# Patient Record
Sex: Female | Born: 1980 | Race: Black or African American | Hispanic: No | Marital: Married | Smoking: Never smoker
Health system: Southern US, Community
[De-identification: ages and names within clinical notes are randomized; demographics above are authoritative.]

## PROBLEM LIST (undated history)

## (undated) DIAGNOSIS — D573 Sickle-cell trait: Secondary | ICD-10-CM

## (undated) DIAGNOSIS — G43909 Migraine, unspecified, not intractable, without status migrainosus: Secondary | ICD-10-CM

## (undated) DIAGNOSIS — K589 Irritable bowel syndrome without diarrhea: Secondary | ICD-10-CM

## (undated) DIAGNOSIS — M1990 Unspecified osteoarthritis, unspecified site: Secondary | ICD-10-CM

## (undated) DIAGNOSIS — E785 Hyperlipidemia, unspecified: Secondary | ICD-10-CM

## (undated) DIAGNOSIS — E039 Hypothyroidism, unspecified: Secondary | ICD-10-CM

## (undated) DIAGNOSIS — E559 Vitamin D deficiency, unspecified: Secondary | ICD-10-CM

## (undated) DIAGNOSIS — E079 Disorder of thyroid, unspecified: Secondary | ICD-10-CM

## (undated) DIAGNOSIS — K5900 Constipation, unspecified: Secondary | ICD-10-CM

## (undated) DIAGNOSIS — E0500 Thyrotoxicosis with diffuse goiter without thyrotoxic crisis or storm: Secondary | ICD-10-CM

## (undated) DIAGNOSIS — R7303 Prediabetes: Secondary | ICD-10-CM

## (undated) HISTORY — DX: Sickle-cell trait: D57.3

## (undated) HISTORY — DX: Vitamin D deficiency, unspecified: E55.9

## (undated) HISTORY — DX: Constipation, unspecified: K59.00

## (undated) HISTORY — PX: DILATION AND EVACUATION: SHX1459

## (undated) HISTORY — DX: Irritable bowel syndrome, unspecified: K58.9

## (undated) HISTORY — DX: Disorder of thyroid, unspecified: E07.9

## (undated) HISTORY — PX: COLONOSCOPY: SHX174

## (undated) HISTORY — DX: Thyrotoxicosis with diffuse goiter without thyrotoxic crisis or storm: E05.00

## (undated) HISTORY — DX: Migraine, unspecified, not intractable, without status migrainosus: G43.909

## (undated) HISTORY — DX: Hyperlipidemia, unspecified: E78.5

---

## 2013-06-01 ENCOUNTER — Encounter: Payer: Self-pay | Admitting: Gastroenterology

## 2013-06-22 ENCOUNTER — Ambulatory Visit (INDEPENDENT_AMBULATORY_CARE_PROVIDER_SITE_OTHER): Payer: 59 | Admitting: Gastroenterology

## 2013-06-22 ENCOUNTER — Encounter: Payer: Self-pay | Admitting: Gastroenterology

## 2013-06-22 VITALS — BP 110/80 | HR 78 | Ht 64.0 in | Wt 204.0 lb

## 2013-06-22 DIAGNOSIS — K921 Melena: Secondary | ICD-10-CM

## 2013-06-22 DIAGNOSIS — K5900 Constipation, unspecified: Secondary | ICD-10-CM

## 2013-06-22 MED ORDER — LINACLOTIDE 290 MCG PO CAPS: 1.0000 | ORAL_CAPSULE | Freq: Every day | ORAL | Status: DC

## 2013-06-22 MED ORDER — PEG-KCL-NACL-NASULF-NA ASC-C 100 G PO SOLR: 1.0000 | Freq: Once | ORAL | Status: DC

## 2013-06-22 NOTE — Patient Instructions (Addendum)
Dr Russella Dar recommends that you complete a bowel purge (to clean out your bowels). Please follow instructions on Suprep sample box steps 1-4 and then wait two hours and repeat steps 1-4 again. Please be on clear liquids the day of doing the prep kit.    After finishing the bowel purge the very next day you will start Linzess samples one tablet by mouth once daily for ongoing problems with constipation. A prescription has also been sent to your pharmacy.   You have been scheduled for a colonoscopy with propofol. Please follow written instructions given to you at your visit today.  Please pick up your prep kit at the pharmacy within the next 1-3 days. If you use inhalers (even only as needed), please bring them with you on the day of your procedure. Your physician has requested that you go to www.startemmi.com and enter the access code given to you at your visit today. This web site gives a general overview about your procedure. However, you should still follow specific instructions given to you by our office regarding your preparation for the procedure.  When you go back to your PCP to get lab work done please also get a CBC and a TSH.   cc: Deatra James, MD  Thank you for choosing me and [facility].  Venita Lick. Pleas Koch., MD., Clementeen Graham

## 2013-06-22 NOTE — Progress Notes (Signed)
History of Present Illness: This is a 32 year old female who relates a lifelong history of constipation associated with generalized abdominal pain and bloating. It has been refractory to all over-the-counter laxatives for the past few years. She states that she has has tried all over-the-counter laxatives, stool softeners and fiber supplements and most recently she has been using fleets enemas intermittently but she no longer feels these are helping. Over the past 2 weeks she has had 2 small bowel movements with incomplete evacuation. She notes small amounts of bright red blood occasionally with bowel movements for several months. She states she had a colonoscopy over 10 years ago in [place] for similar symptoms and states colonoscopy was normal. She has a TSH scheduled at her primary physician's office in 2 weeks. Recent chemistry panel was negative. Denies weight loss,  diarrhea, change in stool caliber, melena, nausea, vomiting, dysphagia, reflux symptoms, chest pain.  Review of Systems: Pertinent positive and negative review of systems were noted in the above HPI section. All other review of systems were otherwise negative.  Current Medications, Allergies, Past Medical History, Past Surgical History, Family History and Social History were reviewed in [employer] record.  Physical Exam: General: Well developed , well nourished, no acute distress Head: Normocephalic and atraumatic Eyes:  sclerae anicteric, EOMI Ears: Normal auditory acuity Mouth: No deformity or lesions Neck: Supple, no masses or thyromegaly Lungs: Clear throughout to auscultation Heart: Regular rate and rhythm; no murmurs, rubs or bruits Abdomen: Soft, generalized mild tenderness and non distended. No masses, hepatosplenomegaly or hernias noted. Normal Bowel sounds Rectal: Small external tags no internal lesions, no tenderness, Hemoccult negative brown stool the vault Musculoskeletal: Symmetrical with no  gross deformities  Skin: No lesions on visible extremities Pulses:  Normal pulses noted Extremities: No clubbing, cyanosis, edema or deformities noted Neurological: Alert oriented x 4, grossly nonfocal Cervical Nodes:  No significant cervical adenopathy Inguinal Nodes: No significant inguinal adenopathy Psychological:  Alert and cooperative. Normal mood and affect  Assessment and Recommendations:  1. Chronic constipation with intermittent small-volume hematochezia. Constipation is refractory to over-the-counter laxatives. Obtain a CBC and TSH from her PCPs office. Bowel purge with Suprep and then begin Linzess daily. Schedule colonoscopy. The risks, benefits, and alternatives to colonoscopy with possible biopsy and possible polypectomy were discussed with the patient and they consent to proceed.

## 2013-07-20 ENCOUNTER — Encounter: Payer: Self-pay | Admitting: Gastroenterology

## 2013-07-20 ENCOUNTER — Ambulatory Visit (AMBULATORY_SURGERY_CENTER): Payer: 59 | Admitting: Gastroenterology

## 2013-07-20 VITALS — BP 115/77 | HR 58 | Temp 98.0°F | Resp 19 | Ht 64.0 in | Wt 204.0 lb

## 2013-07-20 DIAGNOSIS — K921 Melena: Secondary | ICD-10-CM

## 2013-07-20 DIAGNOSIS — K5900 Constipation, unspecified: Secondary | ICD-10-CM

## 2013-07-20 MED ORDER — SODIUM CHLORIDE 0.9 % IV SOLN: 500.0000 mL | INTRAVENOUS | Status: DC

## 2013-07-20 NOTE — Progress Notes (Signed)
Procedure ends, to recovery, report given and VSS. 

## 2013-07-20 NOTE — Op Note (Signed)
 [facility] [address] [place], [address]   COLONOSCOPY PROCEDURE REPORT  PATIENT: Susan Shields, Susan Shields  MR#: [id number] BIRTHDATE: [birth year] , 32  yrs. old GENDER: Female ENDOSCOPIST: Meryl Dare, MD, [facility] REFERRED [id number] Wynelle Link, M.D. PROCEDURE DATE:  07/20/2013 PROCEDURE:   Colonoscopy, diagnostic ASA CLASS:   Class II INDICATIONS:hematochezia and Constipation. MEDICATIONS: MAC sedation, administered by CRNA and propofol (Diprivan) 300mg  IV DESCRIPTION OF PROCEDURE:   After the risks benefits and alternatives of the procedure were thoroughly explained, informed consent was obtained.  A digital rectal exam revealed small external hemorrhoids.   The LB [id number]  endoscope was introduced through the anus and advanced to the cecum, which was identified by both the appendix and ileocecal valve. No adverse events experienced.   The quality of the prep was good, using MoviPrep  The instrument was then slowly withdrawn as the colon was fully examined.  COLON FINDINGS: A normal appearing cecum, ileocecal valve, and appendiceal orifice were identified.  The ascending, hepatic flexure, transverse, splenic flexure, descending, sigmoid colon and rectum appeared unremarkable.  No polyps or cancers were seen. Retroflexed views revealed no abnormalities. The time to cecum=2 minutes 01 seconds.  Withdrawal time=9 minutes 18 seconds.  The scope was withdrawn and the procedure completed.  COMPLICATIONS: There were no complications.  ENDOSCOPIC IMPRESSION: 1.  Normal colon 2.  Small external hemorrhoids  RECOMMENDATIONS: 1.  Continue current medications 2.  Continue current colorectal screening recommendations for "routine risk" patients with a repeat colonoscopy at age 27 years.   eSigned:  Meryl Dare, MD, [facility] 07/20/2013 4:08 PM

## 2013-07-20 NOTE — Progress Notes (Signed)
Patient did not experience any of the following events: a burn prior to discharge; a fall within the facility; wrong site/side/patient/procedure/implant event; or a hospital transfer or hospital admission upon discharge from the facility. (G8907) Patient did not have preoperative order for IV antibiotic SSI prophylaxis. (G8918)  

## 2013-07-20 NOTE — Patient Instructions (Signed)

## 2013-07-22 ENCOUNTER — Telehealth: Payer: Self-pay | Admitting: *Deleted

## 2013-07-22 NOTE — Telephone Encounter (Signed)
  Follow up Call-  Call back number 07/20/2013  Post procedure Call Back phone  # [phone]  Permission to leave phone message No     Patient questions:  Do you have a fever, pain , or abdominal swelling? no Pain Score  0 *  Have you tolerated food without any problems? yes  Have you been able to return to your normal activities? yes  Do you have any questions about your discharge instructions: Diet   no Medications  no Follow up visit  no  Do you have questions or concerns about your Care? no  Actions: * If pain score is 4 or above: No action needed, pain <4.

## 2014-04-05 ENCOUNTER — Telehealth: Payer: Self-pay | Admitting: Gastroenterology

## 2014-04-05 MED ORDER — MOVIPREP 100 G PO SOLR: 1.0000 | Freq: Once | ORAL | Status: DC

## 2014-04-05 NOTE — Telephone Encounter (Signed)
Patient has ongoing constipation since last year. Patient did stop taking her Linzess because her constipation was improving and now has been taking Milk of Magnesia and enemas when needed. Patient states over the weekend she had a bad spell of constipation and tried using her usually milk of magnesia and still did not have a bowel movement. She also a tried a enema and patient states the enema came right back out of her rectum. Patient states she would like the medication that helped clean out her bowels last year and restart Linzess. I told patient that I would ask Dr. Russella DarStark if we can send a bowel purge to her pharmacy along with Linzess. Please advise.

## 2014-04-05 NOTE — Telephone Encounter (Signed)
I have spoken to patient to advise of Dr Ardell IsaacsStark's recommendations. She verbalizes understanding and will actually come pick up a Moviprep sample. She will follow instructions on box. She will also resume Linzess (which she already has on hand).

## 2014-04-05 NOTE — Telephone Encounter (Signed)
Attempted to call patient and line was busy.

## 2014-04-05 NOTE — Telephone Encounter (Signed)
Bowel purge with split dose Miralax prep or MoviPrep and clear liquids for the day.  Can resume Linzess at prior dose for 6 months.  REV in 6 months if doing well. Call for earlier appt if not doing well.

## 2014-05-08 ENCOUNTER — Other Ambulatory Visit: Payer: Self-pay | Admitting: Gastroenterology

## 2014-06-19 ENCOUNTER — Telehealth: Payer: Self-pay | Admitting: Gastroenterology

## 2014-06-19 MED ORDER — LINZESS 290 MCG PO CAPS: ORAL_CAPSULE | ORAL | Status: DC

## 2014-06-19 NOTE — Telephone Encounter (Signed)
Patient states she needs a refill of her Linzess and will call back and make an appt. Told patient she is due for any appt but she can call back to schedule. Prescription sent to pharmacy.

## 2014-07-27 ENCOUNTER — Encounter: Payer: Self-pay | Admitting: Gastroenterology

## 2014-10-11 ENCOUNTER — Encounter: Payer: Self-pay | Admitting: Gastroenterology

## 2014-10-11 ENCOUNTER — Ambulatory Visit (INDEPENDENT_AMBULATORY_CARE_PROVIDER_SITE_OTHER): Payer: 59 | Admitting: Gastroenterology

## 2014-10-11 VITALS — BP 110/82 | HR 76 | Ht 64.0 in | Wt 206.4 lb

## 2014-10-11 DIAGNOSIS — K5901 Slow transit constipation: Secondary | ICD-10-CM | POA: Insufficient documentation

## 2014-10-11 MED ORDER — LINZESS 290 MCG PO CAPS: ORAL_CAPSULE | ORAL | Status: DC

## 2014-10-11 NOTE — Progress Notes (Signed)
    History of Present Illness: This is a 33 year old female returning for followup of constipation. Her symptoms are generally controlled with Linzess however she has occasional episodes of refractory constipation and uses enemas intermittently. Colonoscopy performed last year was normal except for small external hemorrhoids.  Current Medications, Allergies, Past Medical History, Past Surgical History, Family History and Social History were reviewed in [facility].  Physical Exam: General: Well developed , well nourished, no acute distress Head: Normocephalic and atraumatic Eyes:  sclerae anicteric, EOMI Ears: Normal auditory acuity Mouth: No deformity or lesions Lungs: Clear throughout to auscultation Heart: Regular rate and rhythm; no murmurs, rubs or bruits Abdomen: Soft, non tender and non distended. No masses, hepatosplenomegaly or hernias noted. Normal Bowel sounds Musculoskeletal: Symmetrical with no gross deformities  Pulses:  Normal pulses noted Extremities: No clubbing, cyanosis, edema or deformities noted Neurological: Alert oriented x 4, grossly nonfocal Psychological:  Alert and cooperative. Normal mood and affect  Assessment and Recommendations:  1. Constipation. Continue Linzess 290 mcg daily. Trial of MiraLax 3-4 times a day refractory constipation instead of enemas. REV in one year.

## 2014-10-11 NOTE — Patient Instructions (Addendum)
We have sent the following medications to your pharmacy for you to pick up at your convenience: Linzess You may use miralax up to 4 times daily as needed. Please follow up with Dr Russella DarStark in 1 year.  CC:  Deatra JamesVyvyan Sun MD

## 2015-02-16 ENCOUNTER — Emergency Department (HOSPITAL_COMMUNITY)
Admission: EM | Admit: 2015-02-16 | Discharge: 2015-02-16 | Disposition: A | Discharge status: Home / Self Care | Payer: 59 | Source: Home / Self Care | Attending: Family Medicine | Admitting: Family Medicine

## 2015-02-16 ENCOUNTER — Encounter (HOSPITAL_COMMUNITY): Payer: Self-pay | Admitting: Emergency Medicine

## 2015-02-16 DIAGNOSIS — J329 Chronic sinusitis, unspecified: Secondary | ICD-10-CM

## 2015-02-16 DIAGNOSIS — J029 Acute pharyngitis, unspecified: Secondary | ICD-10-CM | POA: Diagnosis not present

## 2015-02-16 DIAGNOSIS — B9789 Other viral agents as the cause of diseases classified elsewhere: Secondary | ICD-10-CM

## 2015-02-16 DIAGNOSIS — B349 Viral infection, unspecified: Secondary | ICD-10-CM | POA: Diagnosis not present

## 2015-02-16 LAB — POCT RAPID STREP A: STREPTOCOCCUS, GROUP A SCREEN (DIRECT): NEGATIVE

## 2015-02-16 NOTE — Discharge Instructions (Signed)
Sinusitis °Sinusitis is redness, soreness, and inflammation of the paranasal sinuses. Paranasal sinuses are air pockets within the bones of your face (beneath the eyes, the middle of the forehead, or above the eyes). In healthy paranasal sinuses, mucus is able to drain out, and air is able to circulate through them by way of your nose. However, when your paranasal sinuses are inflamed, mucus and air can become trapped. This can allow bacteria and other germs to grow and cause infection. °Sinusitis can develop quickly and last only a short time (acute) or continue over a long period (chronic). Sinusitis that lasts for more than 12 weeks is considered chronic.  °CAUSES  °Causes of sinusitis include: °· Allergies. °· Structural abnormalities, such as displacement of the cartilage that separates your nostrils (deviated septum), which can decrease the air flow through your nose and sinuses and affect sinus drainage. °· Functional abnormalities, such as when the small hairs (cilia) that line your sinuses and help remove mucus do not work properly or are not present. °SIGNS AND SYMPTOMS  °Symptoms of acute and chronic sinusitis are the same. The primary symptoms are pain and pressure around the affected sinuses. Other symptoms include: °· Upper toothache. °· Earache. °· Headache. °· Bad breath. °· Decreased sense of smell and taste. °· A cough, which worsens when you are lying flat. °· Fatigue. °· Fever. °· Thick drainage from your nose, which often is green and may contain pus (purulent). °· Swelling and warmth over the affected sinuses. °DIAGNOSIS  °Your health care provider will perform a physical exam. During the exam, your health care provider may: °· Look in your nose for signs of abnormal growths in your nostrils (nasal polyps). °· Tap over the affected sinus to check for signs of infection. °· View the inside of your sinuses (endoscopy) using an imaging device that has a light attached (endoscope). °If your health  care provider suspects that you have chronic sinusitis, one or more of the following tests may be recommended: °· Allergy tests. °· Nasal culture. A sample of mucus is taken from your nose, sent to a lab, and screened for bacteria. °· Nasal cytology. A sample of mucus is taken from your nose and examined by your health care provider to determine if your sinusitis is related to an allergy. °TREATMENT  °Most cases of acute sinusitis are related to a viral infection and will resolve on their own within 10 days. Sometimes medicines are prescribed to help relieve symptoms (pain medicine, decongestants, nasal steroid sprays, or saline sprays).  °However, for sinusitis related to a bacterial infection, your health care provider will prescribe antibiotic medicines. These are medicines that will help kill the bacteria causing the infection.  °Rarely, sinusitis is caused by a fungal infection. In theses cases, your health care provider will prescribe antifungal medicine. °For some cases of chronic sinusitis, surgery is needed. Generally, these are cases in which sinusitis recurs more than 3 times per year, despite other treatments. °HOME CARE INSTRUCTIONS  °· Drink plenty of water. Water helps thin the mucus so your sinuses can drain more easily. °· Use a humidifier. °· Inhale steam 3 to 4 times a day (for example, sit in the bathroom with the shower running). °· Apply a warm, moist washcloth to your face 3 to 4 times a day, or as directed by your health care provider. °· Use saline nasal sprays to help moisten and clean your sinuses. °· Take medicines only as directed by your health care provider. °·   If you were prescribed either an antibiotic or antifungal medicine, finish it all even if you start to feel better. SEEK IMMEDIATE MEDICAL CARE IF:  You have increasing pain or severe headaches.  You have nausea, vomiting, or drowsiness.  You have swelling around your face.  You have vision problems.  You have a stiff  neck.  You have difficulty breathing. MAKE SURE YOU:   Understand these instructions.  Will watch your condition.  Will get help right away if you are not doing well or get worse. Document Released: 12/15/2005 Document Revised: 05/01/2014 Document Reviewed: 12/30/2011 [facility] Patient Information 2015 [place], [place]. This information is not intended to replace advice given to you by your health care provider. Make sure you discuss any questions you have with your health care provider.  Sore Throat A sore throat is pain, burning, irritation, or scratchiness of the throat. There is often pain or tenderness when swallowing or talking. A sore throat may be accompanied by other symptoms, such as coughing, sneezing, fever, and swollen neck glands. A sore throat is often the first sign of another sickness, such as a cold, flu, strep throat, or mononucleosis (commonly known as mono). Most sore throats go away without medical treatment. CAUSES  The most common causes of a sore throat include:  A viral infection, such as a cold, flu, or mono.  A bacterial infection, such as strep throat, tonsillitis, or whooping cough.  Seasonal allergies.  Dryness in the air.  Irritants, such as smoke or pollution.  Gastroesophageal reflux disease (GERD). HOME CARE INSTRUCTIONS   Only take over-the-counter medicines as directed by your caregiver.  Drink enough fluids to keep your urine clear or pale yellow.  Rest as needed.  Try using throat sprays, lozenges, or sucking on hard candy to ease any pain (if older than 4 years or as directed).  Sip warm liquids, such as broth, herbal tea, or warm water with honey to relieve pain temporarily. You may also eat or drink cold or frozen liquids such as frozen ice pops.  Gargle with salt water (mix 1 tsp salt with 8 oz of water).  Do not smoke and avoid secondhand smoke.  Put a cool-mist humidifier in your bedroom at night to moisten the air. You can also  turn on a hot shower and sit in the bathroom with the door closed for 5-10 minutes. SEEK IMMEDIATE MEDICAL CARE IF:  You have difficulty breathing.  You are unable to swallow fluids, soft foods, or your saliva.  You have increased swelling in the throat.  Your sore throat does not get better in 7 days.  You have nausea and vomiting.  You have a fever or persistent symptoms for more than 2-3 days.  You have a fever and your symptoms suddenly get worse. MAKE SURE YOU:   Understand these instructions.  Will watch your condition.  Will get help right away if you are not doing well or get worse. Document Released: 01/22/2005 Document Revised: 12/01/2012 Document Reviewed: 08/22/2012 [facility] Patient Information 2015 [place], [place]. This information is not intended to replace advice given to you by your health care provider. Make sure you discuss any questions you have with your health care provider.

## 2015-02-16 NOTE — ED Notes (Signed)
Pt states that she has had a sore throat since 02/14/2015 pt states it feels like she is swallowing glass.

## 2015-02-16 NOTE — ED Provider Notes (Signed)
CSN: [id number]     Arrival date & time 02/16/15  1454 History   First MD Initiated Contact with Patient 02/16/15 1518     Chief Complaint  Patient presents with  . Sore Throat   (Consider location/radiation/quality/duration/timing/severity/associated sxs/prior Treatment) HPI    34 year old female presents complaining of sore throat and nasal congestion. This started 2 days ago. She says the symptoms are severe. No cough, chest pain, shortness of breath, NVD. No recent travel or sick contacts. No fever. Over-the-counter medications are not helping.  Past Medical History  Diagnosis Date  . Thyroid disease   . IBS (irritable bowel syndrome)   . Hyperlipidemia    Past Surgical History  Procedure Laterality Date  . Cesarean section     Family History  Problem Relation Age of Onset  . Diabetes Mother   . Throat cancer Father   . Cancer Mother     MMT Cancer   History  Substance Use Topics  . Smoking status: Never Smoker   . Smokeless tobacco: Never Used  . Alcohol Use: Yes     Comment: occasional   OB History    No data available     Review of Systems  HENT: Positive for sinus pressure and sore throat.   All other systems reviewed and are negative.   Allergies  Review of patient's allergies indicates no known allergies.  Home Medications   Prior to Admission medications   Medication Sig Start Date End Date Taking? Authorizing Provider  levothyroxine (SYNTHROID, LEVOTHROID) 125 MCG tablet Take 125 mcg by mouth daily before breakfast.   Yes Historical Provider, MD  LINZESS 290 MCG CAPS capsule TAKE 1 CAPSULE BY MOUTH DAILY. 10/11/14   Meryl DareMalcolm T Stark, MD   BP 135/90 mmHg  Pulse 67  Temp(Src) 97.7 F (36.5 C) (Oral)  Resp 16  SpO2 99%  LMP 02/16/2015 Physical Exam  Constitutional: She is oriented to person, place, and time. Vital signs are normal. She appears well-developed and well-nourished. No distress.  HENT:  Head: Normocephalic and atraumatic.  Right Ear:  External ear normal.  Left Ear: External ear normal.  Nose: Nose normal.  Mouth/Throat: Oropharynx is clear and moist. No oropharyngeal exudate.  Eyes: Conjunctivae are normal.  Cardiovascular: Normal rate, regular rhythm and normal heart sounds.   Pulmonary/Chest: Effort normal and breath sounds normal. No respiratory distress.  Neurological: She is alert and oriented to person, place, and time. She has normal strength. Coordination normal.  Skin: Skin is warm and dry. No rash noted. She is not diaphoretic.  Psychiatric: She has a normal mood and affect. Judgment normal.  Nursing note and vitals reviewed.   ED Course  Procedures (including critical care time) Labs Review Labs Reviewed  CULTURE, GROUP A STREP  POCT RAPID STREP A ([facility] URG CARE ONLY)    Imaging Review No results found.   MDM   1. Sore throat   2. Viral sinusitis    Rapid strep negative.  Treat symptomatically, f/u PRN     Graylon GoodZachary H Cayleen Benjamin, PA-C 02/16/15 1950

## 2015-02-19 LAB — CULTURE, GROUP A STREP: Strep A Culture: NEGATIVE

## 2015-10-30 ENCOUNTER — Encounter: Payer: Self-pay | Admitting: Emergency Medicine

## 2015-10-30 ENCOUNTER — Ambulatory Visit
Admission: EM | Admit: 2015-10-30 | Discharge: 2015-10-30 | Disposition: A | Discharge status: Home / Self Care | Payer: 59 | Attending: Family Medicine | Admitting: Family Medicine

## 2015-10-30 DIAGNOSIS — L03115 Cellulitis of right lower limb: Secondary | ICD-10-CM

## 2015-10-30 MED ORDER — SULFAMETHOXAZOLE-TRIMETHOPRIM 800-160 MG PO TABS: 1.0000 | ORAL_TABLET | Freq: Two times a day (BID) | ORAL | Status: DC

## 2015-10-30 MED ORDER — CEFAZOLIN SODIUM 1 G IJ SOLR
1.0000 g | Freq: Once | INTRAMUSCULAR | Status: AC
  Administered 2015-10-30: 1 g via INTRAMUSCULAR

## 2015-10-30 NOTE — ED Notes (Signed)
Patient c/o tenderness and drainage from the site on her Right lower leg since Thursday.

## 2015-10-30 NOTE — ED Provider Notes (Signed)
CSN: [id number]     Arrival date & time 10/30/15  1838 History   First MD Initiated Contact with Patient 10/30/15 1904     Chief Complaint  Patient presents with  . Insect Bite   (Consider location/radiation/quality/duration/timing/severity/associated sxs/prior Treatment) HPI Comments: 34 yo female with a 5 day h/o progressively worsening redness and tenderness to her right lower leg (shin) after possible insect bite. States the night before symptoms began she felt something on her leg. Has had some chills.   The history is provided by the patient.    Past Medical History  Diagnosis Date  . Thyroid disease   . IBS (irritable bowel syndrome)   . Hyperlipidemia    Past Surgical History  Procedure Laterality Date  . Cesarean section     Family History  Problem Relation Age of Onset  . Diabetes Mother   . Throat cancer Father   . Cancer Mother     MMT Cancer   Social History  Substance Use Topics  . Smoking status: Never Smoker   . Smokeless tobacco: Never Used  . Alcohol Use: Yes     Comment: occasional   OB History    No data available     Review of Systems  Allergies  Review of patient's allergies indicates no known allergies.  Home Medications   Prior to Admission medications   Medication Sig Start Date End Date Taking? Authorizing Provider  levothyroxine (SYNTHROID, LEVOTHROID) 125 MCG tablet Take 125 mcg by mouth daily before breakfast.    Historical Provider, MD  LINZESS 290 MCG CAPS capsule TAKE 1 CAPSULE BY MOUTH DAILY. 10/11/14   Meryl Dare, MD  sulfamethoxazole-trimethoprim (BACTRIM DS,SEPTRA DS) 800-160 MG tablet Take 1 tablet by mouth 2 (two) times daily. 10/30/15   Payton Mccallum, MD   Meds Ordered and Administered this Visit   Medications  ceFAZolin (ANCEF) injection 1 g (1 g Intramuscular Given 10/30/15 1926)    BP 121/82 mmHg  Pulse 89  Temp(Src) 99.9 F (37.7 C) (Tympanic)  Resp 17  Ht  (1.6 m)  Wt 212 lb (96.163 kg)  BMI 37.56  kg/m2  LMP 10/30/2015 (Exact Date) No data found.   Physical Exam  Constitutional: She appears well-developed and well-nourished. No distress.  Musculoskeletal:  Right lower extremity (mid shin area) with warmth, blanchable erythema and tenderness to palpation; central blister/ulceration with mild drainage  Skin: She is not diaphoretic.  Nursing note and vitals reviewed.   ED Course  Procedures (including critical care time)  Labs Review Labs Reviewed  CULTURE, ROUTINE-ABSCESS    Imaging Review No results found.   Visual Acuity Review  Right Eye Distance:   Left Eye Distance:   Bilateral Distance:    Right Eye Near:   Left Eye Near:    Bilateral Near:         MDM   1. Cellulitis of right leg    Discharge Medication List as of 10/30/2015  7:50 PM    START taking these medications   Details  sulfamethoxazole-trimethoprim (BACTRIM DS,SEPTRA DS) 800-160 MG tablet Take 1 tablet by mouth 2 (two) times daily., Starting 10/30/2015, Until Discontinued, Print      1. diagnosis reviewed with patient/parent/guardian/family 2. rx as per orders above; reviewed possible side effects, interactions, risks and benefits  3. Recommend supportive treatment with warm compresses to area and leg elevation 4. Patient given Ancef 1gm IM x 1  4. Follow prn if symptoms worsen or don't improve  Payton Mccallumrlando Lyndsie Wallman, MD 10/30/15 2104

## 2015-10-30 NOTE — Discharge Instructions (Signed)

## 2015-11-02 LAB — CULTURE, ROUTINE-ABSCESS: SPECIAL REQUESTS: NORMAL

## 2015-11-02 NOTE — ED Notes (Signed)
Final report of wound culture positive for staph aureus , sensitive to Rx [facility] [place]. Called and discussed w patient, instructed to complete Rx as written, continue w warm compresses, and to follow up w her PCP if develops new problems

## 2015-11-15 ENCOUNTER — Other Ambulatory Visit: Payer: Self-pay | Admitting: Gastroenterology

## 2015-11-15 ENCOUNTER — Telehealth: Payer: Self-pay | Admitting: Gastroenterology

## 2015-11-15 NOTE — Telephone Encounter (Signed)
Informed patient that I sent a refill to her pharmacy earlier today. Pt verbalized understanding.

## 2015-11-15 NOTE — Telephone Encounter (Signed)
Prescription was already refilled to her pharmacy this morning. Called patient and left a message for her to return my call.

## 2015-12-07 ENCOUNTER — Encounter: Payer: Self-pay | Admitting: *Deleted

## 2015-12-07 ENCOUNTER — Ambulatory Visit
Admission: EM | Admit: 2015-12-07 | Discharge: 2015-12-07 | Disposition: A | Discharge status: Home / Self Care | Payer: 59 | Attending: Family Medicine | Admitting: Family Medicine

## 2015-12-07 DIAGNOSIS — J020 Streptococcal pharyngitis: Secondary | ICD-10-CM | POA: Diagnosis not present

## 2015-12-07 LAB — RAPID STREP SCREEN (MED CTR MEBANE ONLY): Streptococcus, Group A Screen (Direct): POSITIVE — AB

## 2015-12-07 MED ORDER — PENICILLIN G BENZATHINE 1200000 UNIT/2ML IM SUSP
1.2000 10*6.[IU] | Freq: Once | INTRAMUSCULAR | Status: AC
  Administered 2015-12-07: 1.2 10*6.[IU] via INTRAMUSCULAR

## 2015-12-07 NOTE — ED Provider Notes (Signed)
CSN: [id number]     Arrival date & time 12/07/15  1801 History   First MD Initiated Contact with Patient 12/07/15 2005     Chief Complaint  Patient presents with  . Cough  . Tinnitus    right ear  . Nasal Congestion   (Consider location/radiation/quality/duration/timing/severity/associated sxs/prior Treatment) HPI  34 year old female who presents with a sore throat felt like she was swallowing glass as started 3 days ago. He says progressed to congestion and cough and she is very reluctant to cough because of the sore throat pain. He tried over-the-counter medications and has not been helpful. States that she has not been febrile and his had not had chills. The cough is not productive and that she is resisting the cough is much as possible. Plain of some rattling sensation in her chest however. Her ears are also very congested  Past Medical History  Diagnosis Date  . Thyroid disease   . IBS (irritable bowel syndrome)   . Hyperlipidemia    Past Surgical History  Procedure Laterality Date  . Cesarean section     Family History  Problem Relation Age of Onset  . Diabetes Mother   . Throat cancer Father   . Cancer Mother     MMT Cancer   Social History  Substance Use Topics  . Smoking status: Never Smoker   . Smokeless tobacco: Never Used  . Alcohol Use: Yes     Comment: occasional   OB History    No data available     Review of Systems  Constitutional: Positive for activity change. Negative for fever, chills and fatigue.  HENT: Positive for congestion, ear pain, hearing loss, postnasal drip, sinus pressure, sore throat, trouble swallowing and voice change.   Respiratory: Positive for cough.   All other systems reviewed and are negative.   Allergies  Review of patient's allergies indicates no known allergies.  Home Medications   Prior to Admission medications   Medication Sig Start Date End Date Taking? Authorizing Provider  levothyroxine (SYNTHROID, LEVOTHROID) 125  MCG tablet Take 125 mcg by mouth daily before breakfast.   Yes Historical Provider, MD  LINZESS 290 MCG CAPS capsule TAKE 1 CAPSULE BY MOUTH DAILY. 11/15/15  Yes Meryl Dare, MD  LINZESS 290 MCG CAPS capsule TAKE 1 CAPSULE BY MOUTH DAILY. 11/15/15   Meryl Dare, MD  sulfamethoxazole-trimethoprim (BACTRIM DS,SEPTRA DS) 800-160 MG tablet Take 1 tablet by mouth 2 (two) times daily. 10/30/15   Payton Mccallum, MD   Meds Ordered and Administered this Visit   Medications  penicillin g benzathine (BICILLIN LA) 1200000 UNIT/2ML injection 1.2 Million Units (1.2 Million Units Intramuscular Given 12/07/15 2104)    BP 131/93 mmHg  Pulse 74  Temp(Src) 98.5 F (36.9 C) (Oral)  Resp 20  Ht  (1.6 m)  Wt 210 lb (95.255 kg)  BMI 37.21 kg/m2  SpO2 100%  LMP 11/28/2015 No data found.   Physical Exam  Constitutional: She is oriented to person, place, and time. She appears well-developed and well-nourished. No distress.  HENT:  Head: Normocephalic and atraumatic.  Left Ear: External ear normal.  Nose: Nose normal.  Mouth/Throat: Oropharynx is clear and moist. No oropharyngeal exudate.  Years dull with an air-fluid level present.  Eyes: Conjunctivae are normal. Pupils are equal, round, and reactive to light.  Neck: Normal range of motion. Neck supple.  Pulmonary/Chest: Breath sounds normal. No respiratory distress. She has no wheezes. She has no rales.  Musculoskeletal: Normal range  of motion. She exhibits no edema or tenderness.  Lymphadenopathy:    She has no cervical adenopathy.  Neurological: She is alert and oriented to person, place, and time.  Skin: Skin is warm and dry. She is not diaphoretic.  Psychiatric: She has a normal mood and affect. Her behavior is normal. Judgment and thought content normal.  Nursing note and vitals reviewed.   ED Course  Procedures (including critical care time)  Labs Review Labs Reviewed  RAPID STREP SCREEN (NOT AT [facility]) - Abnormal; Notable for  the following:    Streptococcus, Group A Screen (Direct) POSITIVE (*)    All other components within normal limits    Imaging Review No results found.   Visual Acuity Review  Right Eye Distance:   Left Eye Distance:   Bilateral Distance:    Right Eye Near:   Left Eye Near:    Bilateral Near:      21:04 Medication Given [facility]  penicillin g benzathine (BICILLIN LA) 1200000 UNIT/2ML injection 1.2 Million Units - Dose: 1.2 Million Units ; Route: Intramuscular ; Site: Right Upper Outer Quadrant ; Scheduled Time: 2100        MDM   1. Streptococcal pharyngitis    Plan: 1. Test/x-ray results and diagnosis reviewed with patient 2. rx as per orders; risks, benefits, potential side effects reviewed with patient 3. Recommend supportive treatment with fluids and rest. Gargle with salt water as necessary use lozenges as necessary 4. F/u prn if symptoms worsen or don't improve     Lutricia FeilWilliam P Samarra Ridgely, PA-C 12/07/15 2128  Lutricia FeilWilliam P Jisselle Poth, PA-C 12/07/15 2128

## 2015-12-07 NOTE — ED Notes (Signed)
Patient started having symptoms Tuesday of sore throat and has progressed to congestion and cough. Patient reports extremely painful cough and tries not to cough. OTC medications have not resolved symptoms.

## 2015-12-07 NOTE — Discharge Instructions (Signed)
Rapid Strep Test Strep throat is a bacterial infection caused by the bacteria Streptococcus pyogenes. A rapid strep test is the quickest way to check if these bacteria are causing your sore throat. The test can be done at your health care provider's office. Results are usually ready in 10-20 minutes. You may have this test if you have symptoms of strep throat. These include:   A red throat with yellow or white spots.  Neck swelling and tenderness.  Fever.  Loss of appetite.  Trouble breathing or swallowing.  Rash.  Dehydration. This test requires a sample of fluid from the back of your throat and tonsils. Your health care provider may hold down your tongue with a tongue depressor and use a swab to collect the sample.  Your health care provider may collect a second sample at the same time. The second sample may be used for a throat culture. In a culture test, the sample is combined with a substance that encourages bacteria to grow. It takes longer to get the results of the throat culture test, but they are more accurate. They can confirm the results from a rapid strep test, or show that those results were wrong. RESULTS  It is your responsibility to obtain your test results. Ask the lab or department performing the test when and how you will get your results. Contact your health care provider to discuss any questions you have about your results.  The results of the rapid strep test will be negative or positive.  Meaning of Negative Test Results If the result of your rapid strep test is negative, then it means:   It is likely that you do not have strep throat.  A virus may be causing your sore throat. Your health care provider may do a throat culture to confirm the results of the rapid strep test. The throat culture can also identify the different strains of strep bacteria. Meaning of Positive Test Results If the result of your rapid strep test is positive, then it means:  It is likely  that you do have strep throat.  You may have to take antibiotics. Your health care provider may do a throat culture to confirm the results of the rapid strep test. Strep throat usually requires a course of antibiotics.    This information is not intended to replace advice given to you by your health care provider. Make sure you discuss any questions you have with your health care provider.   Document Released: 01/22/2005 Document Revised: 01/05/2015 Document Reviewed: 03/23/2014 Elsevier Interactive Patient Education 2016 Elsevier Inc.  Pharyngitis Pharyngitis is redness, pain, and swelling (inflammation) of your pharynx.  CAUSES  Pharyngitis is usually caused by infection. Most of the time, these infections are from viruses (viral) and are part of a cold. However, sometimes pharyngitis is caused by bacteria (bacterial). Pharyngitis can also be caused by allergies. Viral pharyngitis may be spread from person to person by coughing, sneezing, and personal items or utensils (cups, forks, spoons, toothbrushes). Bacterial pharyngitis may be spread from person to person by more intimate contact, such as kissing.  SIGNS AND SYMPTOMS  Symptoms of pharyngitis include:   Sore throat.   Tiredness (fatigue).   Low-grade fever.   Headache.  Joint pain and muscle aches.  Skin rashes.  Swollen lymph nodes.  Plaque-like film on throat or tonsils (often seen with bacterial pharyngitis). DIAGNOSIS  Your health care provider will ask you questions about your illness and your symptoms. Your medical history, along with  a physical exam, is often all that is needed to diagnose pharyngitis. Sometimes, a rapid strep test is done. Other lab tests may also be done, depending on the suspected cause.  TREATMENT  Viral pharyngitis will usually get better in 3-4 days without the use of medicine. Bacterial pharyngitis is treated with medicines that kill germs (antibiotics).  HOME CARE INSTRUCTIONS   Drink  enough water and fluids to keep your urine clear or pale yellow.   Only take over-the-counter or prescription medicines as directed by your health care provider:   If you are prescribed antibiotics, make sure you finish them even if you start to feel better.   Do not take aspirin.   Get lots of rest.   Gargle with 8 oz of salt water ( tsp of salt per 1 qt of water) as often as every 1-2 hours to soothe your throat.   Throat lozenges (if you are not at risk for choking) or sprays may be used to soothe your throat. SEEK MEDICAL CARE IF:   You have large, tender lumps in your neck.  You have a rash.  You cough up green, yellow-brown, or bloody spit. SEEK IMMEDIATE MEDICAL CARE IF:   Your neck becomes stiff.  You drool or are unable to swallow liquids.  You vomit or are unable to keep medicines or liquids down.  You have severe pain that does not go away with the use of recommended medicines.  You have trouble breathing (not caused by a stuffy nose). MAKE SURE YOU:   Understand these instructions.  Will watch your condition.  Will get help right away if you are not doing well or get worse.   This information is not intended to replace advice given to you by your health care provider. Make sure you discuss any questions you have with your health care provider.   Document Released: 12/15/2005 Document Revised: 10/05/2013 Document Reviewed: 08/22/2013 Elsevier Interactive Patient Education 2016 Elsevier Inc.  Strep Throat Strep throat is a bacterial infection of the throat. Your health care provider may call the infection tonsillitis or pharyngitis, depending on whether there is swelling in the tonsils or at the back of the throat. Strep throat is most common during the cold months of the year in children who are 525-35 years of age, but it can happen during any season in people of any age. This infection is spread from person to person (contagious) through coughing,  sneezing, or close contact. CAUSES Strep throat is caused by the bacteria called Streptococcus pyogenes. RISK FACTORS This condition is more likely to develop in:  People who spend time in crowded places where the infection can spread easily.  People who have close contact with someone who has strep throat. SYMPTOMS Symptoms of this condition include:  Fever or chills.   Redness, swelling, or pain in the tonsils or throat.  Pain or difficulty when swallowing.  White or yellow spots on the tonsils or throat.  Swollen, tender glands in the neck or under the jaw.  Red rash all over the body (rare). DIAGNOSIS This condition is diagnosed by performing a rapid strep test or by taking a swab of your throat (throat culture test). Results from a rapid strep test are usually ready in a few minutes, but throat culture test results are available after one or two days. TREATMENT This condition is treated with antibiotic medicine. HOME CARE INSTRUCTIONS Medicines  Take over-the-counter and prescription medicines only as told by your health care provider.  Take your antibiotic as told by your health care provider. Do not stop taking the antibiotic even if you start to feel better.  Have family members who also have a sore throat or fever tested for strep throat. They may need antibiotics if they have the strep infection. Eating and Drinking  Do not share food, drinking cups, or personal items that could cause the infection to spread to other people.  If swallowing is difficult, try eating soft foods until your sore throat feels better.  Drink enough fluid to keep your urine clear or pale yellow. General Instructions  Gargle with a salt-water mixture 3-4 times per day or as needed. To make a salt-water mixture, completely dissolve -1 tsp of salt in 1 cup of warm water.  Make sure that all household members wash their hands well.  Get plenty of rest.  Stay home from school or work  until you have been taking antibiotics for 24 hours.  Keep all follow-up visits as told by your health care provider. This is important. SEEK MEDICAL CARE IF:  The glands in your neck continue to get bigger.  You develop a rash, cough, or earache.  You cough up a thick liquid that is green, yellow-brown, or bloody.  You have pain or discomfort that does not get better with medicine.  Your problems seem to be getting worse rather than better.  You have a fever. SEEK IMMEDIATE MEDICAL CARE IF:  You have new symptoms, such as vomiting, severe headache, stiff or painful neck, chest pain, or shortness of breath.  You have severe throat pain, drooling, or changes in your voice.  You have swelling of the neck, or the skin on the neck becomes red and tender.  You have signs of dehydration, such as fatigue, dry mouth, and decreased urination.  You become increasingly sleepy, or you cannot wake up completely.  Your joints become red or painful.   This information is not intended to replace advice given to you by your health care provider. Make sure you discuss any questions you have with your health care provider.   Document Released: 12/12/2000 Document Revised: 09/05/2015 Document Reviewed: 04/09/2015 Elsevier Interactive Patient Education [employer]2016 Elsevier Inc.

## 2015-12-12 ENCOUNTER — Ambulatory Visit (INDEPENDENT_AMBULATORY_CARE_PROVIDER_SITE_OTHER): Payer: 59 | Admitting: Gastroenterology

## 2015-12-12 ENCOUNTER — Ambulatory Visit: Payer: 59 | Admitting: Gastroenterology

## 2015-12-12 ENCOUNTER — Encounter: Payer: Self-pay | Admitting: Gastroenterology

## 2015-12-12 VITALS — BP 110/80 | HR 68 | Ht 63.0 in | Wt 215.6 lb

## 2015-12-12 DIAGNOSIS — K219 Gastro-esophageal reflux disease without esophagitis: Secondary | ICD-10-CM

## 2015-12-12 DIAGNOSIS — K5900 Constipation, unspecified: Secondary | ICD-10-CM

## 2015-12-12 MED ORDER — OMEPRAZOLE 20 MG PO CPDR: 20.0000 mg | DELAYED_RELEASE_CAPSULE | Freq: Every day | ORAL | Status: DC

## 2015-12-12 MED ORDER — LINZESS 290 MCG PO CAPS: 290.0000 ug | ORAL_CAPSULE | Freq: Every day | ORAL | Status: DC

## 2015-12-12 NOTE — Patient Instructions (Signed)
We have sent the following medications to your pharmacy for you to pick up at your convenience:Linzess and omeprazole.   Patient advised to avoid spicy, acidic, citrus, chocolate, mints, fruit and fruit juices.  Limit the intake of caffeine, alcohol and Soda.  Don't exercise too soon after eating.  Don't lie down within 3-4 hours of eating.  Elevate the head of your bed.  Your appt with Dr. Russella DarStark is on 01/17/15 at 2:45pm.  Thank you for choosing me and [facility].  Venita LickMalcolm T. Pleas KochStark, Jr., MD., Clementeen GrahamFACG

## 2015-12-12 NOTE — Progress Notes (Signed)
    History of Present Illness: This is a 34 year old female with chronic constipation that is well controlled on daily Linzess. She relates a new problem with intermittent heartburn and frequent hiccuping over the past several months. Denies dysphagia, chest pain, nausea, vomiting, weight loss, hematochezia, melena and hematemesis.  Current Medications, Allergies, Past Medical History, Past Surgical History, Family History and Social History were reviewed in [facility] electronic medical record.  Physical Exam: General: Well developed, well nourished, no acute distress Head: Normocephalic and atraumatic Eyes:  sclerae anicteric, EOMI Ears: Normal auditory acuity Mouth: No deformity or lesions Lungs: Clear throughout to auscultation Heart: Regular rate and rhythm; no murmurs, rubs or bruits Abdomen: Soft, non tender and non distended. No masses, hepatosplenomegaly or hernias noted. Normal Bowel sounds Musculoskeletal: Symmetrical with no gross deformities  Pulses:  Normal pulses noted Extremities: No clubbing, cyanosis, edema or deformities noted Neurological: Alert oriented x 4, grossly nonfocal Psychological:  Alert and cooperative. Normal mood and affect  Assessment and Recommendations:  1. Constipation. Continue Linzess 290 g daily.  2. GERD. Begin omeprazole 20 mg every morning and standard antireflux measures. Return office visit in about 6 weeks.

## 2015-12-19 ENCOUNTER — Telehealth: Payer: Self-pay | Admitting: Gastroenterology

## 2015-12-19 NOTE — Telephone Encounter (Signed)
Pt states she thinks the Prilosec she started taking has caused her to have a vaginal yeast infection. States the symptoms just started yesterday. Pt also states she has noticed a cramp off and on in the left side of her abdomen and wonders if this is from the prilosec. Please advise.

## 2015-12-19 NOTE — Telephone Encounter (Signed)
Prilosec did not cause yeast infection-she should see her PCP or GYN for this I don't think the cramping is from Prilosec however would hold Prilosec for 2 days to see if abd cramping goes away and can resume if it doesn't. If it goes away call back and we can use another acid reducing medication.

## 2015-12-19 NOTE — Telephone Encounter (Signed)
Spoke with pt and she is aware.

## 2016-01-03 ENCOUNTER — Telehealth: Payer: Self-pay | Admitting: Gastroenterology

## 2016-01-03 NOTE — Telephone Encounter (Signed)
Patient reports that she stopped taking omeprazole and her symptoms of abdominal cramping have all improved.  She is using an occasional OTC product for her heartburn.  She is doing great on her linzess.  She will schedule follow up as needed if her dietary changes and OTC products do not control her symptoms and she needs to discuss an alternative PPI

## 2016-01-10 DIAGNOSIS — H6091 Unspecified otitis externa, right ear: Secondary | ICD-10-CM | POA: Diagnosis not present

## 2016-01-10 MED FILL — OFLOXACIN 0.3% EAR DROPS: 0.3 | 7 days supply | Qty: 5 | Fill #0

## 2016-01-15 MED FILL — LEVOTHYROXINE 112 MCG TAB: 112 | 30 days supply | Qty: 30 | Fill #3

## 2016-01-15 MED FILL — LINZESS 290 MCG CAPSULE: 290 | 30 days supply | Qty: 30 | Fill #1

## 2016-01-18 ENCOUNTER — Ambulatory Visit: Payer: 59 | Admitting: Gastroenterology

## 2016-01-24 DIAGNOSIS — K1121 Acute sialoadenitis: Secondary | ICD-10-CM | POA: Diagnosis not present

## 2016-01-24 DIAGNOSIS — H9201 Otalgia, right ear: Secondary | ICD-10-CM | POA: Diagnosis not present

## 2016-01-24 MED FILL — CLINDAMYCIN HCL 300 MG CAPS: 300 | 10 days supply | Qty: 30 | Fill #0

## 2016-02-14 DIAGNOSIS — H60391 Other infective otitis externa, right ear: Secondary | ICD-10-CM | POA: Diagnosis not present

## 2016-02-14 DIAGNOSIS — H9201 Otalgia, right ear: Secondary | ICD-10-CM | POA: Diagnosis not present

## 2016-02-14 DIAGNOSIS — H73001 Acute myringitis, right ear: Secondary | ICD-10-CM | POA: Diagnosis not present

## 2016-02-14 MED FILL — CIPRODEX OTIC SUSPENSION: 0.3-0.1 | 30 days supply | Qty: 8 | Fill #0

## 2016-02-15 MED FILL — LINZESS 290 MCG CAPSULE: 290 | 30 days supply | Qty: 30 | Fill #2

## 2016-02-15 MED FILL — LEVOTHYROXINE 112 MCG TAB: 112 | 30 days supply | Qty: 30 | Fill #4

## 2016-03-17 MED FILL — LEVOTHYROXINE 112 MCG TAB: 112 | 30 days supply | Qty: 30 | Fill #5

## 2016-03-17 MED FILL — LINZESS 290 MCG CAPSULE: 290 | 30 days supply | Qty: 30 | Fill #3

## 2016-04-16 MED FILL — LINZESS 290 MCG CAPSULE: 290 | 30 days supply | Qty: 30 | Fill #4

## 2016-04-16 MED FILL — LEVOTHYROXINE 112 MCG TAB: 112 | 30 days supply | Qty: 30 | Fill #6

## 2016-05-19 MED FILL — LINZESS 290 MCG CAPSULE: 290 | 30 days supply | Qty: 30 | Fill #5

## 2016-05-19 MED FILL — LEVOTHYROXINE 112 MCG TAB: 112 | 30 days supply | Qty: 30 | Fill #7

## 2016-06-17 MED FILL — LINZESS 290 MCG CAPSULE: 290 | 30 days supply | Qty: 30 | Fill #6

## 2016-06-18 MED FILL — LEVOTHYROXINE 112 MCG TAB: 112 | 30 days supply | Qty: 30 | Fill #8

## 2016-07-17 MED FILL — LINZESS 290 MCG CAPSULE: 290 | 30 days supply | Qty: 30 | Fill #7

## 2016-07-17 MED FILL — LEVOTHYROXINE 112 MCG TAB: 112 | 30 days supply | Qty: 30 | Fill #0

## 2016-08-08 DIAGNOSIS — Z01419 Encounter for gynecological examination (general) (routine) without abnormal findings: Secondary | ICD-10-CM | POA: Diagnosis not present

## 2016-08-08 DIAGNOSIS — Z6835 Body mass index (BMI) 35.0-35.9, adult: Secondary | ICD-10-CM | POA: Diagnosis not present

## 2016-08-08 DIAGNOSIS — Z1151 Encounter for screening for human papillomavirus (HPV): Secondary | ICD-10-CM | POA: Diagnosis not present

## 2016-08-08 DIAGNOSIS — R875 Abnormal microbiological findings in specimens from female genital organs: Secondary | ICD-10-CM | POA: Diagnosis not present

## 2016-08-18 MED FILL — LEVOTHYROXINE 112 MCG TAB: 112 | 30 days supply | Qty: 30 | Fill #1

## 2016-08-19 MED FILL — LINZESS 290 MCG CAPSULE: 290 | 30 days supply | Qty: 30 | Fill #8

## 2016-09-03 DIAGNOSIS — Z23 Encounter for immunization: Secondary | ICD-10-CM | POA: Diagnosis not present

## 2016-09-03 DIAGNOSIS — E039 Hypothyroidism, unspecified: Secondary | ICD-10-CM | POA: Diagnosis not present

## 2016-09-18 MED FILL — LEVOTHYROXINE 112 MCG TAB: 112 | 90 days supply | Qty: 90 | Fill #0

## 2016-09-19 MED FILL — LINZESS 290 MCG CAPSULE: 290 | 30 days supply | Qty: 30 | Fill #9

## 2016-10-23 MED FILL — LINZESS 290 MCG CAPSULE: 290 | 30 days supply | Qty: 30 | Fill #10

## 2016-11-01 DIAGNOSIS — H5213 Myopia, bilateral: Secondary | ICD-10-CM | POA: Diagnosis not present

## 2016-11-01 DIAGNOSIS — H52223 Regular astigmatism, bilateral: Secondary | ICD-10-CM | POA: Diagnosis not present

## 2016-11-21 MED FILL — LINZESS 290 MCG CAPSULE: 290 | 30 days supply | Qty: 30 | Fill #11

## 2016-12-23 ENCOUNTER — Other Ambulatory Visit: Payer: Self-pay | Admitting: Gastroenterology

## 2016-12-23 MED FILL — LINZESS 290 MCG CAPSULE: 290 | 30 days supply | Qty: 30 | Fill #0

## 2016-12-23 MED FILL — LEVOTHYROXINE 112 MCG TAB: 112 | 90 days supply | Qty: 90 | Fill #1

## 2017-01-23 MED FILL — LINZESS 290 MCG CAPSULE: 290 | 30 days supply | Qty: 30 | Fill #1

## 2017-02-13 DIAGNOSIS — E890 Postprocedural hypothyroidism: Secondary | ICD-10-CM | POA: Diagnosis not present

## 2017-02-16 MED FILL — LEVOTHYROXINE 125 MCG TABLE: 125 | 90 days supply | Qty: 90 | Fill #0

## 2017-02-23 MED FILL — LINZESS 290 MCG CAPSULE: 290 | 30 days supply | Qty: 30 | Fill #2

## 2017-03-24 MED FILL — LINZESS 290 MCG CAPSULE: 290 | 90 days supply | Qty: 90 | Fill #3

## 2017-03-30 DIAGNOSIS — E039 Hypothyroidism, unspecified: Secondary | ICD-10-CM | POA: Diagnosis not present

## 2017-06-09 DIAGNOSIS — Z23 Encounter for immunization: Secondary | ICD-10-CM | POA: Diagnosis not present

## 2017-06-09 DIAGNOSIS — M25562 Pain in left knee: Secondary | ICD-10-CM | POA: Diagnosis not present

## 2017-06-25 MED FILL — LINZESS 290 MCG CAPSULE: 290 | 90 days supply | Qty: 90 | Fill #4

## 2017-06-25 MED FILL — LEVOTHYROXINE 125 MCG TABLE: 125 | 90 days supply | Qty: 90 | Fill #0

## 2017-06-29 DIAGNOSIS — M222X2 Patellofemoral disorders, left knee: Secondary | ICD-10-CM | POA: Diagnosis not present

## 2017-09-17 MED FILL — LINZESS 290 MCG CAPSULE: 290 | 90 days supply | Qty: 90 | Fill #5

## 2017-09-17 MED FILL — LEVOTHYROXINE 125 MCG TABLE: 125 | 90 days supply | Qty: 90 | Fill #1

## 2017-12-21 ENCOUNTER — Other Ambulatory Visit: Payer: Self-pay | Admitting: Gastroenterology

## 2017-12-21 ENCOUNTER — Telehealth: Payer: Self-pay | Admitting: Gastroenterology

## 2017-12-21 MED ORDER — LINZESS 290 MCG PO CAPS: 290.0000 ug | ORAL_CAPSULE | Freq: Every day | ORAL | 0 refills | Status: DC

## 2017-12-21 MED FILL — LEVOTHYROXINE 125 MCG TABLE: 125 | 90 days supply | Qty: 90 | Fill #0

## 2017-12-21 NOTE — Telephone Encounter (Signed)
Patient states she needs a refill of medication [facility]. Patient is scheduled to see Dr.Stark 2.8.19

## 2017-12-21 NOTE — Telephone Encounter (Signed)
Prescription sent to patient's pharmacy. Informed patient to keep appt for further refills.

## 2017-12-23 ENCOUNTER — Other Ambulatory Visit: Payer: Self-pay | Admitting: Gastroenterology

## 2017-12-23 ENCOUNTER — Other Ambulatory Visit: Payer: Self-pay

## 2017-12-23 MED ORDER — LINZESS 290 MCG PO CAPS: 290.0000 ug | ORAL_CAPSULE | Freq: Every day | ORAL | 0 refills | Status: DC

## 2017-12-23 MED FILL — LINZESS 290 MCG CAPSULE: 290 | 90 days supply | Qty: 90 | Fill #0

## 2017-12-28 ENCOUNTER — Telehealth: Payer: 59 | Admitting: Family

## 2017-12-28 DIAGNOSIS — R079 Chest pain, unspecified: Secondary | ICD-10-CM

## 2017-12-28 DIAGNOSIS — R0602 Shortness of breath: Secondary | ICD-10-CM

## 2017-12-28 DIAGNOSIS — J019 Acute sinusitis, unspecified: Secondary | ICD-10-CM | POA: Diagnosis not present

## 2017-12-28 DIAGNOSIS — R05 Cough: Secondary | ICD-10-CM

## 2017-12-28 DIAGNOSIS — R059 Cough, unspecified: Secondary | ICD-10-CM

## 2017-12-28 MED FILL — NAPROXEN 500 MG TABLET: 500 | 15 days supply | Qty: 30 | Fill #0

## 2017-12-28 NOTE — Progress Notes (Signed)
Based on what you shared with me it looks like you have a serious condition that should be evaluated in a face to face office visit.  NOTE: Even if you have entered your credit card information for this eVisit, you will not be charged.   If you are having a true medical emergency please call 911.  If you need an urgent face to face visit, [facility] has four urgent care centers for your convenience.  If you need care fast and have a high deductible or no insurance consider:   [email or web address]  [phone]  [address], [address] [place], [place] [address] 8 am to 8 pm Monday-Friday 10 am to 4 pm Saturday-Sunday   The following sites will take your  insurance:    . [facility]  [phone] Get Driving Directions Find a Provider at this Location  [address] [place], [place] [address] . 10 am to 8 pm Monday-Friday . 12 pm to 8 pm Saturday-Sunday   . [facility]  [phone] Get Driving Directions Find a Provider at this Location  [address], [address] [place], [place] [address] . 8 am to 8 pm Monday-Friday . 9 am to 6 pm Saturday . 11 am to 6 pm Sunday   . [facility] [place]  [phone] Get Driving Directions  [address] [place], [place] [address] . 8 am to 8 pm Monday-Friday . 8 am to 4 pm Saturday-Sunday   Your e-visit answers were reviewed by a board certified advanced clinical practitioner to complete your personal care plan.  Thank you for using e-Visits.  

## 2018-01-13 DIAGNOSIS — Z6837 Body mass index (BMI) 37.0-37.9, adult: Secondary | ICD-10-CM | POA: Diagnosis not present

## 2018-01-13 DIAGNOSIS — Z01419 Encounter for gynecological examination (general) (routine) without abnormal findings: Secondary | ICD-10-CM | POA: Diagnosis not present

## 2018-01-13 DIAGNOSIS — E890 Postprocedural hypothyroidism: Secondary | ICD-10-CM | POA: Diagnosis not present

## 2018-01-23 DIAGNOSIS — H5213 Myopia, bilateral: Secondary | ICD-10-CM | POA: Diagnosis not present

## 2018-01-23 DIAGNOSIS — H52221 Regular astigmatism, right eye: Secondary | ICD-10-CM | POA: Diagnosis not present

## 2018-02-05 ENCOUNTER — Ambulatory Visit: Payer: 59 | Admitting: Gastroenterology

## 2018-02-05 ENCOUNTER — Encounter: Payer: Self-pay | Admitting: Gastroenterology

## 2018-02-05 VITALS — BP 100/60 | HR 62 | Ht 64.0 in | Wt 219.0 lb

## 2018-02-05 DIAGNOSIS — K5904 Chronic idiopathic constipation: Secondary | ICD-10-CM

## 2018-02-05 MED ORDER — LINZESS 290 MCG PO CAPS: 290.0000 ug | ORAL_CAPSULE | Freq: Every day | ORAL | 3 refills | Status: DC

## 2018-02-05 NOTE — Progress Notes (Signed)
    History of Present Illness: This is a 37 year old female with chronic idiopathic constipation maintained on Linzess.  She generally has good results with Linzess however she needs to take an OTC laxative on occasion.  Colonoscopy in July 2014 was normal except for external hemorrhoids.  Current Medications, Allergies, Past Medical History, Past Surgical History, Family History and Social History were reviewed in [facility] electronic medical record.  Physical Exam: General: Well developed, well nourished, no acute distress Head: Normocephalic and atraumatic Eyes:  sclerae anicteric, EOMI Ears: Normal auditory acuity Mouth: No deformity or lesions Lungs: Clear throughout to auscultation Heart: Regular rate and rhythm; no murmurs, rubs or bruits Abdomen: Soft, non tender and non distended. No masses, hepatosplenomegaly or hernias noted. Normal Bowel sounds Rectal: not done Musculoskeletal: Symmetrical with no gross deformities  Pulses:  Normal pulses noted Extremities: No clubbing, cyanosis, edema or deformities noted Neurological: Alert oriented x 4, grossly nonfocal Psychological:  Alert and cooperative. Normal mood and affect  Assessment and Recommendations:  1. Chronic idiopathic constipation.  Continue Linzess 290 mcg daily.  OTC laxative as needed. REV in 1 year.

## 2018-02-05 NOTE — Patient Instructions (Signed)
We have sent the following medications to your pharmacy for you to pick up at your convenience: Linzess.  Thank you for choosing me and [facility].  Malcolm T. Stark, Jr., MD., FACG   

## 2018-03-25 MED FILL — LEVOTHYROXINE 125 MCG TABLE: 125 | 90 days supply | Qty: 90 | Fill #0

## 2018-03-25 MED FILL — LINZESS 290 MCG CAPSULE: 290 | 90 days supply | Qty: 90 | Fill #0

## 2018-04-06 MED FILL — HYDROCODON-APAP 7.5-325: 7.5-325 | 2 days supply | Qty: 16 | Fill #0

## 2018-04-06 MED FILL — CLINDAMYCIN HCL 150 MG CAPS: 150 | 9 days supply | Qty: 40 | Fill #0

## 2018-04-30 DIAGNOSIS — R05 Cough: Secondary | ICD-10-CM | POA: Diagnosis not present

## 2018-04-30 DIAGNOSIS — R0989 Other specified symptoms and signs involving the circulatory and respiratory systems: Secondary | ICD-10-CM | POA: Diagnosis not present

## 2018-04-30 DIAGNOSIS — J069 Acute upper respiratory infection, unspecified: Secondary | ICD-10-CM | POA: Diagnosis not present

## 2018-04-30 MED FILL — HYDROCODONE-HOMATROPINE SOL: 5-1.5 | 5 days supply | Qty: 100 | Fill #0

## 2018-06-23 MED FILL — LEVOTHYROXINE 125 MCG TABLE: 125 | 90 days supply | Qty: 90 | Fill #1

## 2018-06-23 MED FILL — LINZESS 290 MCG CAPSULE: 290 | 90 days supply | Qty: 90 | Fill #1

## 2018-07-08 ENCOUNTER — Telehealth: Payer: Self-pay

## 2018-07-08 MED ORDER — PLECANATIDE 3 MG PO TABS: 3.0000 mg | ORAL_TABLET | Freq: Every day | ORAL | 2 refills | Status: DC

## 2018-07-08 MED FILL — TRULANCE 3 MG TABLET: 3 | 30 days supply | Qty: 30 | Fill #0

## 2018-07-08 NOTE — Telephone Encounter (Signed)
Linzess has stopped working for her.  Can we try Trulance or other rx?

## 2018-07-08 NOTE — Telephone Encounter (Signed)
rx sent  Left message for patient to call back Will need coupon

## 2018-07-08 NOTE — Telephone Encounter (Signed)
Patient notified and sample provided

## 2018-07-08 NOTE — Telephone Encounter (Signed)
Tulance 3 mg po qd, 3 month supply

## 2018-07-19 DIAGNOSIS — E890 Postprocedural hypothyroidism: Secondary | ICD-10-CM | POA: Diagnosis not present

## 2018-07-19 DIAGNOSIS — E559 Vitamin D deficiency, unspecified: Secondary | ICD-10-CM | POA: Diagnosis not present

## 2018-07-19 DIAGNOSIS — R7303 Prediabetes: Secondary | ICD-10-CM | POA: Diagnosis not present

## 2018-07-19 DIAGNOSIS — K5900 Constipation, unspecified: Secondary | ICD-10-CM | POA: Diagnosis not present

## 2018-07-20 MED FILL — VIT D2 1.25 MG (50,000 UNIT: 1.25 MG | 28 days supply | Qty: 8 | Fill #0

## 2018-08-03 MED FILL — CONTRAVE ER 8-90 MG TABLET: 8-90 | 30 days supply | Qty: 120 | Fill #0

## 2018-08-13 MED FILL — VIT D2 1.25 MG (50,000 UNIT: 1.25 MG | 28 days supply | Qty: 8 | Fill #1

## 2018-09-10 MED FILL — VIT D2 1.25 MG (50,000 UNIT: 1.25 MG | 28 days supply | Qty: 8 | Fill #2

## 2018-09-22 MED FILL — LINZESS 290 MCG CAPSULE: 290 | 90 days supply | Qty: 90 | Fill #2

## 2018-09-23 MED FILL — LEVOTHYROXINE 125 MCG TABLE: 125 | 90 days supply | Qty: 90 | Fill #0

## 2018-09-23 MED FILL — CONTRAVE ER 8-90 MG TABLET: 8-90 | 30 days supply | Qty: 120 | Fill #0

## 2018-10-12 ENCOUNTER — Encounter (INDEPENDENT_AMBULATORY_CARE_PROVIDER_SITE_OTHER): Payer: Self-pay

## 2018-10-25 DIAGNOSIS — Z6834 Body mass index (BMI) 34.0-34.9, adult: Secondary | ICD-10-CM | POA: Diagnosis not present

## 2018-10-25 DIAGNOSIS — E559 Vitamin D deficiency, unspecified: Secondary | ICD-10-CM | POA: Diagnosis not present

## 2018-10-25 DIAGNOSIS — E6609 Other obesity due to excess calories: Secondary | ICD-10-CM | POA: Diagnosis not present

## 2018-10-26 MED FILL — VIT D2 1.25 MG (50,000 UNIT: 1.25 MG | 84 days supply | Qty: 12 | Fill #0

## 2018-10-28 ENCOUNTER — Encounter (INDEPENDENT_AMBULATORY_CARE_PROVIDER_SITE_OTHER): Payer: Self-pay | Admitting: Bariatrics

## 2018-10-28 ENCOUNTER — Ambulatory Visit (INDEPENDENT_AMBULATORY_CARE_PROVIDER_SITE_OTHER): Payer: 59 | Admitting: Bariatrics

## 2018-10-28 VITALS — BP 116/83 | HR 85 | Temp 98.3°F | Ht 65.0 in | Wt 208.0 lb

## 2018-10-28 DIAGNOSIS — E039 Hypothyroidism, unspecified: Secondary | ICD-10-CM

## 2018-10-28 DIAGNOSIS — E669 Obesity, unspecified: Secondary | ICD-10-CM | POA: Diagnosis not present

## 2018-10-28 DIAGNOSIS — E7849 Other hyperlipidemia: Secondary | ICD-10-CM | POA: Diagnosis not present

## 2018-10-28 DIAGNOSIS — R5383 Other fatigue: Secondary | ICD-10-CM | POA: Diagnosis not present

## 2018-10-28 DIAGNOSIS — R0602 Shortness of breath: Secondary | ICD-10-CM | POA: Diagnosis not present

## 2018-10-28 DIAGNOSIS — E559 Vitamin D deficiency, unspecified: Secondary | ICD-10-CM | POA: Diagnosis not present

## 2018-10-28 DIAGNOSIS — Z6834 Body mass index (BMI) 34.0-34.9, adult: Secondary | ICD-10-CM

## 2018-10-28 DIAGNOSIS — Z1331 Encounter for screening for depression: Secondary | ICD-10-CM | POA: Diagnosis not present

## 2018-10-28 DIAGNOSIS — Z9189 Other specified personal risk factors, not elsewhere classified: Secondary | ICD-10-CM

## 2018-10-28 DIAGNOSIS — Z0289 Encounter for other administrative examinations: Secondary | ICD-10-CM

## 2018-10-29 LAB — INSULIN, RANDOM: INSULIN: 9.4 u[IU]/mL (ref 2.6–24.9)

## 2018-11-01 DIAGNOSIS — Z6834 Body mass index (BMI) 34.0-34.9, adult: Secondary | ICD-10-CM

## 2018-11-01 DIAGNOSIS — E669 Obesity, unspecified: Secondary | ICD-10-CM | POA: Insufficient documentation

## 2018-11-01 DIAGNOSIS — E038 Other specified hypothyroidism: Secondary | ICD-10-CM | POA: Insufficient documentation

## 2018-11-01 DIAGNOSIS — E7849 Other hyperlipidemia: Secondary | ICD-10-CM | POA: Insufficient documentation

## 2018-11-01 NOTE — Progress Notes (Signed)
Office: [phone]  /  Fax: [phone]   Dear Dr. Wynelle Link,   Thank you for referring Susan Shields to our clinic. The following note includes my evaluation and treatment recommendations.  HPI:   Chief Complaint: OBESITY    Susan Shields has been referred by Deatra James, MD for consultation regarding her obesity and obesity related comorbidities.    Susan Shields (MR# [id number]) is a 37 y.o. female who presents on 11/01/2018 for obesity evaluation and treatment. Current BMI is Body mass index is 34.61 kg/m.Susan Shields has been struggling with her weight for many years and has been unsuccessful in either losing weight, maintaining weight loss, or reaching her healthy weight goal. Susan Shields is currently taking Contrave  Two  tablets twice daily.     Susan Shields attended our information session and states she is currently in the action stage of change and ready to dedicate time achieving and maintaining a healthier weight. Susan Shields is interested in becoming our Susan Shields and working on intensive lifestyle modifications including (but not limited to) diet, exercise and weight loss.    Susan Shields states her family eats meals together she thinks her family will eat healthier with  her her desired weight loss is 52.2 lbs she started gaining weight in 2013 her heaviest weight ever was 226 lbs. she is a picky eater and doesn't like to eat healthier foods  she has significant food cravings for sweets (around menstrual cycle) she snacks on cheese, yogurt, fruit and chips she skips dinner occasionally she is frequently drinking liquids with calories she frequently eats larger portions than normal    Susan Shields feels her energy is lower than it should be in general. This has worsened with weight gain and has not worsened recently. Susan Shields admits to daytime somnolence and admits to waking up still tired. Susan Shields is at risk for obstructive sleep apnea. Patent has a history of symptoms of daytime Susan, morning  Susan and morning headache. Susan Shields generally gets 7 or 8 hours of sleep per night, and states they generally sleep "okay". Snoring is present. Apneic episodes are not present. Epworth Sleepiness Score is 6  Dyspnea on exertion Jirah notes increasing shortness of breath with exercising and seems to be worsening over time with weight gain. She notes getting out of breath sooner with activity than she used to. This has not gotten worse recently. Susan Shields denies orthopnea.  Vitamin D deficiency Susan Shields has a diagnosis of vitamin D deficiency. Her last vitamin D level was at 36.2 on 10/26/18. She is currently taking high dose vit D per her PCP and she denies nausea, vomiting or muscle weakness.  At risk for osteopenia and osteoporosis Deshundra is at higher risk of osteopenia and osteoporosis due to vitamin D deficiency.   Hypothyroidism with history of Graves Disease Carlie has a diagnosis of hypothyroidism. She is currently taking levothyroxine. She denies any sensitivity to hot or cold.   Hyperlipidemia Susan Shields has hyperlipidemia and she is attempting to improve her cholesterol levels with intensive lifestyle modification including a low saturated fat diet, exercise and weight loss. Her last cholesterol was at 261 and LDL was at 170 on 07/19/18. Susan Shields had an adverse reaction when she tried Simvastatin. She denies any chest pain, claudication or myalgias.  Depression Screen Susan Shields's Food and Mood (modified PHQ-9) score was  Depression screen PHQ 2/9 10/28/2018  Decreased Interest 1  Down, Depressed, Hopeless 0  PHQ - 2 Score 1  Altered sleeping 0  Tired, decreased energy 1  Change in appetite 1  Feeling bad or failure about yourself  0  Trouble concentrating 1  Moving slowly or fidgety/restless 0  Suicidal thoughts 0  PHQ-9 Score 4  Difficult doing work/chores Not difficult at all    ALLERGIES: No Known Allergies  MEDICATIONS: Current Outpatient Medications on File Prior to Visit    Medication Sig Dispense Refill  . cholecalciferol (VITAMIN D) 1000 units tablet Take 1,000 Units by mouth daily.    [name] Kitchen levothyroxine (SYNTHROID, LEVOTHROID) 125 MCG tablet Take 125 mcg by mouth daily before breakfast.    . LINZESS 290 MCG CAPS capsule Take 1 capsule (290 mcg total) by mouth daily. 90 capsule 3  . Naltrexone-buPROPion HCl ER (CONTRAVE) 8-90 MG TB12 Take by mouth.     No current facility-administered medications on file prior to visit.     PAST MEDICAL HISTORY: Past Medical History:  Diagnosis Date  . Constipation   . Graves disease   . Hyperlipidemia   . IBS (irritable bowel syndrome)   . Thyroid disease   . Vitamin D deficiency     PAST SURGICAL HISTORY: Past Surgical History:  Procedure Laterality Date  . CESAREAN SECTION    . COLONOSCOPY      SOCIAL HISTORY: Social History   Tobacco Use  . Smoking status: Never Smoker  . Smokeless tobacco: Never Used  Substance Use Topics  . Alcohol use: Yes    Comment: occasional  . Drug use: No    FAMILY HISTORY: Family History  Problem Relation Age of Onset  . Diabetes Mother   . Cancer Mother        MMT Cancer  . Obesity Mother   . Throat cancer Father   . Sudden death Father   . Stroke Father     ROS: Review of Systems  Constitutional: Positive for malaise/Susan.  Eyes:       + Wear Glasses or Contacts  Respiratory: Positive for shortness of breath (on exertion).   Cardiovascular: Negative for chest pain, orthopnea and claudication.  Gastrointestinal: Positive for constipation. Negative for nausea and vomiting.  Musculoskeletal: Negative for myalgias.       Negative for muscle weakness  Skin:       + Hair Changes (falling out)  Neurological: Positive for dizziness and headaches.    PHYSICAL EXAM: Blood pressure 116/83, pulse 85, temperature 98.3 F (36.8 C), temperature source Oral, height 5\' 5"  (1.651 m), weight 208 lb (94.3 kg), last menstrual period 10/04/2018, SpO2 98 %. Body mass  index is 34.61 kg/m. Physical Exam  Constitutional: She is oriented to person, place, and time. She appears well-developed and well-nourished.  HENT:  Head: Normocephalic and atraumatic.  Nose: Nose normal.  M[place]pati = 2  Eyes: EOM are normal. No scleral icterus.  Neck: Normal range of motion. Neck supple. No thyromegaly present.  Cardiovascular: Normal rate and regular rhythm.  Pulmonary/Chest: No respiratory distress.  Abdominal: Soft. There is no tenderness.  + Obesity  Musculoskeletal: Normal range of motion.  Range of Motion normal in all 4 extremities  Neurological: She is alert and oriented to person, place, and time. Coordination normal.  Skin: Skin is warm and dry.  Psychiatric: She has a normal mood and affect.  Vitals reviewed.   RECENT LABS AND TESTS: BMET No results found for: NA, K, CL, CO2, GLUCOSE, BUN, CREATININE, CALCIUM, GFRNONAA, GFRAA No results found for: HGBA1C Lab Results  Component Value Date   INSULIN 9.4 10/28/2018   CBC No results found for:  WBC, RBC, HGB, HCT, PLT, MCV, MCH, MCHC, RDW, LYMPHSABS, MONOABS, EOSABS, BASOSABS Iron/TIBC/Ferritin/ %Sat No results found for: IRON, TIBC, FERRITIN, IRONPCTSAT Lipid Panel  No results found for: CHOL, TRIG, HDL, CHOLHDL, VLDL, LDLCALC, LDLDIRECT Hepatic Function Panel  No results found for: PROT, ALBUMIN, AST, ALT, ALKPHOS, BILITOT, BILIDIR, IBILI No results found for: TSH  ECG  shows NSR with a rate of 78 BPM INDIRECT CALORIMETER done today shows a VO2 of 268 and a REE of 1863.  Her calculated basal metabolic rate is 6962 thus her basal metabolic rate is better than expected.    ASSESSMENT AND PLAN: Other Susan - Plan: EKG 12-Lead  Shortness of breath on exertion  Vitamin D deficiency  Other specified hypothyroidism  Other hyperlipidemia - Plan: Insulin, random  Depression screening  At risk for osteoporosis  Class 1 obesity with serious comorbidity and body mass index (BMI) of 34.0  to 34.9 in adult, unspecified obesity type  PLAN: Susan Renuka was informed that her Susan may be related to obesity, depression or many other causes. Labs will be ordered, and in the meanwhile Telma has agreed to work on diet, exercise and weight loss to help with Susan. Proper sleep hygiene was discussed including the need for 7-8 hours of quality sleep each night. A sleep study was not ordered based on symptoms and Epworth score.  Dyspnea on exertion Susan Shields's shortness of breath appears to be obesity related and exercise induced. She has agreed to work on weight loss and will continue her current exercise regimen to treat her exercise induced shortness of breath. If Lezlee follows our instructions and loses weight without improvement of her shortness of breath, we will plan to refer to pulmonology. We will monitor this condition regularly. Jannie agrees to this plan.  Vitamin D Deficiency Susan Shields was informed that low vitamin D levels contributes to Susan and are associated with obesity, breast, and colon cancer. She agrees to continue to take high dose vitamin D and will follow up for routine testing of vitamin D, at least 2-3 times per year. She was informed of the risk of over-replacement of vitamin D and agrees to not increase her dose unless she discusses this with [place] first.  At risk for osteopenia and osteoporosis Susan Shields was given extended  (15 minutes) osteoporosis prevention counseling today. Ceceilia is at risk for osteopenia and osteoporosis due to her vitamin D deficiency. She was encouraged to take her vitamin D and follow her higher calcium diet and increase strengthening exercise to help strengthen her bones and decrease her risk of osteopenia and osteoporosis.  Hypothyroidism with history of Graves Disease Tiena was informed of the importance of good thyroid control to help with weight loss efforts. She was also informed that supertheraputic thyroid levels are dangerous and  will not improve weight loss results. Vylette will continue to take levothyroxine and follow up with her PCP.  Hyperlipidemia Betzaira was informed of the American Heart Association Guidelines emphasizing intensive lifestyle modifications as the first line treatment for hyperlipidemia. We discussed many lifestyle modifications today in depth, and Tyeshia will start to work on decreasing saturated fats such as fatty red meat, butter and many fried foods. She will also increase vegetables and lean protein in her diet and start to work on exercise and weight loss efforts. We will follow and if there is no decrease with diet, we may consider statin in the future.  Depression Screen Dawanda had a negative depression screening. Depression is commonly associated with obesity  and often results in emotional eating behaviors. We will monitor this closely and work on CBT to help improve the non-hunger eating patterns.   Obesity Ellis is currently in the action stage of change and her goal is to continue with weight loss efforts. I recommend Najah begin the structured treatment plan as follows:  She has agreed to follow the Modified Category 3 plan Berenice has been instructed to eventually work up to a goal of 150 minutes of combined cardio and strengthening exercise per week for weight loss and overall health benefits. We discussed the following Behavioral Modification Strategies today: no skipping meals,  increase H2O intake, increasing lean protein intake, decreasing simple carbohydrates , increasing vegetables, decrease eating out and work on meal planning and easy cooking plans  Nyree was advised to stop the Contrave at this time.   She was informed of the importance of frequent follow up visits to maximize her success with intensive lifestyle modifications for her multiple health conditions. She was informed we would discuss her lab results at her next visit unless there is a critical issue that needs to be  addressed sooner. Eugina agreed to keep her next visit at the agreed upon time to discuss these results.    OBESITY BEHAVIORAL INTERVENTION VISIT  Today's visit was # 1   Starting weight: 208 lbs Starting date: 10/28/18 Today's weight : 208 lbs Today's date: 10/28/18 Total lbs lost to date: 0   ASK: We discussed the diagnosis of obesity with Loredana R Markman today and Letanya agreed to give [place] permission to discuss obesity behavioral modification therapy today.  ASSESS: Atziri has the diagnosis of obesity and her BMI today is 34.61 Migdalia is in the action stage of change   ADVISE: Nevaeh was educated on the multiple health risks of obesity as well as the benefit of weight loss to improve her health. She was advised of the need for long term treatment and the importance of lifestyle modifications to improve her current health and to decrease her risk of future health problems.  AGREE: Multiple dietary modification options and treatment options were discussed and  Vertie agreed to follow the recommendations documented in the above note.  ARRANGE: Tawnia was educated on the importance of frequent visits to treat obesity as outlined per CMS and USPSTF guidelines and agreed to schedule her next follow up appointment today.  Cristi Loron, am acting as Energy manager for [employer]. Manson Passey, DO  I have reviewed the above documentation for accuracy and completeness, and I agree with the above. -Corinna Capra, DO

## 2018-11-08 ENCOUNTER — Ambulatory Visit (INDEPENDENT_AMBULATORY_CARE_PROVIDER_SITE_OTHER): Payer: 59 | Admitting: Bariatrics

## 2018-11-08 ENCOUNTER — Encounter (INDEPENDENT_AMBULATORY_CARE_PROVIDER_SITE_OTHER): Payer: Self-pay | Admitting: Bariatrics

## 2018-11-08 VITALS — BP 147/77 | HR 71 | Temp 98.2°F | Ht 65.0 in | Wt 210.0 lb

## 2018-11-08 DIAGNOSIS — Z6835 Body mass index (BMI) 35.0-35.9, adult: Secondary | ICD-10-CM | POA: Diagnosis not present

## 2018-11-08 DIAGNOSIS — E559 Vitamin D deficiency, unspecified: Secondary | ICD-10-CM | POA: Diagnosis not present

## 2018-11-08 DIAGNOSIS — K5901 Slow transit constipation: Secondary | ICD-10-CM

## 2018-11-09 NOTE — Progress Notes (Signed)
Office: [phone]  /  Fax: [phone]   HPI:   Chief Complaint: OBESITY Susan Shields is here to discuss her progress with her obesity treatment plan. She is on the Category 3 plan and is following her eating plan approximately 98 % of the time. She states she is exercising 0 minutes 0 times per week. Susan Shields was advised to stop Contrave at the last visit. She does not like the bread on the Category 3 plan. She states that her hunger is controlled. Pragya has had minimal cravings.  Her weight is 210 lb (95.3 kg) today and has had a weight gain of 2 pounds (water weight) over a period of 1 to 2 weeks since her last visit. She has gained 2 lbs since starting treatment with [place].  Slow Transit Constipation Susan Shields notes constipation for the last few weeks, worse since attempting weight loss. She states BM are less frequent and are not hard and painful. She increased vegetables and drinking water. Susan Shields is taking Linzess.  Vitamin D deficiency Susan Shields has a diagnosis of vitamin D deficiency. Susan Shields is currently taking OTC vit D @1 ,000 IU daily and she denies nausea, vomiting or muscle weakness.  ALLERGIES: No Known Allergies  MEDICATIONS: Current Outpatient Medications on File Prior to Visit  Medication Sig Dispense Refill   cholecalciferol (VITAMIN D) 1000 units tablet Take 1,000 Units by mouth daily.     levothyroxine (SYNTHROID, LEVOTHROID) 125 MCG tablet Take 125 mcg by mouth daily before breakfast.     LINZESS 290 MCG CAPS capsule Take 1 capsule (290 mcg total) by mouth daily. 90 capsule 3   No current facility-administered medications on file prior to visit.     PAST MEDICAL HISTORY: Past Medical History:  Diagnosis Date   Constipation    Graves disease    Hyperlipidemia    IBS (irritable bowel syndrome)    Thyroid disease    Vitamin D deficiency     PAST SURGICAL HISTORY: Past Surgical History:  Procedure Laterality Date   CESAREAN SECTION     COLONOSCOPY       SOCIAL HISTORY: Social History   Tobacco Use   Smoking status: Never Smoker   Smokeless tobacco: Never Used  Substance Use Topics   Alcohol use: Yes    Comment: occasional   Drug use: No    FAMILY HISTORY: Family History  Problem Relation Age of Onset   Diabetes Mother    Cancer Mother        MMT Cancer   Obesity Mother    Throat cancer Father    Sudden death Father    Stroke Father     ROS: Review of Systems  Constitutional: Negative for weight loss.  Gastrointestinal: Positive for constipation. Negative for nausea and vomiting.  Musculoskeletal:       Negative for muscle weakness  Endo/Heme/Allergies:       + cravings    PHYSICAL EXAM: Blood pressure (!) 147/77, pulse 71, temperature 98.2 F (36.8 C), temperature source Oral, height 5\' 5"  (1.651 m), weight 210 lb (95.3 kg), last menstrual period 11/05/2018, SpO2 95 %. Body mass index is 34.95 kg/m. Physical Exam  Constitutional: She is oriented to person, place, and time. She appears well-developed and well-nourished.  Cardiovascular: Normal rate.  Pulmonary/Chest: Effort normal.  Musculoskeletal: Normal range of motion.  Neurological: She is oriented to person, place, and time.  Skin: Skin is warm and dry.  Psychiatric: She has a normal mood and affect. Her behavior is normal.  Vitals  reviewed.   RECENT LABS AND TESTS: BMET No results found for: NA, K, CL, CO2, GLUCOSE, BUN, CREATININE, CALCIUM, GFRNONAA, GFRAA No results found for: HGBA1C Lab Results  Component Value Date   INSULIN 9.4 10/28/2018   CBC No results found for: WBC, RBC, HGB, HCT, PLT, MCV, MCH, MCHC, RDW, LYMPHSABS, MONOABS, EOSABS, BASOSABS Iron/TIBC/Ferritin/ %Sat No results found for: IRON, TIBC, FERRITIN, IRONPCTSAT Lipid Panel  No results found for: CHOL, TRIG, HDL, CHOLHDL, VLDL, LDLCALC, LDLDIRECT Hepatic Function Panel  No results found for: PROT, ALBUMIN, AST, ALT, ALKPHOS, BILITOT, BILIDIR, IBILI No results  found for: TSH   ASSESSMENT AND PLAN: Slow transit constipation  Class 2 severe obesity with serious comorbidity and body mass index (BMI) of 35.0 to 35.9 in adult, unspecified obesity type (HCC)  PLAN:  Slow Transit Constipation Susan Shields will continue to take her Linzess and she will drink at least 64 ounces of water daily. Susan Shields will follow up with our clinic in 2 weeks.  Vitamin D Deficiency Susan Shields was informed that low vitamin D levels contributes to fatigue and are associated with obesity, breast, and colon cancer. She will continue to take OTC Vit D @1 ,000 IU daily and will follow up for routine testing of vitamin D, at least 2-3 times per year. She was informed of the risk of over-replacement of vitamin D and agrees to not increase her dose unless she discusses this with Susan Shields first.   I spent > than 50% of the 15 minute visit on counseling as documented in the note.  Obesity Susan Shields is currently in the action stage of change. As such, her goal is to continue with weight loss efforts She has agreed to follow the Category 3 plan Susan Shields has been instructed to work up to a goal of 150 minutes of combined cardio and strengthening exercise per week for weight loss and overall health benefits. We discussed the following Behavioral Modification Strategies today: increase H2O intake, no skipping meals, increasing lean protein intake, decreasing simple carbohydrates , increasing vegetables and work on meal planning and easy cooking plans Tamma stopped taking Contrave. She will portion out her meat and other protein.  Susan Shields has agreed to follow up with our clinic in 2 weeks. She was informed of the importance of frequent follow up visits to maximize her success with intensive lifestyle modifications for her multiple health conditions.   OBESITY BEHAVIORAL INTERVENTION VISIT  Today's visit was # 2   Starting weight: 208 lbs Starting date: 10/28/2018 Today's weight : 210 lbs  Today's  date: 11/08/2018 Total lbs lost to date: 0   ASK: We discussed the diagnosis of obesity with Susan Shields today and Susan Shields agreed to give Susan Shields permission to discuss obesity behavioral modification therapy today.  ASSESS: Susan Shields has the diagnosis of obesity and her BMI today is 34.95 Susan Shields is in the action stage of change   ADVISE: Susan Shields was educated on the multiple health risks of obesity as well as the benefit of weight loss to improve her health. She was advised of the need for long term treatment and the importance of lifestyle modifications to improve her current health and to decrease her risk of future health problems.  AGREE: Multiple dietary modification options and treatment options were discussed and  Susan Shields agreed to follow the recommendations documented in the above note.  ARRANGE: Susan Shields was educated on the importance of frequent visits to treat obesity as outlined per CMS and USPSTF guidelines and agreed to schedule her next follow  up appointment today.  Cristi Loron, am acting as Energy manager for [employer]. Manson Passey, DO  I have reviewed the above documentation for accuracy and completeness, and I agree with the above. -Corinna Capra, DO

## 2018-11-11 ENCOUNTER — Encounter (INDEPENDENT_AMBULATORY_CARE_PROVIDER_SITE_OTHER): Payer: Self-pay | Admitting: Bariatrics

## 2018-11-15 ENCOUNTER — Ambulatory Visit (INDEPENDENT_AMBULATORY_CARE_PROVIDER_SITE_OTHER): Payer: Self-pay | Admitting: Bariatrics

## 2018-11-23 ENCOUNTER — Ambulatory Visit (INDEPENDENT_AMBULATORY_CARE_PROVIDER_SITE_OTHER): Payer: 59 | Admitting: Bariatrics

## 2018-11-23 VITALS — BP 112/76 | HR 73 | Temp 98.5°F | Ht 65.0 in | Wt 207.0 lb

## 2018-11-23 DIAGNOSIS — E559 Vitamin D deficiency, unspecified: Secondary | ICD-10-CM

## 2018-11-23 DIAGNOSIS — Z6834 Body mass index (BMI) 34.0-34.9, adult: Secondary | ICD-10-CM

## 2018-11-23 DIAGNOSIS — E669 Obesity, unspecified: Secondary | ICD-10-CM | POA: Diagnosis not present

## 2018-11-23 DIAGNOSIS — K5901 Slow transit constipation: Secondary | ICD-10-CM

## 2018-11-29 ENCOUNTER — Ambulatory Visit (INDEPENDENT_AMBULATORY_CARE_PROVIDER_SITE_OTHER): Payer: Self-pay | Admitting: Bariatrics

## 2018-11-30 NOTE — Progress Notes (Signed)
Office: [phone]  /  Fax: [phone]   HPI:   Chief Complaint: OBESITY Susan Shields is here to discuss her progress with her obesity treatment plan. She is on the Category 3 plan and is following her eating plan approximately 95 % of the time. She states she is walking and doing step aerobics 20 to 30 minutes 4 times per week. Susan Shields is doing well with the Category 3 plan. She is still doing weight watchers. Her weight is 207 lb (93.9 kg) today and has had a weight loss of 3 pounds over a period of 2 weeks since her last visit. She has lost 1 lb since starting treatment with us.  Slow Transit Constipation Susan Shields  has constipation and she is currently taking Linzess. She states BM are not hard and painful. She denies hematochezia or melena. She is currently taking Linzess.  Vitamin D deficiency Susan Shields has a diagnosis of vitamin D deficiency. Susan Shields is currently taking high dose vit D per her PCP and she denies nausea, vomiting or muscle weakness.  ALLERGIES: No Known Allergies  MEDICATIONS: Current Outpatient Medications on File Prior to Visit  Medication Sig Dispense Refill  . cholecalciferol (VITAMIN D) 1000 units tablet Take 1,000 Units by mouth daily.    [name] Kitchen. levothyroxine (SYNTHROID, LEVOTHROID) 125 MCG tablet Take 125 mcg by mouth daily before breakfast.    . LINZESS 290 MCG CAPS capsule Take 1 capsule (290 mcg total) by mouth daily. 90 capsule 3   No current facility-administered medications on file prior to visit.     PAST MEDICAL HISTORY: Past Medical History:  Diagnosis Date  . Constipation   . Graves disease   . Hyperlipidemia   . IBS (irritable bowel syndrome)   . Thyroid disease   . Vitamin D deficiency     PAST SURGICAL HISTORY: Past Surgical History:  Procedure Laterality Date  . CESAREAN SECTION    . COLONOSCOPY      SOCIAL HISTORY: Social History   Tobacco Use  . Smoking status: Never Smoker  . Smokeless tobacco: Never Used  Substance Use Topics    . Alcohol use: Yes    Comment: occasional  . Drug use: No    FAMILY HISTORY: Family History  Problem Relation Age of Onset  . Diabetes Mother   . Cancer Mother        MMT Cancer  . Obesity Mother   . Throat cancer Father   . Sudden death Father   . Stroke Father     ROS: Review of Systems  Constitutional: Positive for weight loss.  Gastrointestinal: Negative for melena, nausea and vomiting.       Negative for hematochezia  Musculoskeletal:       Negative for muscle weakness    PHYSICAL EXAM: Blood pressure 112/76, pulse 73, temperature 98.5 F (36.9 C), temperature source Oral, height 5\' 5"  (1.651 m), weight 207 lb (93.9 kg), last menstrual period 11/05/2018, SpO2 99 %. Body mass index is 34.45 kg/m. Physical Exam  Constitutional: She is oriented to person, place, and time. She appears well-developed and well-nourished.  Cardiovascular: Normal rate.  Pulmonary/Chest: Effort normal.  Musculoskeletal: Normal range of motion.  Neurological: She is oriented to person, place, and time.  Skin: Skin is warm and dry.  Psychiatric: She has a normal mood and affect. Her behavior is normal.  Vitals reviewed.   RECENT LABS AND TESTS: BMET No results found for: NA, K, CL, CO2, GLUCOSE, BUN, CREATININE, CALCIUM, GFRNONAA, GFRAA No results found for:  HGBA1C Lab Results  Component Value Date   INSULIN 9.4 10/28/2018   CBC No results found for: WBC, RBC, HGB, HCT, PLT, MCV, MCH, MCHC, RDW, LYMPHSABS, MONOABS, EOSABS, BASOSABS Iron/TIBC/Ferritin/ %Sat No results found for: IRON, TIBC, FERRITIN, IRONPCTSAT Lipid Panel  No results found for: CHOL, TRIG, HDL, CHOLHDL, VLDL, LDLCALC, LDLDIRECT Hepatic Function Panel  No results found for: PROT, ALBUMIN, AST, ALT, ALKPHOS, BILITOT, BILIDIR, IBILI No results found for: TSH   ASSESSMENT AND PLAN: Slow transit constipation  Vitamin D deficiency  Class 1 obesity with serious comorbidity and body mass index (BMI) of 34.0 to  34.9 in adult, unspecified obesity type  PLAN:  Slow Transit Constipation She was advised to increase her H20 intake and work on increasing her fiber intake and vegetables. High fiber foods were discussed today.  Vitamin D Deficiency Susan Shields was informed that low vitamin D levels contributes to fatigue and are associated with obesity, breast, and colon cancer. She will continue to take high dose Vit D per her PCP and will follow up for routine testing of vitamin D, at least 2-3 times per year. She was informed of the risk of over-replacement of vitamin D and agrees to not increase her dose unless she discusses this with [place] first.  I spent > than 50% of the 15 minute visit on counseling as documented in the note.  Obesity Susan Shields is currently in the action stage of change. As such, her goal is to continue with weight loss efforts She has agreed to follow the Category 3 plan Susan Shields has been instructed to work up to a goal of 150 minutes of combined cardio and strengthening exercise per week for weight loss and overall health benefits. We discussed the following Behavioral Modification Strategies today: increase H2O intake, increasing lean protein intake, decreasing simple carbohydrates, increasing vegetables, work on meal planning and easy cooking plans, holiday eating strategies and celebration eating strategies  Susan Shields has agreed to follow up with our clinic in 2 weeks. She was informed of the importance of frequent follow up visits to maximize her success with intensive lifestyle modifications for her multiple health conditions.   OBESITY BEHAVIORAL INTERVENTION VISIT  Today's visit was # 3   Starting weight: 208 lbs Starting date: 10/28/2018 Today's weight : 207 lbs Today's date: 11/23/2018 Total lbs lost to date: 1   ASK: We discussed the diagnosis of obesity with Susan Shields today and Susan Shields agreed to give [place] permission to discuss obesity behavioral modification therapy  today.  ASSESS: Susan Shields has the diagnosis of obesity and her BMI today is 34.45 Susan Shields is in the action stage of change   ADVISE: Susan Shields was educated on the multiple health risks of obesity as well as the benefit of weight loss to improve her health. She was advised of the need for long term treatment and the importance of lifestyle modifications to improve her current health and to decrease her risk of future health problems.  AGREE: Multiple dietary modification options and treatment options were discussed and  Susan Shields agreed to follow the recommendations documented in the above note.  ARRANGE: Susan Shields was educated on the importance of frequent visits to treat obesity as outlined per CMS and USPSTF guidelines and agreed to schedule her next follow up appointment today.  Cristi Loron, am acting as Energy manager for [employer]. Manson Passey, DO  I have reviewed the above documentation for accuracy and completeness, and I agree with the above. -Corinna Capra, DO

## 2018-12-07 ENCOUNTER — Ambulatory Visit (INDEPENDENT_AMBULATORY_CARE_PROVIDER_SITE_OTHER): Payer: 59 | Admitting: Bariatrics

## 2018-12-07 ENCOUNTER — Encounter (INDEPENDENT_AMBULATORY_CARE_PROVIDER_SITE_OTHER): Payer: Self-pay | Admitting: Bariatrics

## 2018-12-07 VITALS — BP 130/80 | HR 83 | Temp 98.0°F | Ht 65.0 in | Wt 206.0 lb

## 2018-12-07 DIAGNOSIS — E559 Vitamin D deficiency, unspecified: Secondary | ICD-10-CM | POA: Diagnosis not present

## 2018-12-07 DIAGNOSIS — E038 Other specified hypothyroidism: Secondary | ICD-10-CM | POA: Diagnosis not present

## 2018-12-07 DIAGNOSIS — Z9189 Other specified personal risk factors, not elsewhere classified: Secondary | ICD-10-CM

## 2018-12-07 DIAGNOSIS — E8881 Metabolic syndrome: Secondary | ICD-10-CM

## 2018-12-07 DIAGNOSIS — Z6834 Body mass index (BMI) 34.0-34.9, adult: Secondary | ICD-10-CM

## 2018-12-07 DIAGNOSIS — E669 Obesity, unspecified: Secondary | ICD-10-CM

## 2018-12-07 MED ORDER — METFORMIN HCL 500 MG PO TABS
500.0000 mg | ORAL_TABLET | Freq: Every day | ORAL | 0 refills | Status: DC
Start: 2018-12-07 — End: 2018-12-30

## 2018-12-07 MED FILL — metFORMIN HCL 500 MG TABS: 500 | 30 days supply | Qty: 30 | Fill #0

## 2018-12-07 NOTE — Progress Notes (Signed)
Office: [phone]  /  Fax: [phone]   HPI:   Chief Complaint: OBESITY Susan Shields is here to discuss her progress with her obesity treatment plan. She is on the Category 3 plan and is following her eating plan approximately 90 % of the time. She states she is doing step aerobics 20 to 30 minutes 4 times per week. Susan Shields is doing well with the Category 3 plan. She is having increased hunger. Her weight is 206 lb (93.4 kg) today and has had a weight loss of 1 pound over a period of 2 weeks since her last visit. She has lost 2 lbs since starting treatment with [place].  Vitamin D deficiency Susan Shields has a diagnosis of vitamin D deficiency. She is currently taking OTC vit D 1,000 IU daily and denies nausea, vomiting or muscle weakness.  Hypothyroidism Susan Shields has a diagnosis of hypothyroidism. She is on levothyroxine. She denies hot or cold intolerance.   Insulin Resistance Susan Shields has a diagnosis of insulin resistance based on her elevated fasting insulin level of 9.4. Although Angell's blood glucose readings are still under good control, insulin resistance puts her at greater risk of metabolic syndrome and diabetes. She is not taking metformin currently and continues to work on diet and exercise to decrease risk of diabetes. Susan Shields admits to polyphagia.  At risk for diabetes Susan Shields is at higher than average risk for developing diabetes due to her obesity and insulin resistance. She currently denies polyuria or polydipsia.  ALLERGIES: No Known Allergies  MEDICATIONS: Current Outpatient Medications on File Prior to Visit  Medication Sig Dispense Refill  . cholecalciferol (VITAMIN D) 1000 units tablet Take 1,000 Units by mouth daily.    [name] Kitchen levothyroxine (SYNTHROID, LEVOTHROID) 125 MCG tablet Take 125 mcg by mouth daily before breakfast.    . LINZESS 290 MCG CAPS capsule Take 1 capsule (290 mcg total) by mouth daily. 90 capsule 3   No current facility-administered medications on file prior to  visit.     PAST MEDICAL HISTORY: Past Medical History:  Diagnosis Date  . Constipation   . Graves disease   . Hyperlipidemia   . IBS (irritable bowel syndrome)   . Thyroid disease   . Vitamin D deficiency     PAST SURGICAL HISTORY: Past Surgical History:  Procedure Laterality Date  . CESAREAN SECTION    . COLONOSCOPY      SOCIAL HISTORY: Social History   Tobacco Use  . Smoking status: Never Smoker  . Smokeless tobacco: Never Used  Substance Use Topics  . Alcohol use: Yes    Comment: occasional  . Drug use: No    FAMILY HISTORY: Family History  Problem Relation Age of Onset  . Diabetes Mother   . Cancer Mother        MMT Cancer  . Obesity Mother   . Throat cancer Father   . Sudden death Father   . Stroke Father     ROS: Review of Systems  Constitutional: Positive for weight loss.  Gastrointestinal: Negative for nausea and vomiting.  Genitourinary: Negative for frequency.  Musculoskeletal:       Negative for muscle weakness  Endo/Heme/Allergies: Negative for polydipsia.       Positive for polyphagia Negative for heat or cold intolerance    PHYSICAL EXAM: Blood pressure 130/80, pulse 83, temperature 98 F (36.7 C), temperature source Oral, height 5\' 5"  (1.651 m), weight 206 lb (93.4 kg), last menstrual period 12/02/2018, SpO2 98 %. Body mass index is 34.28 kg/m. Physical  Exam  Constitutional: She is oriented to person, place, and time. She appears well-developed and well-nourished.  Cardiovascular: Normal rate.  Pulmonary/Chest: Effort normal.  Musculoskeletal: Normal range of motion.  Neurological: She is oriented to person, place, and time.  Skin: Skin is warm and dry.  Psychiatric: She has a normal mood and affect. Her behavior is normal. She expresses no homicidal and no suicidal ideation.  Vitals reviewed.   RECENT LABS AND TESTS: BMET No results found for: NA, K, CL, CO2, GLUCOSE, BUN, CREATININE, CALCIUM, GFRNONAA, GFRAA No results found  for: HGBA1C Lab Results  Component Value Date   INSULIN 9.4 10/28/2018   CBC No results found for: WBC, RBC, HGB, HCT, PLT, MCV, MCH, MCHC, RDW, LYMPHSABS, MONOABS, EOSABS, BASOSABS Iron/TIBC/Ferritin/ %Sat No results found for: IRON, TIBC, FERRITIN, IRONPCTSAT Lipid Panel  No results found for: CHOL, TRIG, HDL, CHOLHDL, VLDL, LDLCALC, LDLDIRECT Hepatic Function Panel  No results found for: PROT, ALBUMIN, AST, ALT, ALKPHOS, BILITOT, BILIDIR, IBILI No results found for: TSH  ASSESSMENT AND PLAN: Vitamin D deficiency  Other specified hypothyroidism  Insulin resistance - Plan: metFORMIN (GLUCOPHAGE) 500 MG tablet  At risk for diabetes mellitus  Class 1 obesity with serious comorbidity and body mass index (BMI) of 34.0 to 34.9 in adult, unspecified obesity type  PLAN:  Vitamin D Deficiency Mylin was informed that low vitamin D levels contributes to fatigue and are associated with obesity, breast, and colon cancer. She will continue to take OTC Vit D @1 ,000 IU daily and will follow up for routine testing of vitamin D, at least 2-3 times per year. She was informed of the risk of over-replacement of vitamin D and agrees to not increase her dose unless she discusses this with us first.  Hypothyroidism Keylin was informed of the importance of good thyroid control to help with weight loss efforts. She was also informed that supertheraputic thyroid levels are dangerous and will not improve weight loss results. Susan Shields will continue to take levothyroxine and will follow up at the agreed upon time.  Insulin Resistance Susan Shields will continue to work on weight loss, exercise, and decreasing simple carbohydrates in her diet to help decrease the risk of diabetes. We dicussed metformin including benefits and risks. She was informed that eating too many simple carbohydrates or too many calories at one sitting increases the likelihood of GI side effects. Susan Shields agreed to start metformin 500 mg 1 tablet  by mouth with the evening meal #30 with no refills and follow up with us as directed to monitor her progress.  Diabetes risk counseling Susan Shields was given extended (15 minutes) diabetes prevention counseling today. She is 37 y.o. female and has risk factors for diabetes including obesity and insulin resistance. We discussed intensive lifestyle modifications today with an emphasis on weight loss as well as increasing exercise and decreasing simple carbohydrates in her diet.  Obesity Susan Shields is currently in the action stage of change. As such, her goal is to continue with weight loss efforts She has agreed to follow the Category 3 plan Susan Shields has been instructed to work up to a goal of 150 minutes of combined cardio and strengthening exercise per week for weight loss and overall health benefits. We discussed the following Behavioral Modification Strategies today: increase H2O intake, planning for success, increasing lean protein intake, decreasing simple carbohydrates , increasing vegetables, work on meal planning and easy cooking plans, holiday eating strategies  and emotional eating strategies She can move the food around. Smart fruit handout given  to patient today.  Susan Shields has agreed to follow up with our clinic in 2 weeks. She was informed of the importance of frequent follow up visits to maximize her success with intensive lifestyle modifications for her multiple health conditions.   OBESITY BEHAVIORAL INTERVENTION VISIT  Today's visit was # 4   Starting weight: 208 lbs Starting date: 10/28/2018 Today's weight : 206 lbs  Today's date: 12/07/2018 Total lbs lost to date: 2   ASK: We discussed the diagnosis of obesity with Susan Shields today and Susan Shields agreed to give [place] permission to discuss obesity behavioral modification therapy today.  ASSESS: Susan Shields has the diagnosis of obesity and her BMI today is 34.28 Susan Shields is in the action stage of change   ADVISE: Susan Shields was educated on the  multiple health risks of obesity as well as the benefit of weight loss to improve her health. She was advised of the need for long term treatment and the importance of lifestyle modifications to improve her current health and to decrease her risk of future health problems.  AGREE: Multiple dietary modification options and treatment options were discussed and  Susan Shields agreed to follow the recommendations documented in the above note.  ARRANGE: Acsa was educated on the importance of frequent visits to treat obesity as outlined per CMS and USPSTF guidelines and agreed to schedule her next follow up appointment today.  Cristi Loron, am acting as Energy manager for [employer]. Manson Passey, DO  I have reviewed the above documentation for accuracy and completeness, and I agree with the above. -Corinna Capra, DO

## 2018-12-14 ENCOUNTER — Telehealth: Payer: 59 | Admitting: Physician Assistant

## 2018-12-14 DIAGNOSIS — R12 Heartburn: Secondary | ICD-10-CM

## 2018-12-14 DIAGNOSIS — R05 Cough: Secondary | ICD-10-CM

## 2018-12-14 DIAGNOSIS — J029 Acute pharyngitis, unspecified: Secondary | ICD-10-CM

## 2018-12-14 DIAGNOSIS — R058 Other specified cough: Secondary | ICD-10-CM

## 2018-12-14 DIAGNOSIS — R079 Chest pain, unspecified: Secondary | ICD-10-CM

## 2018-12-14 NOTE — Progress Notes (Signed)
Based on what you shared with me it looks like you have a serious condition that should be evaluated in a face to face office visit.  NOTE: If you entered your credit card information for this eVisit, you will not be charged. You may see a "hold" on your card for the $30 but that hold will drop off and you will not have a charge processed.  If you are having a true medical emergency please call 911.  If you need an urgent face to face visit, [facility] has four urgent care centers for your convenience.  If you need care fast and have a high deductible or no insurance consider:   [email or web address] to reserve your spot online an avoid wait times  [facility] [address], [address] [place], [place] [address] 8 am to 8 pm Monday-Friday 10 am to 4 pm Saturday-Sunday *Across the street from [employer]  [facility]  [address] [place], [address] 8 am to 5 pm Monday-Friday * In the [facility]   The following sites will take your  insurance:  . [facility]  [phone] Get Driving Directions Find a Provider at this Location  [address] [place], [place] [address] . 10 am to 8 pm Monday-Friday . 12 pm to 8 pm Saturday-Sunday   . [facility]  [phone] Get Driving Directions Find a Provider at this Location  [address], [address] [place], [place] [address] . 8 am to 8 pm Monday-Friday . 9 am to 6 pm Saturday . 11 am to 6 pm Sunday   .  [facility] [place]  [phone] Get Driving Directions  [address] [place], [place] [address] . 8 am to 8 pm Monday-Friday . 8 am to 4 pm Saturday-Sunday   Your e-visit answers were reviewed by a board certified advanced clinical practitioner to complete your personal care plan.  Thank you for using e-Visits.  

## 2018-12-16 MED FILL — LINZESS 290 MCG CAPSULE: 290 | 90 days supply | Qty: 90 | Fill #3

## 2018-12-17 MED FILL — LEVOTHYROXINE 125 MCG TABLE: 125 | 90 days supply | Qty: 90 | Fill #0

## 2018-12-20 ENCOUNTER — Encounter (INDEPENDENT_AMBULATORY_CARE_PROVIDER_SITE_OTHER): Payer: Self-pay | Admitting: Bariatrics

## 2018-12-20 ENCOUNTER — Ambulatory Visit (INDEPENDENT_AMBULATORY_CARE_PROVIDER_SITE_OTHER): Payer: 59 | Admitting: Bariatrics

## 2018-12-20 VITALS — BP 120/86 | HR 84 | Temp 98.1°F | Ht 65.0 in | Wt 208.0 lb

## 2018-12-20 DIAGNOSIS — E669 Obesity, unspecified: Secondary | ICD-10-CM | POA: Diagnosis not present

## 2018-12-20 DIAGNOSIS — E038 Other specified hypothyroidism: Secondary | ICD-10-CM

## 2018-12-20 DIAGNOSIS — Z6834 Body mass index (BMI) 34.0-34.9, adult: Secondary | ICD-10-CM | POA: Diagnosis not present

## 2018-12-20 DIAGNOSIS — E8881 Metabolic syndrome: Secondary | ICD-10-CM

## 2018-12-27 NOTE — Progress Notes (Signed)
Office: [phone]  /  Fax: [phone]   HPI:   Chief Complaint: OBESITY Susan Shields is here to discuss her progress with her obesity treatment plan. She is on the Category 3 plan and is following her eating plan approximately 85 % of the time. She states she is doing Psychologist, educationalZumba, Wii fit and walking for 30 minutes 4 times per week. Susan Shields is struggling some with the plan. She had been sick for one week. She has an increase of 1.8 pounds for water weight. Susan Shields struggles to get in all of the protein. Her weight is 208 lb (94.3 kg) today and has had a weight gain of 2 pounds over a period of 2 weeks since her last visit. She has lost 0 lbs since starting treatment with us.  Insulin Resistance Susan Shields has a diagnosis of insulin resistance based on her elevated fasting insulin level >5. Although Susan Shields's blood glucose readings are still under good control, insulin resistance puts her at greater risk of metabolic syndrome and diabetes. She is taking metformin currently and continues to work on diet and exercise to decrease risk of diabetes.  Hypothyroidism Susan Shields has a diagnosis of hypothyroidism and she is currently well controlled. She is on levothyroxine. She denies hot or cold intolerance.  ASSESSMENT AND PLAN:  Insulin resistance  Other specified hypothyroidism  Class 1 obesity with serious comorbidity and body mass index (BMI) of 34.0 to 34.9 in adult, unspecified obesity type  PLAN:  Insulin Resistance Susan Shields will continue to work on weight loss, exercise, and decreasing simple carbohydrates in her diet to help decrease the risk of diabetes. We dicussed metformin including benefits and risks. She was informed that eating too many simple carbohydrates or too many calories at one sitting increases the likelihood of GI side effects. Susan Shields will continue metformin and follow up with us as directed to monitor her progress.  Hypothyroidism Susan Shields was informed of the importance of good thyroid  control to help with weight loss efforts. She was also informed that supertheraputic thyroid levels are dangerous and will not improve weight loss results. Susan Shields will continue her medications as prescribed and follow up as directed.  I spent > than 50% of the 15 minute visit on counseling as documented in the note.  Obesity Susan Shields is currently in the action stage of change. As such, her goal is to continue with weight loss efforts She has agreed to follow the Category 3 plan Susan Shields has been instructed to work up to a goal of 150 minutes of combined cardio and strengthening exercise per week for weight loss and overall health benefits. We discussed the following Behavioral Modification Strategies today: increase H2O intake, decrease any added salt, no skipping meals, increasing lean protein intake, decreasing simple carbohydrates, increasing vegetables and work on meal planning and easy cooking plans Susan Shields will move the protein around as needed.  Susan Shields has agreed to follow up with our clinic in 2 weeks. She was informed of the importance of frequent follow up visits to maximize her success with intensive lifestyle modifications for her multiple health conditions.  ALLERGIES: No Known Allergies  MEDICATIONS: Current Outpatient Medications on File Prior to Visit  Medication Sig Dispense Refill  . cholecalciferol (VITAMIN D) 1000 units tablet Take 1,000 Units by mouth daily.    [name] Kitchen. levothyroxine (SYNTHROID, LEVOTHROID) 125 MCG tablet Take 125 mcg by mouth daily before breakfast.    . LINZESS 290 MCG CAPS capsule Take 1 capsule (290 mcg total) by mouth daily. 90 capsule 3  .  metFORMIN (GLUCOPHAGE) 500 MG tablet Take 1 tablet (500 mg total) by mouth daily with breakfast. 30 tablet 0   No current facility-administered medications on file prior to visit.     PAST MEDICAL HISTORY: Past Medical History:  Diagnosis Date  . Constipation   . Graves disease   . Hyperlipidemia   . IBS (irritable  bowel syndrome)   . Thyroid disease   . Vitamin D deficiency     PAST SURGICAL HISTORY: Past Surgical History:  Procedure Laterality Date  . CESAREAN SECTION    . COLONOSCOPY      SOCIAL HISTORY: Social History   Tobacco Use  . Smoking status: Never Smoker  . Smokeless tobacco: Never Used  Substance Use Topics  . Alcohol use: Yes    Comment: occasional  . Drug use: No    FAMILY HISTORY: Family History  Problem Relation Age of Onset  . Diabetes Mother   . Cancer Mother        MMT Cancer  . Obesity Mother   . Throat cancer Father   . Sudden death Father   . Stroke Father     ROS: Review of Systems  Constitutional: Negative for weight loss.  Endo/Heme/Allergies:       Negative for hot or cold intolerance    PHYSICAL EXAM: Blood pressure 120/86, pulse 84, temperature 98.1 F (36.7 C), temperature source Oral, height 5\' 5"  (1.651 m), weight 208 lb (94.3 kg), last menstrual period 12/02/2018, SpO2 98 %. Body mass index is 34.61 kg/m. Physical Exam Vitals signs reviewed.  Constitutional:      Appearance: Normal appearance. She is well-developed. She is obese.  Cardiovascular:     Rate and Rhythm: Normal rate.  Pulmonary:     Effort: Pulmonary effort is normal.  Musculoskeletal: Normal range of motion.  Skin:    General: Skin is warm and dry.  Neurological:     Mental Status: She is alert and oriented to person, place, and time.  Psychiatric:        Mood and Affect: Mood normal.        Behavior: Behavior normal.     RECENT LABS AND TESTS: BMET No results found for: NA, K, CL, CO2, GLUCOSE, BUN, CREATININE, CALCIUM, GFRNONAA, GFRAA No results found for: HGBA1C Lab Results  Component Value Date   INSULIN 9.4 10/28/2018   CBC No results found for: WBC, RBC, HGB, HCT, PLT, MCV, MCH, MCHC, RDW, LYMPHSABS, MONOABS, EOSABS, BASOSABS Iron/TIBC/Ferritin/ %Sat No results found for: IRON, TIBC, FERRITIN, IRONPCTSAT Lipid Panel  No results found for: CHOL,  TRIG, HDL, CHOLHDL, VLDL, LDLCALC, LDLDIRECT Hepatic Function Panel  No results found for: PROT, ALBUMIN, AST, ALT, ALKPHOS, BILITOT, BILIDIR, IBILI No results found for: TSH  Vitamin D There are no recent lab results   OBESITY BEHAVIORAL INTERVENTION VISIT  Today's visit was # 5   Starting weight: 208 lbs Starting date: 10/28/2018 Today's weight : 208 lbs Today's date: 12/20/2018 Total lbs lost to date: 0  ASK: We discussed the diagnosis of obesity with Montanna R Culley today and Gayathri agreed to give us permission to discuss obesity behavioral modification therapy today.  ASSESS: Susan Shields has the diagnosis of obesity and her BMI today is 34.61 Calena is in the action stage of change   ADVISE: Susan Shields was educated on the multiple health risks of obesity as well as the benefit of weight loss to improve her health. She was advised of the need for long term treatment and the importance  of lifestyle modifications to improve her current health and to decrease her risk of future health problems.  AGREE: Multiple dietary modification options and treatment options were discussed and  Magenta agreed to follow the recommendations documented in the above note.  ARRANGE: Brian was educated on the importance of frequent visits to treat obesity as outlined per CMS and USPSTF guidelines and agreed to schedule her next follow up appointment today.  Cristi Loron, am acting as Energy manager for [employer]. Manson Passey, DO  I have reviewed the above documentation for accuracy and completeness, and I agree with the above. -Corinna Capra, DO

## 2018-12-30 ENCOUNTER — Other Ambulatory Visit (INDEPENDENT_AMBULATORY_CARE_PROVIDER_SITE_OTHER): Payer: Self-pay | Admitting: Bariatrics

## 2018-12-30 DIAGNOSIS — E8881 Metabolic syndrome: Secondary | ICD-10-CM

## 2018-12-30 MED ORDER — METFORMIN HCL 500 MG PO TABS: 500.0000 mg | ORAL_TABLET | Freq: Every day | ORAL | 0 refills | Status: DC

## 2018-12-31 ENCOUNTER — Encounter (INDEPENDENT_AMBULATORY_CARE_PROVIDER_SITE_OTHER): Payer: Self-pay | Admitting: Bariatrics

## 2019-01-03 ENCOUNTER — Other Ambulatory Visit (INDEPENDENT_AMBULATORY_CARE_PROVIDER_SITE_OTHER): Payer: Self-pay | Admitting: Bariatrics

## 2019-01-03 DIAGNOSIS — E8881 Metabolic syndrome: Secondary | ICD-10-CM

## 2019-01-03 MED ORDER — METFORMIN HCL 500 MG PO TABS: 500.0000 mg | ORAL_TABLET | Freq: Every day | ORAL | 0 refills | Status: DC

## 2019-01-03 MED FILL — metFORMIN HCL 500 MG TABS: 500 | 30 days supply | Qty: 30 | Fill #0

## 2019-01-10 ENCOUNTER — Ambulatory Visit (INDEPENDENT_AMBULATORY_CARE_PROVIDER_SITE_OTHER): Payer: 59 | Admitting: Family Medicine

## 2019-01-10 ENCOUNTER — Encounter (INDEPENDENT_AMBULATORY_CARE_PROVIDER_SITE_OTHER): Payer: Self-pay | Admitting: Family Medicine

## 2019-01-10 VITALS — BP 116/77 | HR 78 | Temp 98.3°F | Ht 65.0 in | Wt 208.0 lb

## 2019-01-10 DIAGNOSIS — E669 Obesity, unspecified: Secondary | ICD-10-CM

## 2019-01-10 DIAGNOSIS — E8881 Metabolic syndrome: Secondary | ICD-10-CM

## 2019-01-10 DIAGNOSIS — E038 Other specified hypothyroidism: Secondary | ICD-10-CM | POA: Diagnosis not present

## 2019-01-10 DIAGNOSIS — Z6834 Body mass index (BMI) 34.0-34.9, adult: Secondary | ICD-10-CM

## 2019-01-10 NOTE — Progress Notes (Signed)
Office: [phone]  /  Fax: [phone]   HPI:   Chief Complaint: OBESITY Lonia FarberJavata is here to discuss her progress with her obesity treatment plan. She is on the Category 3 plan and is following her eating plan approximately 90 % of the time. She states she is walking and doing cardio for 30 minutes 7 times per week. Lonia FarberJavata has been exercising daily on the Wii. She still struggles with dinner protein content. She foresees no obstacle in the next couple of weeks.  Her weight is 208 lb (94.3 kg) today and has not lost weight since her last visit. She has lost 0 lbs since starting treatment with us.  Hypothyroidism Tahliyah has a diagnosis of hypothyroidism. She is on Synthroid and sees Dr. Wynelle LinkSun for management. She denies hot or cold intolerance or palpitations.  Insulin Resistance Jessamine has a diagnosis of insulin resistance based on her elevated fasting insulin level >5. Last insulin of 9.4. Although Tanga's blood glucose readings are still under good control, insulin resistance puts her at greater risk of metabolic syndrome and diabetes. She is taking metformin currently and she denies GI side effects. She continues to work on diet and exercise to decrease risk of diabetes.  ASSESSMENT AND PLAN:  Other specified hypothyroidism  Insulin resistance  Class 1 obesity with serious comorbidity and body mass index (BMI) of 34.0 to 34.9 in adult, unspecified obesity type  PLAN:  Hypothyroidism Atalie was informed of the importance of good thyroid control to help with weight loss efforts. She was also informed that supertheraputic thyroid levels are dangerous and will not improve weight loss results. Lonia FarberJavata will continue to follow up with Dr. Wynelle LinkSun, and she agrees to follow up with our clinic in 2 weeks.  Insulin Resistance Jeily will continue to work on weight loss, exercise, and decreasing simple carbohydrates in her diet to help decrease the risk of diabetes. We dicussed metformin including  benefits and risks. She was informed that eating too many simple carbohydrates or too many calories at one sitting increases the likelihood of GI side effects. Jaime agrees to continue taking metformin ans she agrees to follow up with our clinic in 2 weeks as directed to monitor her progress.  I spent > than 50% of the 15 minute visit on counseling as documented in the note.  Obesity Lonia FarberJavata is currently in the action stage of change. As such, her goal is to continue with weight loss efforts She has agreed to follow the Category 3 plan Lonia FarberJavata has been instructed to work up to a goal of 150 minutes of combined cardio and strengthening exercise per week or plan to add resistance training for 15-30 minutes 2 times per week for weight loss and overall health benefits. We discussed the following Behavioral Modification Strategies today: increasing lean protein intake, increasing vegetables, work on meal planning and easy cooking plans, and planning for success   Lonia FarberJavata has agreed to follow up with our clinic in 2 weeks. She was informed of the importance of frequent follow up visits to maximize her success with intensive lifestyle modifications for her multiple health conditions.  ALLERGIES: No Known Allergies  MEDICATIONS: Current Outpatient Medications on File Prior to Visit  Medication Sig Dispense Refill  . cholecalciferol (VITAMIN D) 1000 units tablet Take 1,000 Units by mouth daily.    [name] Kitchen. levothyroxine (SYNTHROID, LEVOTHROID) 125 MCG tablet Take 125 mcg by mouth daily before breakfast.    . LINZESS 290 MCG CAPS capsule Take 1 capsule (290 mcg  total) by mouth daily. 90 capsule 3  . metFORMIN (GLUCOPHAGE) 500 MG tablet Take 1 tablet (500 mg total) by mouth daily with breakfast. 30 tablet 0   No current facility-administered medications on file prior to visit.     PAST MEDICAL HISTORY: Past Medical History:  Diagnosis Date  . Constipation   . Graves disease   . Hyperlipidemia   . IBS  (irritable bowel syndrome)   . Thyroid disease   . Vitamin D deficiency     PAST SURGICAL HISTORY: Past Surgical History:  Procedure Laterality Date  . CESAREAN SECTION    . COLONOSCOPY      SOCIAL HISTORY: Social History   Tobacco Use  . Smoking status: Never Smoker  . Smokeless tobacco: Never Used  Substance Use Topics  . Alcohol use: Yes    Comment: occasional  . Drug use: No    FAMILY HISTORY: Family History  Problem Relation Age of Onset  . Diabetes Mother   . Cancer Mother        MMT Cancer  . Obesity Mother   . Throat cancer Father   . Sudden death Father   . Stroke Father     ROS: Review of Systems  Constitutional: Negative for weight loss.  Cardiovascular: Negative for palpitations.  Endo/Heme/Allergies:       Negative hot/cold intolerance    PHYSICAL EXAM: Blood pressure 116/77, pulse 78, temperature 98.3 F (36.8 C), temperature source Oral, height 5\' 5"  (1.651 m), weight 208 lb (94.3 kg), SpO2 98 %. Body mass index is 34.61 kg/m. Physical Exam Vitals signs reviewed.  Constitutional:      Appearance: Normal appearance. She is obese.  Cardiovascular:     Rate and Rhythm: Normal rate.     Pulses: Normal pulses.  Pulmonary:     Effort: Pulmonary effort is normal.     Breath sounds: Normal breath sounds.  Musculoskeletal: Normal range of motion.  Skin:    General: Skin is warm and dry.  Neurological:     Mental Status: She is alert and oriented to person, place, and time.  Psychiatric:        Mood and Affect: Mood normal.        Behavior: Behavior normal.     RECENT LABS AND TESTS: BMET No results found for: NA, K, CL, CO2, GLUCOSE, BUN, CREATININE, CALCIUM, GFRNONAA, GFRAA No results found for: HGBA1C Lab Results  Component Value Date   INSULIN 9.4 10/28/2018   CBC No results found for: WBC, RBC, HGB, HCT, PLT, MCV, MCH, MCHC, RDW, LYMPHSABS, MONOABS, EOSABS, BASOSABS Iron/TIBC/Ferritin/ %Sat No results found for: IRON, TIBC,  FERRITIN, IRONPCTSAT Lipid Panel  No results found for: CHOL, TRIG, HDL, CHOLHDL, VLDL, LDLCALC, LDLDIRECT Hepatic Function Panel  No results found for: PROT, ALBUMIN, AST, ALT, ALKPHOS, BILITOT, BILIDIR, IBILI No results found for: TSH    OBESITY BEHAVIORAL INTERVENTION VISIT  Today's visit was # 6   Starting weight: 208 lbs Starting date: 10/28/18 Today's weight : 208 lbs Today's date: 01/10/2019 Total lbs lost to date: 0    ASK: We discussed the diagnosis of obesity with Baillie R Tu today and Deleah agreed to give us permission to discuss obesity behavioral modification therapy today.  ASSESS: Lonia FarberJavata has the diagnosis of obesity and her BMI today is 34.61 Stacie is in the action stage of change   ADVISE: Lonia FarberJavata was educated on the multiple health risks of obesity as well as the benefit of weight loss to improve her  health. She was advised of the need for long term treatment and the importance of lifestyle modifications to improve her current health and to decrease her risk of future health problems.  AGREE: Multiple dietary modification options and treatment options were discussed and  Yvonda agreed to follow the recommendations documented in the above note.  ARRANGE: Londyn was educated on the importance of frequent visits to treat obesity as outlined per CMS and USPSTF guidelines and agreed to schedule her next follow up appointment today.  I, Burt Knack, am acting as transcriptionist for Debbra Riding, MD  I have reviewed the above documentation for accuracy and completeness, and I agree with the above. - Debbra Riding, MD

## 2019-01-12 MED FILL — VIT D2 1.25 MG (50,000 UNIT: 1.25 MG | 90 days supply | Qty: 13 | Fill #0

## 2019-01-19 ENCOUNTER — Encounter: Payer: Self-pay | Admitting: Family Medicine

## 2019-01-19 DIAGNOSIS — E559 Vitamin D deficiency, unspecified: Secondary | ICD-10-CM | POA: Diagnosis not present

## 2019-01-19 DIAGNOSIS — E890 Postprocedural hypothyroidism: Secondary | ICD-10-CM | POA: Diagnosis not present

## 2019-01-19 DIAGNOSIS — R7303 Prediabetes: Secondary | ICD-10-CM | POA: Diagnosis not present

## 2019-01-19 DIAGNOSIS — E6609 Other obesity due to excess calories: Secondary | ICD-10-CM | POA: Diagnosis not present

## 2019-01-25 ENCOUNTER — Ambulatory Visit (INDEPENDENT_AMBULATORY_CARE_PROVIDER_SITE_OTHER): Payer: 59 | Admitting: Family Medicine

## 2019-01-25 ENCOUNTER — Encounter (INDEPENDENT_AMBULATORY_CARE_PROVIDER_SITE_OTHER): Payer: Self-pay | Admitting: Family Medicine

## 2019-01-25 VITALS — BP 116/74 | HR 73 | Temp 97.9°F | Ht 65.0 in | Wt 207.0 lb

## 2019-01-25 DIAGNOSIS — E669 Obesity, unspecified: Secondary | ICD-10-CM

## 2019-01-25 DIAGNOSIS — Z9189 Other specified personal risk factors, not elsewhere classified: Secondary | ICD-10-CM

## 2019-01-25 DIAGNOSIS — Z6834 Body mass index (BMI) 34.0-34.9, adult: Secondary | ICD-10-CM | POA: Diagnosis not present

## 2019-01-25 DIAGNOSIS — E8881 Metabolic syndrome: Secondary | ICD-10-CM

## 2019-01-25 DIAGNOSIS — E038 Other specified hypothyroidism: Secondary | ICD-10-CM | POA: Diagnosis not present

## 2019-01-25 DIAGNOSIS — E559 Vitamin D deficiency, unspecified: Secondary | ICD-10-CM | POA: Diagnosis not present

## 2019-01-25 MED ORDER — METFORMIN HCL 500 MG PO TABS: 500.0000 mg | ORAL_TABLET | Freq: Every day | ORAL | 0 refills | Status: DC

## 2019-01-26 LAB — INSULIN, RANDOM: INSULIN: 13 u[IU]/mL (ref 2.6–24.9)

## 2019-01-26 LAB — HEMOGLOBIN A1C
Est. average glucose Bld gHb Est-mCnc: 108 mg/dL
Hgb A1c MFr Bld: 5.4 % (ref 4.8–5.6)

## 2019-01-26 NOTE — Progress Notes (Signed)
Office: [phone]  /  Fax: [phone]   HPI:   Chief Complaint: OBESITY Susan Shields is here to discuss her progress with her obesity treatment plan. She is on the Category 3 plan and is following her eating plan approximately 95 % of the time. She states she is walking and at the gym for 30-45 minutes 5 times per week. Susan Shields feels the past few weeks have been very easy to follow the meal plan. She is getting in a total of 8 oz of protein for dinner meal.  Her weight is 207 lb (93.9 kg) today and has had a weight loss of 1 pound over a period of 2 weeks since her last visit. She has lost 1 lb since starting treatment with us.  Insulin Resistance Susan Shields has a diagnosis of insulin resistance based on her elevated fasting insulin level >5. Although Susan Shields's blood glucose readings are still under good control, insulin resistance puts her at greater risk of metabolic syndrome and diabetes. She denies GI side effects of metformin and continues to work on diet and exercise to decrease risk of diabetes. She denies hypoglycemia  At risk for diabetes Susan Shields is at higher than average risk for developing diabetes due to her obesity and insulin resistance. She currently denies polyuria or polydipsia.  Vitamin D Deficiency Susan Shields has a diagnosis of vitamin D deficiency. She is not currently taking Vit D. Her recent labs are pending, and previous Vit D value was 36.2. She denies nausea, vomiting or muscle weakness.  Hypothyroidism Susan Shields has a diagnosis of hypothyroidism. She is on levothyroxine. Her TSH is within normal limits on 01/19/2019. She denies hot or cold intolerance, palpitations, or fatigue.  ASSESSMENT AND PLAN:  Insulin resistance - Plan: Hemoglobin A1c, Insulin, random, metFORMIN (GLUCOPHAGE) 500 MG tablet  Vitamin D deficiency  Other specified hypothyroidism  At risk for diabetes mellitus  Class 1 obesity with serious comorbidity and body mass index (BMI) of 34.0 to 34.9 in adult,  unspecified obesity type  PLAN:  Insulin Resistance Susan Shields will continue to work on weight loss, exercise, and decreasing simple carbohydrates in her diet to help decrease the risk of diabetes. We dicussed metformin including benefits and risks. She was informed that eating too many simple carbohydrates or too many calories at one sitting increases the likelihood of GI side effects. Verdis agrees to continue taking metformin 500 mg PO daily #30 and we will refill for 1 month. We will check Hgb A1c and insulin today. Susan Shields agrees to follow up with our clinic in 2 weeks as directed to monitor her progress.  Diabetes risk counselling Susan Shields was given extended (15 minutes) diabetes prevention counseling today. She is 38 y.o. female and has risk factors for diabetes including obesity and insulin resistance. We discussed intensive lifestyle modifications today with an emphasis on weight loss as well as increasing exercise and decreasing simple carbohydrates in her diet.  Vitamin D Deficiency Susan Shields was informed that low vitamin D levels contributes to fatigue and are associated with obesity, breast, and colon cancer. She will follow up for routine testing of vitamin D, at least 2-3 times per year. She was informed of the risk of over-replacement of vitamin D and agrees to not increase her dose unless she discusses this with us first. We will follow up on Vit D labs. Susan Shields agrees to follow up with our clinic in 2 weeks.  Hypothyroidism Susan Shields was informed of the importance of good thyroid control to help with weight loss efforts. She  was also informed that supertheraputic thyroid levels are dangerous and will not improve weight loss results. Susan Shields will follow up with her primary care physician, and she agrees to follow up with our clinic in 2 weeks.   Obesity Susan Shields is currently in the action stage of change. As such, her goal is to continue with weight loss efforts She has agreed to follow the  Category 3 plan [name] has been instructed to work up to a goal of 150 minutes of combined cardio and strengthening exercise per week for weight loss and overall health benefits. We discussed the following Behavioral Modification Strategies today: increasing lean protein intake, increasing vegetables and work on meal planning and easy cooking plans, and planning for success   Susan Shields has agreed to follow up with our clinic in 2 weeks. She was informed of the importance of frequent follow up visits to maximize her success with intensive lifestyle modifications for her multiple health conditions.  ALLERGIES: No Known Allergies  MEDICATIONS: Current Outpatient Medications on File Prior to Visit  Medication Sig Dispense Refill  . cholecalciferol (VITAMIN D) 1000 units tablet Take 1,000 Units by mouth daily.    Susan Shields levothyroxine (SYNTHROID, LEVOTHROID) 125 MCG tablet Take 125 mcg by mouth daily before breakfast.    . LINZESS 290 MCG CAPS capsule Take 1 capsule (290 mcg total) by mouth daily. 90 capsule 3   No current facility-administered medications on file prior to visit.     PAST MEDICAL HISTORY: Past Medical History:  Diagnosis Date  . Constipation   . Graves disease   . Hyperlipidemia   . IBS (irritable bowel syndrome)   . Thyroid disease   . Vitamin D deficiency     PAST SURGICAL HISTORY: Past Surgical History:  Procedure Laterality Date  . CESAREAN SECTION    . COLONOSCOPY      SOCIAL HISTORY: Social History   Tobacco Use  . Smoking status: Never Smoker  . Smokeless tobacco: Never Used  Substance Use Topics  . Alcohol use: Yes    Comment: occasional  . Drug use: No    FAMILY HISTORY: Family History  Problem Relation Age of Onset  . Diabetes Mother   . Cancer Mother        MMT Cancer  . Obesity Mother   . Throat cancer Father   . Sudden death Father   . Stroke Father     ROS: Review of Systems  Constitutional: Positive for weight loss. Negative for  malaise/fatigue.  Cardiovascular: Negative for palpitations.  Gastrointestinal: Negative for nausea and vomiting.  Genitourinary: Negative for frequency.  Musculoskeletal:       Negative muscle weakness  Endo/Heme/Allergies: Negative for polydipsia.       Negative hypoglycemia Negative hot/cold intolerance    PHYSICAL EXAM: Blood pressure 116/74, pulse 73, temperature 97.9 F (36.6 C), temperature source Oral, height 5\' 5"  (1.651 m), weight 207 lb (93.9 kg), SpO2 100 %. Body mass index is 34.45 kg/m. Physical Exam Vitals signs reviewed.  Constitutional:      Appearance: Normal appearance. She is obese.  Cardiovascular:     Rate and Rhythm: Normal rate.     Pulses: Normal pulses.  Pulmonary:     Effort: Pulmonary effort is normal.     Breath sounds: Normal breath sounds.  Skin:    General: Skin is warm and dry.  Neurological:     Mental Status: She is alert and oriented to person, place, and time.  Psychiatric:  Mood and Affect: Mood normal.        Behavior: Behavior normal.     RECENT LABS AND TESTS: BMET No results found for: NA, K, CL, CO2, GLUCOSE, BUN, CREATININE, CALCIUM, GFRNONAA, GFRAA Lab Results  Component Value Date   HGBA1C 5.4 01/25/2019   Lab Results  Component Value Date   INSULIN 13.0 01/25/2019   INSULIN 9.4 10/28/2018   CBC No results found for: WBC, RBC, HGB, HCT, PLT, MCV, MCH, MCHC, RDW, LYMPHSABS, MONOABS, EOSABS, BASOSABS Iron/TIBC/Ferritin/ %Sat No results found for: IRON, TIBC, FERRITIN, IRONPCTSAT Lipid Panel  No results found for: CHOL, TRIG, HDL, CHOLHDL, VLDL, LDLCALC, LDLDIRECT Hepatic Function Panel  No results found for: PROT, ALBUMIN, AST, ALT, ALKPHOS, BILITOT, BILIDIR, IBILI No results found for: TSH    OBESITY BEHAVIORAL INTERVENTION VISIT  Today's visit was # 7   Starting weight: 208 lbs Starting date: 10/28/18 Today's weight : 207 lbs  Today's date: 01/25/2019 Total lbs lost to date: 1    ASK: We  discussed the diagnosis of obesity with Jaimya R Digman today and Elizah agreed to give [place] permission to discuss obesity behavioral modification therapy today.  ASSESS: Adelinne has the diagnosis of obesity and her BMI today is 34.45 Terea is in the action stage of change   ADVISE: Burnadine was educated on the multiple health risks of obesity as well as the benefit of weight loss to improve her health. She was advised of the need for long term treatment and the importance of lifestyle modifications to improve her current health and to decrease her risk of future health problems.  AGREE: Multiple dietary modification options and treatment options were discussed and  Adrian agreed to follow the recommendations documented in the above note.  ARRANGE: Sandee was educated on the importance of frequent visits to treat obesity as outlined per CMS and USPSTF guidelines and agreed to schedule her next follow up appointment today.  I, Burt Knack, am acting as transcriptionist for Debbra Riding, MD  I have reviewed the above documentation for accuracy and completeness, and I agree with the above. - Debbra Riding, MD

## 2019-02-01 DIAGNOSIS — Z01419 Encounter for gynecological examination (general) (routine) without abnormal findings: Secondary | ICD-10-CM | POA: Diagnosis not present

## 2019-02-01 DIAGNOSIS — Z3202 Encounter for pregnancy test, result negative: Secondary | ICD-10-CM | POA: Diagnosis not present

## 2019-02-01 DIAGNOSIS — Z1151 Encounter for screening for human papillomavirus (HPV): Secondary | ICD-10-CM | POA: Diagnosis not present

## 2019-02-01 DIAGNOSIS — Z6835 Body mass index (BMI) 35.0-35.9, adult: Secondary | ICD-10-CM | POA: Diagnosis not present

## 2019-02-02 MED FILL — metFORMIN HCL 500 MG TABS: 500 | 30 days supply | Qty: 30 | Fill #0

## 2019-02-08 ENCOUNTER — Encounter (INDEPENDENT_AMBULATORY_CARE_PROVIDER_SITE_OTHER): Payer: Self-pay | Admitting: Family Medicine

## 2019-02-08 ENCOUNTER — Ambulatory Visit (INDEPENDENT_AMBULATORY_CARE_PROVIDER_SITE_OTHER): Payer: 59 | Admitting: Family Medicine

## 2019-02-08 VITALS — BP 119/87 | HR 69 | Temp 97.8°F | Ht 65.0 in | Wt 209.0 lb

## 2019-02-08 DIAGNOSIS — Z6834 Body mass index (BMI) 34.0-34.9, adult: Secondary | ICD-10-CM | POA: Diagnosis not present

## 2019-02-08 DIAGNOSIS — E669 Obesity, unspecified: Secondary | ICD-10-CM

## 2019-02-08 DIAGNOSIS — E559 Vitamin D deficiency, unspecified: Secondary | ICD-10-CM | POA: Diagnosis not present

## 2019-02-08 DIAGNOSIS — E8881 Metabolic syndrome: Secondary | ICD-10-CM | POA: Diagnosis not present

## 2019-02-09 NOTE — Progress Notes (Signed)
Office: [phone]  /  Fax: [phone]   HPI:   Chief Complaint: OBESITY Susan Shields is here to discuss her progress with her obesity treatment plan. She is on the Category 3 plan and is following her eating plan approximately 95 % of the time. She states she is doing cardio and strength training for 45 minutes 3-4 times per week. Susan Shields is making some substitutions for breakfast. She is not getting in all protein during the day, except if she makes protein rich snack choices.  Her weight is 209 lb (94.8 kg) today and has gained 2 pounds since her last visit. She has lost 0 lbs since starting treatment with [place].  Vitamin D Deficiency Susan Shields has a diagnosis of vitamin D deficiency. She is currently taking Vit D. Last Vit D level was 40.2 on 01/19/2019. She notes fatigue and denies nausea, vomiting or muscle weakness.  Insulin Resistance Susan Shields has a diagnosis of insulin resistance based on her elevated fasting insulin level >5. Although Susan Shields's blood glucose readings are still under good control, insulin resistance puts her at greater risk of metabolic syndrome and diabetes. She denies carbohydrate cravings and denies GI side effects of metformin. She continues to work on diet and exercise to decrease risk of diabetes.  ASSESSMENT AND PLAN:  Vitamin D deficiency  Insulin resistance  Class 1 obesity with serious comorbidity and body mass index (BMI) of 34.0 to 34.9 in adult, unspecified obesity type  PLAN:  Vitamin D Deficiency Landree was informed that low vitamin D levels contributes to fatigue and are associated with obesity, breast, and colon cancer. Zonnie agrees to continue taking Vit D 1,000 IU daily, no refill needed. She will follow up for routine testing of vitamin D, at least 2-3 times per year. She was informed of the risk of over-replacement of vitamin D and agrees to not increase her dose unless she discusses this with [place] first. Susan Shields agrees to follow up with our clinic in 2  weeks.  Insulin Resistance Susan Shields will continue to work on weight loss, exercise, and decreasing simple carbohydrates in her diet to help decrease the risk of diabetes. We dicussed metformin including benefits and risks. She was informed that eating too many simple carbohydrates or too many calories at one sitting increases the likelihood of GI side effects. Susan Shields agrees to continue taking metformin, and she agrees to follow up with our clinic in 2 weeks as directed to monitor her progress.  I spent > than 50% of the 15 minute visit on counseling as documented in the note.  Obesity Susan Shields is currently in the action stage of change. As such, her goal is to continue with weight loss efforts She has agreed to follow the Category 3 plan Susan Shields has been instructed to work up to a goal of 150 minutes of combined cardio and strengthening exercise per week for weight loss and overall health benefits. We discussed the following Behavioral Modification Strategies today: increasing lean protein intake, increasing vegetables and work on meal planning and easy cooking plans, and planning for success We will repeat IC at next visit.  Susan Shields has agreed to follow up with our clinic in 2 weeks. She was informed of the importance of frequent follow up visits to maximize her success with intensive lifestyle modifications for her multiple health conditions.  ALLERGIES: No Known Allergies  MEDICATIONS: Current Outpatient Medications on File Prior to Visit  Medication Sig Dispense Refill  . cholecalciferol (VITAMIN D) 1000 units tablet Take 1,000 Units by  mouth daily.    [name] Kitchen levothyroxine (SYNTHROID, LEVOTHROID) 125 MCG tablet Take 125 mcg by mouth daily before breakfast.    . LINZESS 290 MCG CAPS capsule Take 1 capsule (290 mcg total) by mouth daily. 90 capsule 3  . metFORMIN (GLUCOPHAGE) 500 MG tablet Take 1 tablet (500 mg total) by mouth daily with breakfast. 30 tablet 0   No current facility-administered  medications on file prior to visit.     PAST MEDICAL HISTORY: Past Medical History:  Diagnosis Date  . Constipation   . Graves disease   . Hyperlipidemia   . IBS (irritable bowel syndrome)   . Thyroid disease   . Vitamin D deficiency     PAST SURGICAL HISTORY: Past Surgical History:  Procedure Laterality Date  . CESAREAN SECTION    . COLONOSCOPY      SOCIAL HISTORY: Social History   Tobacco Use  . Smoking status: Never Smoker  . Smokeless tobacco: Never Used  Substance Use Topics  . Alcohol use: Yes    Comment: occasional  . Drug use: No    FAMILY HISTORY: Family History  Problem Relation Age of Onset  . Diabetes Mother   . Cancer Mother        MMT Cancer  . Obesity Mother   . Throat cancer Father   . Sudden death Father   . Stroke Father     ROS: Review of Systems  Constitutional: Positive for malaise/fatigue. Negative for weight loss.  Gastrointestinal: Negative for nausea and vomiting.  Musculoskeletal:       Negative muscle weakness    PHYSICAL EXAM: Blood pressure 119/87, pulse 69, temperature 97.8 F (36.6 C), temperature source Oral, height 5\' 5"  (1.651 m), weight 209 lb (94.8 kg), last menstrual period 01/25/2019, SpO2 99 %. Body mass index is 34.78 kg/m. Physical Exam Vitals signs reviewed.  Constitutional:      Appearance: Normal appearance. She is obese.  Cardiovascular:     Rate and Rhythm: Normal rate.     Pulses: Normal pulses.  Pulmonary:     Effort: Pulmonary effort is normal.     Breath sounds: Normal breath sounds.  Musculoskeletal: Normal range of motion.  Skin:    General: Skin is warm and dry.  Neurological:     Mental Status: She is alert and oriented to person, place, and time.  Psychiatric:        Mood and Affect: Mood normal.        Behavior: Behavior normal.     RECENT LABS AND TESTS: BMET No results found for: NA, K, CL, CO2, GLUCOSE, BUN, CREATININE, CALCIUM, GFRNONAA, GFRAA Lab Results  Component Value  Date   HGBA1C 5.4 01/25/2019   Lab Results  Component Value Date   INSULIN 13.0 01/25/2019   INSULIN 9.4 10/28/2018   CBC No results found for: WBC, RBC, HGB, HCT, PLT, MCV, MCH, MCHC, RDW, LYMPHSABS, MONOABS, EOSABS, BASOSABS Iron/TIBC/Ferritin/ %Sat No results found for: IRON, TIBC, FERRITIN, IRONPCTSAT Lipid Panel  No results found for: CHOL, TRIG, HDL, CHOLHDL, VLDL, LDLCALC, LDLDIRECT Hepatic Function Panel  No results found for: PROT, ALBUMIN, AST, ALT, ALKPHOS, BILITOT, BILIDIR, IBILI No results found for: TSH    OBESITY BEHAVIORAL INTERVENTION VISIT  Today's visit was # 8   Starting weight: 208 lbs Starting date: 10/28/18 Today's weight : 209 lbs  Today's date: 02/08/2019 Total lbs lost to date: 0    ASK: We discussed the diagnosis of obesity with Lanisa R Golembeski today and Zakiah agreed  to give us permission to discuss obesity behavioral modification therapy today.  ASSESS: Susan Shields has the diagnosis of obesity and her BMI today is 34.78 Taria is in the action stage of change   ADVISE: Susan Shields was educated on the multiple health risks of obesity as well as the benefit of weight loss to improve her health. She was advised of the need for long term treatment and the importance of lifestyle modifications to improve her current health and to decrease her risk of future health problems.  AGREE: Multiple dietary modification options and treatment options were discussed and  Susan Shields agreed to follow the recommendations documented in the above note.  ARRANGE: Susan Shields was educated on the importance of frequent visits to treat obesity as outlined per CMS and USPSTF guidelines and agreed to schedule her next follow up appointment today.  I, Burt KnackSharon Martin, am acting as transcriptionist for Debbra RidingAlexandria Kadolph, MD  I have reviewed the above documentation for accuracy and completeness, and I agree with the above. - Debbra RidingAlexandria Kadolph, MD

## 2019-02-22 ENCOUNTER — Ambulatory Visit (INDEPENDENT_AMBULATORY_CARE_PROVIDER_SITE_OTHER): Payer: 59 | Admitting: Family Medicine

## 2019-02-22 ENCOUNTER — Encounter (INDEPENDENT_AMBULATORY_CARE_PROVIDER_SITE_OTHER): Payer: Self-pay | Admitting: Family Medicine

## 2019-02-22 VITALS — BP 126/83 | HR 71 | Temp 97.9°F | Ht 65.0 in | Wt 207.0 lb

## 2019-02-22 DIAGNOSIS — E8881 Metabolic syndrome: Secondary | ICD-10-CM | POA: Diagnosis not present

## 2019-02-22 DIAGNOSIS — R0602 Shortness of breath: Secondary | ICD-10-CM

## 2019-02-22 DIAGNOSIS — E669 Obesity, unspecified: Secondary | ICD-10-CM

## 2019-02-22 DIAGNOSIS — Z9189 Other specified personal risk factors, not elsewhere classified: Secondary | ICD-10-CM | POA: Diagnosis not present

## 2019-02-22 DIAGNOSIS — Z6834 Body mass index (BMI) 34.0-34.9, adult: Secondary | ICD-10-CM | POA: Diagnosis not present

## 2019-02-22 DIAGNOSIS — E038 Other specified hypothyroidism: Secondary | ICD-10-CM

## 2019-02-22 MED ORDER — METFORMIN HCL 500 MG PO TABS: 500.0000 mg | ORAL_TABLET | Freq: Every day | ORAL | 0 refills | Status: DC

## 2019-02-23 NOTE — Progress Notes (Signed)
Office: [phone]  /  Fax: [phone]   HPI:   Chief Complaint: OBESITY Susan Shields is here to discuss her progress with her obesity treatment plan. She is on the Category 3 plan and is following her eating plan approximately 95 % of the time. She states she is exercising 0 minutes 0 times per week. Susan Shields is doing well Monday through Friday, but struggling on weekends to get all food in. Her son is starting driver's education in a few weeks, schedule to change.  Her weight is 207 lb (93.9 kg) today and has had a weight loss of 2 pounds over a period of 2 weeks since her last visit. She has lost 1 lb since starting treatment with [place].  Insulin Resistance Susan Shields has a diagnosis of insulin resistance based on her elevated fasting insulin level >5. Although Susan Shields's blood glucose readings are still under good control, insulin resistance puts her at greater risk of metabolic syndrome and diabetes. She notes carbohydrate cravings, but using snack calories to satisfy her cravings. She denied GI side effects of metformin and continues to work on diet and exercise to decrease risk of diabetes.  At risk for diabetes Susan Shields is at higher than average risk for developing diabetes due to her obesity and insulin resistance. She currently denies polyuria or polydipsia.  Hypothyroidism Susan Shields has a diagnosis of hypothyroidism. She is on levothyroxine. She denies hot or cold intolerance or palpitations, but does admit to ongoing fatigue.  Shortness of Breath with Exertion Susan Shields is staring to exercise again and she notes dyspnea with activity. She denies shortness of breath at rest or orthopnea.  ASSESSMENT AND PLAN:  Insulin resistance - Plan: metFORMIN (GLUCOPHAGE) 500 MG tablet  Other specified hypothyroidism  Shortness of breath on exertion  At risk for diabetes mellitus  Class 1 obesity with serious comorbidity and body mass index (BMI) of 34.0 to 34.9 in adult, unspecified obesity  type  PLAN:  Insulin Resistance Susan Shields will continue to work on weight loss, exercise, and decreasing simple carbohydrates in her diet to help decrease the risk of diabetes. We dicussed metformin including benefits and risks. She was informed that eating too many simple carbohydrates or too many calories at one sitting increases the likelihood of GI side effects. Susan Shields agrees to continue taking metformin 500 mg PO q AM #30 and we will refill for 1 month. Susan Shields agrees to follow up with our clinic in 2 weeks as directed to monitor her progress.  Diabetes risk counseling Susan Shields was given extended (15 minutes) diabetes prevention counseling today. She is 38 y.o. female and has risk factors for diabetes including obesity and insulin resistance. We discussed intensive lifestyle modifications today with an emphasis on weight loss as well as increasing exercise and decreasing simple carbohydrates in her diet.  Hypothyroidism Susan Shields was informed of the importance of good thyroid control to help with weight loss efforts. She was also informed that supertheraputic thyroid levels are dangerous and will not improve weight loss results. Susan Shields agrees to continue taking current dose of levothyroxine, and she agrees to follow up with our clinic in 2 weeks.  Shortness of Breath We will recheck indirect calorimeter today. She has agreed to work on weight loss and gradually increase exercise to treat her exercise induced shortness of breath. If Susan Shields follows our instructions and loses weight without improvement of her shortness of breath, we will plan to refer to pulmonology. Susan Shields agrees to this plan.  Obesity Susan Shields is currently in the action stage  of change. As such, her goal is to continue with weight loss efforts She has agreed to keep a food journal with 1400-1500 calories and 100+ grams of protein daily and follow the Category 3 plan  Susan Shields is to follow Category 3 during weekdays and journal during  weekends.  Susan Shields has been instructed to work up to a goal of 150 minutes of combined cardio and strengthening exercise per week or start going to the gym 3-4 days per week for weight loss and overall health benefits. We discussed the following Behavioral Modification Strategies today: increasing lean protein intake, increasing vegetables, work on meal planning and easy cooking plans, better snacking choices, and planning for success   Susan Shields has agreed to follow up with our clinic in 2 weeks. She was informed of the importance of frequent follow up visits to maximize her success with intensive lifestyle modifications for her multiple health conditions.  ALLERGIES: No Known Allergies  MEDICATIONS: Current Outpatient Medications on File Prior to Visit  Medication Sig Dispense Refill  . cholecalciferol (VITAMIN D) 1000 units tablet Take 1,000 Units by mouth daily.    [name] Kitchen levothyroxine (SYNTHROID, LEVOTHROID) 125 MCG tablet Take 125 mcg by mouth daily before breakfast.    . LINZESS 290 MCG CAPS capsule Take 1 capsule (290 mcg total) by mouth daily. 90 capsule 3   No current facility-administered medications on file prior to visit.     PAST MEDICAL HISTORY: Past Medical History:  Diagnosis Date  . Constipation   . Graves disease   . Hyperlipidemia   . IBS (irritable bowel syndrome)   . Thyroid disease   . Vitamin D deficiency     PAST SURGICAL HISTORY: Past Surgical History:  Procedure Laterality Date  . CESAREAN SECTION    . COLONOSCOPY      SOCIAL HISTORY: Social History   Tobacco Use  . Smoking status: Never Smoker  . Smokeless tobacco: Never Used  Substance Use Topics  . Alcohol use: Yes    Comment: occasional  . Drug use: No    FAMILY HISTORY: Family History  Problem Relation Age of Onset  . Diabetes Mother   . Cancer Mother        MMT Cancer  . Obesity Mother   . Throat cancer Father   . Sudden death Father   . Stroke Father     ROS: Review of Systems   Constitutional: Positive for weight loss.  Respiratory: Positive for shortness of breath.   Cardiovascular: Negative for palpitations and orthopnea.  Genitourinary: Negative for frequency.  Endo/Heme/Allergies: Negative for polydipsia.       Negative hot/cold intolerance    PHYSICAL EXAM: Blood pressure 126/83, pulse 71, temperature 97.9 F (36.6 C), temperature source Oral, height  (1.651 m), weight 207 lb (93.9 kg), last menstrual period 01/25/2019, SpO2 98 %. Body mass index is 34.45 kg/m. Physical Exam Vitals signs reviewed.  Constitutional:      Appearance: Normal appearance. She is obese.  Cardiovascular:     Rate and Rhythm: Normal rate.     Pulses: Normal pulses.  Pulmonary:     Effort: Pulmonary effort is normal.     Breath sounds: Normal breath sounds.  Musculoskeletal: Normal range of motion.  Skin:    General: Skin is warm and dry.  Neurological:     Mental Status: She is alert and oriented to person, place, and time.  Psychiatric:        Mood and Affect: Mood normal.  Behavior: Behavior normal.     RECENT LABS AND TESTS: BMET No results found for: NA, K, CL, CO2, GLUCOSE, BUN, CREATININE, CALCIUM, GFRNONAA, GFRAA Lab Results  Component Value Date   HGBA1C 5.4 01/25/2019   Lab Results  Component Value Date   INSULIN 13.0 01/25/2019   INSULIN 9.4 10/28/2018   CBC No results found for: WBC, RBC, HGB, HCT, PLT, MCV, MCH, MCHC, RDW, LYMPHSABS, MONOABS, EOSABS, BASOSABS Iron/TIBC/Ferritin/ %Sat No results found for: IRON, TIBC, FERRITIN, IRONPCTSAT Lipid Panel  No results found for: CHOL, TRIG, HDL, CHOLHDL, VLDL, LDLCALC, LDLDIRECT Hepatic Function Panel  No results found for: PROT, ALBUMIN, AST, ALT, ALKPHOS, BILITOT, BILIDIR, IBILI No results found for: TSH    OBESITY BEHAVIORAL INTERVENTION VISIT  Today's visit was # 9   Starting weight: 208 lbs Starting date: 10/28/18 Today's weight : 207 lbs  Today's date: 02/22/2019 Total lbs  lost to date: 1    02/22/2019  Height 5\' 5"  (1.651 m)  Weight 207 lb (93.9 kg)  BMI (Calculated) 34.45  BLOOD PRESSURE - SYSTOLIC 126  BLOOD PRESSURE - DIASTOLIC 83   Body Fat % 38.1 %  Total Body Water (lbs) 83.6 lbs  RMR 1824     ASK: We discussed the diagnosis of obesity with Susan Shields today and Susan Shields agreed to give [place] permission to discuss obesity behavioral modification therapy today.  ASSESS: Susan Shields has the diagnosis of obesity and her BMI today is 34.45 Wilna is in the action stage of change   ADVISE: Susan Shields was educated on the multiple health risks of obesity as well as the benefit of weight loss to improve her health. She was advised of the need for long term treatment and the importance of lifestyle modifications to improve her current health and to decrease her risk of future health problems.  AGREE: Multiple dietary modification options and treatment options were discussed and  Adrian agreed to follow the recommendations documented in the above note.  ARRANGE: Jilleen was educated on the importance of frequent visits to treat obesity as outlined per CMS and USPSTF guidelines and agreed to schedule her next follow up appointment today.  I, Burt Knack, am acting as transcriptionist for Debbra Riding, MD  I have reviewed the above documentation for accuracy and completeness, and I agree with the above. - Debbra Riding, MD

## 2019-03-03 MED FILL — metFORMIN HCL 500 MG TABS: 500 | 30 days supply | Qty: 30 | Fill #0

## 2019-03-14 ENCOUNTER — Other Ambulatory Visit: Payer: Self-pay

## 2019-03-14 ENCOUNTER — Encounter (INDEPENDENT_AMBULATORY_CARE_PROVIDER_SITE_OTHER): Payer: Self-pay | Admitting: Family Medicine

## 2019-03-14 ENCOUNTER — Ambulatory Visit (INDEPENDENT_AMBULATORY_CARE_PROVIDER_SITE_OTHER): Payer: 59 | Admitting: Family Medicine

## 2019-03-14 VITALS — BP 142/81 | HR 89 | Temp 98.4°F | Ht 65.0 in | Wt 207.0 lb

## 2019-03-14 DIAGNOSIS — E669 Obesity, unspecified: Secondary | ICD-10-CM

## 2019-03-14 DIAGNOSIS — E559 Vitamin D deficiency, unspecified: Secondary | ICD-10-CM | POA: Diagnosis not present

## 2019-03-14 DIAGNOSIS — Z6834 Body mass index (BMI) 34.0-34.9, adult: Secondary | ICD-10-CM | POA: Diagnosis not present

## 2019-03-14 DIAGNOSIS — E8881 Metabolic syndrome: Secondary | ICD-10-CM

## 2019-03-14 NOTE — Progress Notes (Signed)
Office: [phone]  /  Fax: [phone]   HPI:   Chief Complaint: OBESITY Susan Shields is here to discuss her progress with her obesity treatment plan. She is on the keep a food journal with 1400-1500 calories and 100+ grams of protein daily or follow the Category 3 plan and is following her eating plan approximately 95 % of the time. She states she is walking for 30-45 minutes 3-4 times per week. Susan Shields had a large indulgent breakfast today for being employee of the year. She has been journaling everyday not just on the weekends. She denies hunger and no obstacles foreseen in the next 2 weeks.  Her weight is 207 lb (93.9 kg) today and has not lost weight since her last visit. She has lost 1 lb since starting treatment with [place].  Vitamin D Deficiency Susan Shields has a diagnosis of vitamin D deficiency. She is currently taking OTC Vit D. She notes fatigue and denies nausea, vomiting or muscle weakness.  Insulin Resistance Susan Shields has a diagnosis of insulin resistance based on her elevated fasting insulin level >5. Although Susan Shields's blood glucose readings are still under good control, insulin resistance puts her at greater risk of metabolic syndrome and diabetes. She notes carbohydrate cravings and denies GI side effects of metformin. She continues to work on diet and exercise to decrease risk of diabetes.  ASSESSMENT AND PLAN:  Vitamin D deficiency  Insulin resistance  Class 1 obesity with serious comorbidity and body mass index (BMI) of 34.0 to 34.9 in adult, unspecified obesity type  PLAN:  Vitamin D Deficiency [name] was informed that low vitamin D levels contributes to fatigue and are associated with obesity, breast, and colon cancer. [name] agrees to continue taking OTC Vit D and will follow up for routine testing of vitamin D, at least 2-3 times per year. She was informed of the risk of over-replacement of vitamin D and agrees to not increase her dose unless she discusses this with [place]  first. [name] agrees to follow up with our clinic in 2 weeks.  Insulin Resistance Susan Shields will continue to work on weight loss, exercise, and decreasing simple carbohydrates in her diet to help decrease the risk of diabetes. We dicussed metformin including benefits and risks. She was informed that eating too many simple carbohydrates or too many calories at one sitting increases the likelihood of GI side effects. Susan Shields agrees to continue taking metformin, no refill needed, and she agrees to follow up with our clinic in 2 weeks as directed to monitor her progress.  I spent > than 50% of the 15 minute visit on counseling as documented in the note.  Obesity Susan Shields is currently in the action stage of change. As such, her goal is to continue with weight loss efforts She has agreed to keep a food journal with 1400-1500 calories and 100+ grams of protein daily Susan Shields has been instructed to work up to a goal of 150 minutes of combined cardio and strengthening exercise per week for weight loss and overall health benefits. We discussed the following Behavioral Modification Strategies today: increasing lean protein intake, increasing vegetables, work on meal planning and easy cooking plans, better snacking choices, and planning for success   Susan Shields has agreed to follow up with our clinic in 2 weeks. She was informed of the importance of frequent follow up visits to maximize her success with intensive lifestyle modifications for her multiple health conditions.  ALLERGIES: No Known Allergies  MEDICATIONS: Current Outpatient Medications on File Prior to Visit  Medication Sig Dispense Refill  . cholecalciferol (VITAMIN D) 1000 units tablet Take 1,000 Units by mouth daily.    [name] Kitchen levothyroxine (SYNTHROID, LEVOTHROID) 125 MCG tablet Take 125 mcg by mouth daily before breakfast.    . LINZESS 290 MCG CAPS capsule Take 1 capsule (290 mcg total) by mouth daily. 90 capsule 3  . metFORMIN (GLUCOPHAGE) 500 MG tablet  Take 1 tablet (500 mg total) by mouth daily with breakfast. 30 tablet 0   No current facility-administered medications on file prior to visit.     PAST MEDICAL HISTORY: Past Medical History:  Diagnosis Date  . Constipation   . Graves disease   . Hyperlipidemia   . IBS (irritable bowel syndrome)   . Thyroid disease   . Vitamin D deficiency     PAST SURGICAL HISTORY: Past Surgical History:  Procedure Laterality Date  . CESAREAN SECTION    . COLONOSCOPY      SOCIAL HISTORY: Social History   Tobacco Use  . Smoking status: Never Smoker  . Smokeless tobacco: Never Used  Substance Use Topics  . Alcohol use: Yes    Comment: occasional  . Drug use: No    FAMILY HISTORY: Family History  Problem Relation Age of Onset  . Diabetes Mother   . Cancer Mother        MMT Cancer  . Obesity Mother   . Throat cancer Father   . Sudden death Father   . Stroke Father     ROS: Review of Systems  Constitutional: Positive for malaise/fatigue. Negative for weight loss.  Gastrointestinal: Negative for nausea and vomiting.  Musculoskeletal:       Negative muscle weakness    PHYSICAL EXAM: Blood pressure (!) 142/81, pulse 89, temperature 98.4 F (36.9 C), temperature source Oral, height 5\' 5"  (1.651 m), weight 207 lb (93.9 kg), SpO2 97 %. Body mass index is 34.45 kg/m. Physical Exam Vitals signs reviewed.  Constitutional:      Appearance: Normal appearance. She is obese.  Cardiovascular:     Rate and Rhythm: Normal rate.     Pulses: Normal pulses.  Pulmonary:     Effort: Pulmonary effort is normal.     Breath sounds: Normal breath sounds.  Musculoskeletal: Normal range of motion.  Skin:    General: Skin is warm and dry.  Neurological:     Mental Status: She is alert and oriented to person, place, and time.  Psychiatric:        Mood and Affect: Mood normal.        Behavior: Behavior normal.     RECENT LABS AND TESTS: BMET No results found for: NA, K, CL, CO2,  GLUCOSE, BUN, CREATININE, CALCIUM, GFRNONAA, GFRAA Lab Results  Component Value Date   HGBA1C 5.4 01/25/2019   Lab Results  Component Value Date   INSULIN 13.0 01/25/2019   INSULIN 9.4 10/28/2018   CBC No results found for: WBC, RBC, HGB, HCT, PLT, MCV, MCH, MCHC, RDW, LYMPHSABS, MONOABS, EOSABS, BASOSABS Iron/TIBC/Ferritin/ %Sat No results found for: IRON, TIBC, FERRITIN, IRONPCTSAT Lipid Panel  No results found for: CHOL, TRIG, HDL, CHOLHDL, VLDL, LDLCALC, LDLDIRECT Hepatic Function Panel  No results found for: PROT, ALBUMIN, AST, ALT, ALKPHOS, BILITOT, BILIDIR, IBILI No results found for: TSH    OBESITY BEHAVIORAL INTERVENTION VISIT  Today's visit was # 10   Starting weight: 208 lbs Starting date: 10/28/18 Today's weight :207 lbs Today's date: 03/14/2019 Total lbs lost to date: 1    03/14/2019  Height 5'  5" (1.651 m)  Weight 207 lb (93.9 kg)  BMI (Calculated) 34.45  BLOOD PRESSURE - SYSTOLIC 142  BLOOD PRESSURE - DIASTOLIC 81   Body Fat % 36.5 %  Total Body Water (lbs) 83.4 lbs     ASK: We discussed the diagnosis of obesity with Teddi R Schiefelbein today and Tedra agreed to give [place] permission to discuss obesity behavioral modification therapy today.  ASSESS: Aqueelah has the diagnosis of obesity and her BMI today is 34.45 [place] is in the action stage of change   ADVISE: Nalla was educated on the multiple health risks of obesity as well as the benefit of weight loss to improve her health. She was advised of the need for long term treatment and the importance of lifestyle modifications to improve her current health and to decrease her risk of future health problems.  AGREE: Multiple dietary modification options and treatment options were discussed and  Aelyn agreed to follow the recommendations documented in the above note.  ARRANGE: Tenley was educated on the importance of frequent visits to treat obesity as outlined per CMS and USPSTF guidelines and agreed to  schedule her next follow up appointment today.  I, Burt Knack, am acting as transcriptionist for Debbra Riding, MD  I have reviewed the above documentation for accuracy and completeness, and I agree with the above. - Debbra Riding, MD

## 2019-03-21 ENCOUNTER — Other Ambulatory Visit: Payer: Self-pay | Admitting: Gastroenterology

## 2019-03-21 MED FILL — LINZESS 290 MCG CAPSULE: 290 | 90 days supply | Qty: 90 | Fill #0

## 2019-03-21 MED FILL — LEVOTHYROXINE 125 MCG TABLE: 125 | 90 days supply | Qty: 90 | Fill #0

## 2019-03-22 ENCOUNTER — Encounter (INDEPENDENT_AMBULATORY_CARE_PROVIDER_SITE_OTHER): Payer: Self-pay

## 2019-03-22 ENCOUNTER — Encounter (INDEPENDENT_AMBULATORY_CARE_PROVIDER_SITE_OTHER): Payer: Self-pay | Admitting: Family Medicine

## 2019-03-30 ENCOUNTER — Other Ambulatory Visit: Payer: Self-pay

## 2019-03-30 ENCOUNTER — Encounter (INDEPENDENT_AMBULATORY_CARE_PROVIDER_SITE_OTHER): Payer: Self-pay | Admitting: Family Medicine

## 2019-03-30 ENCOUNTER — Ambulatory Visit (INDEPENDENT_AMBULATORY_CARE_PROVIDER_SITE_OTHER): Payer: 59 | Admitting: Family Medicine

## 2019-03-30 DIAGNOSIS — Z6834 Body mass index (BMI) 34.0-34.9, adult: Secondary | ICD-10-CM | POA: Diagnosis not present

## 2019-03-30 DIAGNOSIS — E8881 Metabolic syndrome: Secondary | ICD-10-CM | POA: Diagnosis not present

## 2019-03-30 DIAGNOSIS — E669 Obesity, unspecified: Secondary | ICD-10-CM

## 2019-03-30 DIAGNOSIS — E038 Other specified hypothyroidism: Secondary | ICD-10-CM

## 2019-03-30 DIAGNOSIS — E88819 Insulin resistance, unspecified: Secondary | ICD-10-CM

## 2019-03-30 MED ORDER — METFORMIN HCL 500 MG PO TABS: 500.0000 mg | ORAL_TABLET | Freq: Two times a day (BID) | ORAL | 0 refills | Status: DC

## 2019-03-30 MED FILL — metFORMIN HCL 500 MG TABS: 500 | 30 days supply | Qty: 60 | Fill #0

## 2019-03-30 NOTE — Telephone Encounter (Signed)
Spoke with pt. Jeralene Peters, LPN

## 2019-03-31 NOTE — Progress Notes (Signed)
Office: [phone]  /  Fax: [phone] TeleHealth Visit:  Susan Shields has verbally consented to this TeleHealth visit today. The patient is located at home, the provider is located at the [facility] office. The participants in this visit include the listed provider and patient and provider's assistant. The visit was conducted today via video on Webex.  HPI:   Chief Complaint: OBESITY Susan Shields is here to discuss her progress with her obesity treatment plan. She is on the keep a food journal with 1400-1500 calories and 100+ grams of protein daily and is following her eating plan approximately 95 % of the time. She states she is doing [employer] and walking for 20-30 minutes 4-5 times per week. 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 She has been more able to stick to the meal plan with increase in metformin. She is starting to get nervous about availability of fresh vegetables. She is not experiencing any stress, boredom, or emotional eating.  We were unable to weigh the patient today for this TeleHealth visit. She feels as if she has lost 1 lbs since her last visit. She has lost 1-2 lbs since starting treatment with [place].  Insulin Resistance Susan Shields has a diagnosis of insulin resistance based on her elevated fasting insulin level >5. Although Susan Shields's blood glucose readings are still under good control, insulin resistance puts her at greater risk of metabolic syndrome and diabetes. She has increased metformin to BID and is feeling better. She denies GI side effects of metformin and continues to work on diet and exercise to decrease risk of diabetes.  Hypothyroidism Susan Shields has a diagnosis of hypothyroidism. She is stable on Synthroid. She denies hot or cold intolerance or palpitations, but does admit to ongoing fatigue.  ASSESSMENT AND PLAN:  Insulin resistance - Plan: metFORMIN (GLUCOPHAGE) 500 MG tablet  Other specified hypothyroidism  Class  1 obesity with serious comorbidity and body mass index (BMI) of 34.0 to 34.9 in adult, unspecified obesity type  PLAN:  Insulin Resistance Susan Shields will continue to work on weight loss, exercise, and decreasing simple carbohydrates in her diet to help decrease the risk of diabetes. We dicussed metformin including benefits and risks. She was informed that eating too many simple carbohydrates or too many calories at one sitting increases the likelihood of GI side effects. Susan Shields agrees to continue taking metformin 500 mg PO BID #60 and we will refill for 1 month. Susan Shields agrees to follow up with our clinic in 2 weeks as directed to monitor her progress.  Hypothyroidism Susan Shields was informed of the importance of good thyroid control to help with weight loss efforts. She was also informed that supertheraputic thyroid levels are dangerous and will not improve weight loss results. Susan Shields agrees to continue taking Synthroid and she agrees to follow up with our clinic in 2 weeks.   Obesity Susan Shields is currently in the action stage of change. As such, her goal is to continue with weight loss efforts She has agreed to follow the Category 3 plan Susan Shields has been instructed to work up to a goal of 150 minutes of combined cardio and strengthening exercise per week or continue [facility] videos for weight loss and overall health benefits. We discussed the following Behavioral Modification Strategies today: increasing lean protein intake, increasing vegetables and work on meal planning and easy cooking plans, better snacking choices, and planning for success   Susan Shields has agreed to follow up with our clinic  in 2 weeks. She was informed of the importance of frequent follow up visits to maximize her success with intensive lifestyle modifications for her multiple health conditions.  ALLERGIES: No Known Allergies  MEDICATIONS: Current Outpatient Medications on File Prior to Visit  Medication Sig Dispense Refill  .  cholecalciferol (VITAMIN D) 1000 units tablet Take 1,000 Units by mouth daily.    [name] Kitchen levothyroxine (SYNTHROID, LEVOTHROID) 125 MCG tablet Take 125 mcg by mouth daily before breakfast.    . LINZESS 290 MCG CAPS capsule TAKE 1 CAPSULE BY MOUTH ONCE DAILY 90 capsule 0   No current facility-administered medications on file prior to visit.     PAST MEDICAL HISTORY: Past Medical History:  Diagnosis Date  . Constipation   . Graves disease   . Hyperlipidemia   . IBS (irritable bowel syndrome)   . Thyroid disease   . Vitamin D deficiency     PAST SURGICAL HISTORY: Past Surgical History:  Procedure Laterality Date  . CESAREAN SECTION    . COLONOSCOPY      SOCIAL HISTORY: Social History   Tobacco Use  . Smoking status: Never Smoker  . Smokeless tobacco: Never Used  Substance Use Topics  . Alcohol use: Yes    Comment: occasional  . Drug use: No    FAMILY HISTORY: Family History  Problem Relation Age of Onset  . Diabetes Mother   . Cancer Mother        MMT Cancer  . Obesity Mother   . Throat cancer Father   . Sudden death Father   . Stroke Father     ROS: Review of Systems  Constitutional: Positive for malaise/fatigue and weight loss.  Cardiovascular: Negative for palpitations.  Endo/Heme/Allergies:       Negative hot/cold intolerance    PHYSICAL EXAM: Pt in no acute distress  RECENT LABS AND TESTS: BMET No results found for: NA, K, CL, CO2, GLUCOSE, BUN, CREATININE, CALCIUM, GFRNONAA, GFRAA Lab Results  Component Value Date   HGBA1C 5.4 01/25/2019   Lab Results  Component Value Date   INSULIN 13.0 01/25/2019   INSULIN 9.4 10/28/2018   CBC No results found for: WBC, RBC, HGB, HCT, PLT, MCV, MCH, MCHC, RDW, LYMPHSABS, MONOABS, EOSABS, BASOSABS Iron/TIBC/Ferritin/ %Sat No results found for: IRON, TIBC, FERRITIN, IRONPCTSAT Lipid Panel  No results found for: CHOL, TRIG, HDL, CHOLHDL, VLDL, LDLCALC, LDLDIRECT Hepatic Function Panel  No results found for:  PROT, ALBUMIN, AST, ALT, ALKPHOS, BILITOT, BILIDIR, IBILI No results found for: TSH    I, Burt Knack, am acting as transcriptionist for Debbra Riding, MD  I have reviewed the above documentation for accuracy and completeness, and I agree with the above. - Debbra Riding, MD

## 2019-04-12 ENCOUNTER — Encounter (INDEPENDENT_AMBULATORY_CARE_PROVIDER_SITE_OTHER): Payer: Self-pay | Admitting: Family Medicine

## 2019-04-12 ENCOUNTER — Other Ambulatory Visit: Payer: Self-pay

## 2019-04-12 ENCOUNTER — Ambulatory Visit (INDEPENDENT_AMBULATORY_CARE_PROVIDER_SITE_OTHER): Payer: 59 | Admitting: Family Medicine

## 2019-04-12 DIAGNOSIS — E038 Other specified hypothyroidism: Secondary | ICD-10-CM

## 2019-04-12 DIAGNOSIS — E669 Obesity, unspecified: Secondary | ICD-10-CM | POA: Diagnosis not present

## 2019-04-12 DIAGNOSIS — E8881 Metabolic syndrome: Secondary | ICD-10-CM

## 2019-04-12 DIAGNOSIS — Z6834 Body mass index (BMI) 34.0-34.9, adult: Secondary | ICD-10-CM

## 2019-04-18 NOTE — Progress Notes (Signed)
Office: [phone]  /  Fax: [phone] TeleHealth Visit:  Susan Shields has verbally consented to this TeleHealth visit today. The patient is located at home, the provider is located at the [facility] office. The participants in this visit include the listed provider and patient. The visit was conducted today via webex.  HPI:   Chief Complaint: OBESITY Susan Shields is here to discuss her progress with her obesity treatment plan. She is on the Category 3 plan and is following her eating plan approximately 95 % of the time. She states she is walking around the track, on the elliptical, and Youtube videos. Susan Shields is still going into the office good portion of the time. She is not doing boredom or stress snacking. She is more consistently getting all food in when at work. She is still journaling and now has all the food for the plan. She states she has not weighed herself but she is down to a size 12. We were unable to weigh the patient today for this TeleHealth visit. She feels as if she has lost weight since her last visit. She has lost 1 lb since starting treatment with us.  Insulin Resistance Susan Shields has a diagnosis of insulin resistance based on her elevated fasting insulin level >5. Although Susan Shields's blood glucose readings are still under good control, insulin resistance puts her at greater risk of metabolic syndrome and diabetes. She is still taking metformin BID and denies side effects of metformin. She continues to work on diet and exercise to decrease risk of diabetes.  Hypothyroidism Susan Shields has a diagnosis of hypothyroidism. She is on Synthroid. She denies hot or cold intolerance or palpitations, but does admit to ongoing fatigue.  ASSESSMENT AND PLAN:  Insulin resistance  Other specified hypothyroidism  Class 1 obesity with serious comorbidity and body mass index (BMI) of 34.0 to 34.9 in adult, unspecified obesity type  PLAN:  Insulin Resistance Susan Shields will  continue to work on weight loss, exercise, and decreasing simple carbohydrates in her diet to help decrease the risk of diabetes. We dicussed metformin including benefits and risks. She was informed that eating too many simple carbohydrates or too many calories at one sitting increases the likelihood of GI side effects. Susan Shields agrees to continue taking metformin at the same dose. Susan Shields agrees to follow up with our clinic in 2 weeks as directed to monitor her progress.  Hypothyroidism Susan Shields was informed of the importance of good thyroid control to help with weight loss efforts. She was also informed that supertheraputic thyroid levels are dangerous and will not improve weight loss results. Susan Shields agrees to continue taking Synthroid at the same dose. Susan Shields agrees to follow up with our clinic in 2 weeks.  Obesity Susan Shields is currently in the action stage of change. As such, her goal is to continue with weight loss efforts She has agreed to follow the Category 3 plan Susan Shields has been instructed to work up to a goal of 150 minutes of combined cardio and strengthening exercise per week or continue physical activity as previously scheduled Monday thru Friday for weight loss and overall health benefits. We discussed the following Behavioral Modification Strategies today: increasing lean protein intake, increasing vegetables, work on meal planning and easy cooking plans, emotional eating strategies, and planning for success   Susan Shields has agreed to follow up with our clinic in 2 weeks. She was informed of the importance of frequent follow up visits to maximize her success with intensive lifestyle modifications for her  multiple health conditions.  ALLERGIES: No Known Allergies  MEDICATIONS: Current Outpatient Medications on File Prior to Visit  Medication Sig Dispense Refill  . cholecalciferol (VITAMIN D) 1000 units tablet Take 1,000 Units by mouth daily.    [name] Kitchen levothyroxine (SYNTHROID, LEVOTHROID) 125 MCG  tablet Take 125 mcg by mouth daily before breakfast.    . LINZESS 290 MCG CAPS capsule TAKE 1 CAPSULE BY MOUTH ONCE DAILY 90 capsule 0  . metFORMIN (GLUCOPHAGE) 500 MG tablet Take 1 tablet (500 mg total) by mouth 2 (two) times daily with a meal for 60 doses. 60 tablet 0   No current facility-administered medications on file prior to visit.     PAST MEDICAL HISTORY: Past Medical History:  Diagnosis Date  . Constipation   . Graves disease   . Hyperlipidemia   . IBS (irritable bowel syndrome)   . Thyroid disease   . Vitamin D deficiency     PAST SURGICAL HISTORY: Past Surgical History:  Procedure Laterality Date  . CESAREAN SECTION    . COLONOSCOPY      SOCIAL HISTORY: Social History   Tobacco Use  . Smoking status: Never Smoker  . Smokeless tobacco: Never Used  Substance Use Topics  . Alcohol use: Yes    Comment: occasional  . Drug use: No    FAMILY HISTORY: Family History  Problem Relation Age of Onset  . Diabetes Mother   . Cancer Mother        MMT Cancer  . Obesity Mother   . Throat cancer Father   . Sudden death Father   . Stroke Father     ROS: Review of Systems  Constitutional: Positive for malaise/fatigue and weight loss.  Cardiovascular: Negative for palpitations.  Endo/Heme/Allergies:       Negative hot/cold intolerance    PHYSICAL EXAM: Pt in no acute distress  RECENT LABS AND TESTS: BMET No results found for: NA, K, CL, CO2, GLUCOSE, BUN, CREATININE, CALCIUM, GFRNONAA, GFRAA Lab Results  Component Value Date   HGBA1C 5.4 01/25/2019   Lab Results  Component Value Date   INSULIN 13.0 01/25/2019   INSULIN 9.4 10/28/2018   CBC No results found for: WBC, RBC, HGB, HCT, PLT, MCV, MCH, MCHC, RDW, LYMPHSABS, MONOABS, EOSABS, BASOSABS Iron/TIBC/Ferritin/ %Sat No results found for: IRON, TIBC, FERRITIN, IRONPCTSAT Lipid Panel  No results found for: CHOL, TRIG, HDL, CHOLHDL, VLDL, LDLCALC, LDLDIRECT Hepatic Function Panel  No results found  for: PROT, ALBUMIN, AST, ALT, ALKPHOS, BILITOT, BILIDIR, IBILI No results found for: TSH    I, Burt Knack, am acting as transcriptionist for Debbra Riding, MD  I have reviewed the above documentation for accuracy and completeness, and I agree with the above. - Debbra Riding, MD

## 2019-04-26 ENCOUNTER — Encounter (INDEPENDENT_AMBULATORY_CARE_PROVIDER_SITE_OTHER): Payer: Self-pay | Admitting: Family Medicine

## 2019-04-26 ENCOUNTER — Ambulatory Visit (INDEPENDENT_AMBULATORY_CARE_PROVIDER_SITE_OTHER): Payer: 59 | Admitting: Family Medicine

## 2019-04-26 ENCOUNTER — Other Ambulatory Visit: Payer: Self-pay

## 2019-04-26 DIAGNOSIS — E559 Vitamin D deficiency, unspecified: Secondary | ICD-10-CM

## 2019-04-26 DIAGNOSIS — E8881 Metabolic syndrome: Secondary | ICD-10-CM

## 2019-04-26 DIAGNOSIS — E669 Obesity, unspecified: Secondary | ICD-10-CM | POA: Diagnosis not present

## 2019-04-26 DIAGNOSIS — E88819 Insulin resistance, unspecified: Secondary | ICD-10-CM

## 2019-04-26 DIAGNOSIS — Z6834 Body mass index (BMI) 34.0-34.9, adult: Secondary | ICD-10-CM | POA: Diagnosis not present

## 2019-04-27 NOTE — Progress Notes (Signed)
Office: [phone]  /  Fax: [phone] TeleHealth Visit:  Susan Shields has verbally consented to this TeleHealth visit today. The patient is located at home, the provider is located at the [facility] office. The participants in this visit include the listed provider and patient. [name] was unable to use realtime audiovisual technology today and the telehealth visit was conducted via telephone.   HPI:   Chief Complaint: OBESITY Susan Shields is here to discuss her progress with her obesity treatment plan. She is on the Category 3 plan and is following her eating plan approximately 95 % of the time. She states she is walking, doing exercise videos, and resistance for 45 minutes 4 times per week. Nick voices the last few days she has been craving sweets secondary to being on her menstrual cycle. She is constantly feeling like she needed a taste. She is still feeling like she's losing inches. She is doing better finding vegetables and fruit. She is not weighing herself at home.  We were unable to weigh the patient today for this TeleHealth visit. She feels as if she has maintained her weight since her last visit. She has lost 1 lb since starting treatment with [place].  Vitamin D Deficiency Susan Shields has a diagnosis of vitamin D deficiency. She is currently taking OTC Vit D. She notes fatigue and denies nausea, vomiting or muscle weakness.  Insulin Resistance Susan Shields has a diagnosis of insulin resistance based on her elevated fasting insulin level >5. Although Susan Shields's blood glucose readings are still under good control, insulin resistance puts her at greater risk of metabolic syndrome and diabetes. She denies GI side effects of metformin and notes carbohydrate cravings especially around menses. She continues to work on diet and exercise to decrease risk of diabetes.  ASSESSMENT AND PLAN:  Vitamin D deficiency  Insulin resistance  Class 1 obesity with serious comorbidity and body mass  index (BMI) of 34.0 to 34.9 in adult, unspecified obesity type  PLAN:  Vitamin D Deficiency [name] was informed that low vitamin D levels contributes to fatigue and are associated with obesity, breast, and colon cancer. [name] agrees to continue taking OTC Vit D supplement and will follow up for routine testing of vitamin D, at least 2-3 times per year. She was informed of the risk of over-replacement of vitamin D and agrees to not increase her dose unless she discusses this with [place] first. [name] agrees to follow up with our clinic in 2 weeks.  Insulin Resistance Susan Shields will continue to work on weight loss, exercise, and decreasing simple carbohydrates in her diet to help decrease the risk of diabetes. We dicussed metformin including benefits and risks. She was informed that eating too many simple carbohydrates or too many calories at one sitting increases the likelihood of GI side effects. Susan Shields agrees to continue taking metformin and we will repeat labs at next appointment. [name] agrees to follow up with our clinic in 2 weeks as directed to monitor her progress.  Obesity Susan Shields is currently in the action stage of change. As such, her goal is to continue with weight loss efforts She has agreed to follow the Category 3 plan [name] has been instructed to work up to a goal of 150 minutes of combined cardio and strengthening exercise per week or continue physical activity for weight loss and overall health benefits. We discussed the following Behavioral Modification Strategies today: increasing lean protein intake, increasing vegetables and work on meal planning and easy cooking plans, better snacking  choices, and planning for success   Susan Shields has agreed to follow up with our clinic in 2 weeks. She was informed of the importance of frequent follow up visits to maximize her success with intensive lifestyle modifications for her multiple health conditions.  ALLERGIES: No Known Allergies   MEDICATIONS: Current Outpatient Medications on File Prior to Visit  Medication Sig Dispense Refill  . cholecalciferol (VITAMIN D) 1000 units tablet Take 1,000 Units by mouth daily.    [name] Kitchen. levothyroxine (SYNTHROID, LEVOTHROID) 125 MCG tablet Take 125 mcg by mouth daily before breakfast.    . LINZESS 290 MCG CAPS capsule TAKE 1 CAPSULE BY MOUTH ONCE DAILY 90 capsule 0  . metFORMIN (GLUCOPHAGE) 500 MG tablet Take 1 tablet (500 mg total) by mouth 2 (two) times daily with a meal for 60 doses. 60 tablet 0   No current facility-administered medications on file prior to visit.     PAST MEDICAL HISTORY: Past Medical History:  Diagnosis Date  . Constipation   . Graves disease   . Hyperlipidemia   . IBS (irritable bowel syndrome)   . Thyroid disease   . Vitamin D deficiency     PAST SURGICAL HISTORY: Past Surgical History:  Procedure Laterality Date  . CESAREAN SECTION    . COLONOSCOPY      SOCIAL HISTORY: Social History   Tobacco Use  . Smoking status: Never Smoker  . Smokeless tobacco: Never Used  Substance Use Topics  . Alcohol use: Yes    Comment: occasional  . Drug use: No    FAMILY HISTORY: Family History  Problem Relation Age of Onset  . Diabetes Mother   . Cancer Mother        MMT Cancer  . Obesity Mother   . Throat cancer Father   . Sudden death Father   . Stroke Father     ROS: Review of Systems  Constitutional: Positive for malaise/fatigue. Negative for weight loss.  Gastrointestinal: Negative for nausea and vomiting.  Musculoskeletal:       Negative muscle weakness    PHYSICAL EXAM: Pt in no acute distress  RECENT LABS AND TESTS: BMET No results found for: NA, K, CL, CO2, GLUCOSE, BUN, CREATININE, CALCIUM, GFRNONAA, GFRAA Lab Results  Component Value Date   HGBA1C 5.4 01/25/2019   Lab Results  Component Value Date   INSULIN 13.0 01/25/2019   INSULIN 9.4 10/28/2018   CBC No results found for: WBC, RBC, HGB, HCT, PLT, MCV, MCH, MCHC, RDW,  LYMPHSABS, MONOABS, EOSABS, BASOSABS Iron/TIBC/Ferritin/ %Sat No results found for: IRON, TIBC, FERRITIN, IRONPCTSAT Lipid Panel  No results found for: CHOL, TRIG, HDL, CHOLHDL, VLDL, LDLCALC, LDLDIRECT Hepatic Function Panel  No results found for: PROT, ALBUMIN, AST, ALT, ALKPHOS, BILITOT, BILIDIR, IBILI No results found for: TSH    I, Burt KnackSharon Martin, am acting as transcriptionist for Debbra RidingAlexandria Kadolph, MD  I have reviewed the above documentation for accuracy and completeness, and I agree with the above. - Debbra RidingAlexandria Kadolph, MD

## 2019-05-10 ENCOUNTER — Encounter (INDEPENDENT_AMBULATORY_CARE_PROVIDER_SITE_OTHER): Payer: Self-pay | Admitting: Family Medicine

## 2019-05-10 ENCOUNTER — Other Ambulatory Visit: Payer: Self-pay

## 2019-05-10 ENCOUNTER — Ambulatory Visit (INDEPENDENT_AMBULATORY_CARE_PROVIDER_SITE_OTHER): Payer: 59 | Admitting: Family Medicine

## 2019-05-10 DIAGNOSIS — Z6834 Body mass index (BMI) 34.0-34.9, adult: Secondary | ICD-10-CM | POA: Diagnosis not present

## 2019-05-10 DIAGNOSIS — E669 Obesity, unspecified: Secondary | ICD-10-CM | POA: Diagnosis not present

## 2019-05-10 DIAGNOSIS — E038 Other specified hypothyroidism: Secondary | ICD-10-CM | POA: Diagnosis not present

## 2019-05-10 DIAGNOSIS — E88819 Insulin resistance, unspecified: Secondary | ICD-10-CM

## 2019-05-10 DIAGNOSIS — E8881 Metabolic syndrome: Secondary | ICD-10-CM

## 2019-05-10 MED ORDER — METFORMIN HCL 500 MG PO TABS: 500.0000 mg | ORAL_TABLET | Freq: Two times a day (BID) | ORAL | 0 refills | Status: DC

## 2019-05-10 MED FILL — metFORMIN HCL 500 MG TABS: 500 | 30 days supply | Qty: 60 | Fill #0

## 2019-05-10 NOTE — Progress Notes (Signed)
Office: [phone]  /  Fax: [phone] TeleHealth Visit:  Susan Shields has verbally consented to this TeleHealth visit today. The patient is located at home, the provider is located at the [facility] office. The participants in this visit include the listed provider and patient. The visit was conducted today via webex.  HPI:   Chief Complaint: OBESITY Susan Shields is here to discuss her progress with her obesity treatment plan. She is on the Category 3 plan and is following her eating plan approximately 95 % of the time. She states she is walking, doing workout videos, and resistance training for 60 minutes 3-4 times per week. Susan Shields weighed in at 204 lbs, change of weight since her last appointment. She denies hunger. She is taking sweet kick for energy boost and sweet cravings. She is trying hard to follow the meal plan.  We were unable to weigh the patient today for this TeleHealth visit. She feels as if she has maintained her weight since her last visit. She has lost 1 lb since starting treatment with [place].  Insulin Resistance Susan Shields has a diagnosis of insulin resistance based on her elevated fasting insulin level >5. Although Susan Shields's blood glucose readings are still under good control, insulin resistance puts her at greater risk of metabolic syndrome and diabetes. She is trying to curb sweet cravings with supplements. She is taking metformin currently and continues to work on diet and exercise to decrease risk of diabetes.  Hypothyroidism Susan Shields has a diagnosis of hypothyroidism. She is currently on levothyroxine. She denies hot or cold intolerance or palpitations, but does admit to ongoing fatigue.  ASSESSMENT AND PLAN:  Insulin resistance - Plan: metFORMIN (GLUCOPHAGE) 500 MG tablet  Other specified hypothyroidism  Class 1 obesity with serious comorbidity and body mass index (BMI) of 34.0 to 34.9 in adult, unspecified obesity type  PLAN:  Insulin Resistance Susan Shields  will continue to work on weight loss, exercise, and decreasing simple carbohydrates in her diet to help decrease the risk of diabetes. We dicussed metformin including benefits and risks. She was informed that eating too many simple carbohydrates or too many calories at one sitting increases the likelihood of GI side effects. Susan Shields agrees to continue taking metformin 500 mg PO BID #60 and we will refill for 1 month. Susan Shields agrees to follow up with our clinic in 2 weeks as directed to monitor her progress.  Hypothyroidism Susan Shields was informed of the importance of good thyroid control to help with weight loss efforts. She was also informed that supertheraputic thyroid levels are dangerous and will not improve weight loss results. We will repeat labs at her first in office appointment. Susan Shields agrees to follow up with our clinic in 2 weeks.  Obesity Susan Shields is currently in the action stage of change. As such, her goal is to continue with weight loss efforts She has agreed to follow the Category 3 plan Susan Shields has been instructed to work up to a goal of 150 minutes of combined cardio and strengthening exercise per week or continue physical activity for weight loss and overall health benefits. We discussed the following Behavioral Modification Strategies today: increasing lean protein intake, increasing vegetables and work on meal planning and easy cooking plans, keeping healthy foods in the home, better snacking choices, and planning for success   Susan Shields has agreed to follow up with our clinic in 2 weeks. She was informed of the importance of frequent follow up visits to maximize her success with intensive lifestyle modifications  for her multiple health conditions.  ALLERGIES: No Known Allergies  MEDICATIONS: Current Outpatient Medications on File Prior to Visit  Medication Sig Dispense Refill  . cholecalciferol (VITAMIN D) 1000 units tablet Take 1,000 Units by mouth daily.    [name] Kitchen. levothyroxine  (SYNTHROID, LEVOTHROID) 125 MCG tablet Take 125 mcg by mouth daily before breakfast.    . LINZESS 290 MCG CAPS capsule TAKE 1 CAPSULE BY MOUTH ONCE DAILY 90 capsule 0  . metFORMIN (GLUCOPHAGE) 500 MG tablet Take 1 tablet (500 mg total) by mouth 2 (two) times daily with a meal for 60 doses. 60 tablet 0   No current facility-administered medications on file prior to visit.     PAST MEDICAL HISTORY: Past Medical History:  Diagnosis Date  . Constipation   . Graves disease   . Hyperlipidemia   . IBS (irritable bowel syndrome)   . Thyroid disease   . Vitamin D deficiency     PAST SURGICAL HISTORY: Past Surgical History:  Procedure Laterality Date  . CESAREAN SECTION    . COLONOSCOPY      SOCIAL HISTORY: Social History   Tobacco Use  . Smoking status: Never Smoker  . Smokeless tobacco: Never Used  Substance Use Topics  . Alcohol use: Yes    Comment: occasional  . Drug use: No    FAMILY HISTORY: Family History  Problem Relation Age of Onset  . Diabetes Mother   . Cancer Mother        MMT Cancer  . Obesity Mother   . Throat cancer Father   . Sudden death Father   . Stroke Father     ROS: Review of Systems  Constitutional: Positive for malaise/fatigue. Negative for weight loss.  Cardiovascular: Negative for palpitations.  Endo/Heme/Allergies:       Negative hot/cold intolerance    PHYSICAL EXAM: Pt in no acute distress  RECENT LABS AND TESTS: BMET No results found for: NA, K, CL, CO2, GLUCOSE, BUN, CREATININE, CALCIUM, GFRNONAA, GFRAA Lab Results  Component Value Date   HGBA1C 5.4 01/25/2019   Lab Results  Component Value Date   INSULIN 13.0 01/25/2019   INSULIN 9.4 10/28/2018   CBC No results found for: WBC, RBC, HGB, HCT, PLT, MCV, MCH, MCHC, RDW, LYMPHSABS, MONOABS, EOSABS, BASOSABS Iron/TIBC/Ferritin/ %Sat No results found for: IRON, TIBC, FERRITIN, IRONPCTSAT Lipid Panel  No results found for: CHOL, TRIG, HDL, CHOLHDL, VLDL, LDLCALC, LDLDIRECT  Hepatic Function Panel  No results found for: PROT, ALBUMIN, AST, ALT, ALKPHOS, BILITOT, BILIDIR, IBILI No results found for: TSH    I, Burt KnackSharon Martin, am acting as transcriptionist for Debbra RidingAlexandria Kadolph, MD  I have reviewed the above documentation for accuracy and completeness, and I agree with the above. - Debbra RidingAlexandria Kadolph, MD

## 2019-05-25 ENCOUNTER — Encounter (INDEPENDENT_AMBULATORY_CARE_PROVIDER_SITE_OTHER): Payer: Self-pay | Admitting: Family Medicine

## 2019-05-30 ENCOUNTER — Ambulatory Visit (INDEPENDENT_AMBULATORY_CARE_PROVIDER_SITE_OTHER): Payer: 59 | Admitting: Family Medicine

## 2019-05-30 ENCOUNTER — Encounter (INDEPENDENT_AMBULATORY_CARE_PROVIDER_SITE_OTHER): Payer: Self-pay

## 2019-06-02 ENCOUNTER — Encounter (INDEPENDENT_AMBULATORY_CARE_PROVIDER_SITE_OTHER): Payer: Self-pay | Admitting: Family Medicine

## 2019-06-02 ENCOUNTER — Ambulatory Visit (INDEPENDENT_AMBULATORY_CARE_PROVIDER_SITE_OTHER): Payer: 59 | Admitting: Family Medicine

## 2019-06-02 ENCOUNTER — Other Ambulatory Visit: Payer: Self-pay

## 2019-06-02 DIAGNOSIS — E669 Obesity, unspecified: Secondary | ICD-10-CM

## 2019-06-02 DIAGNOSIS — Z6834 Body mass index (BMI) 34.0-34.9, adult: Secondary | ICD-10-CM | POA: Diagnosis not present

## 2019-06-02 DIAGNOSIS — R7303 Prediabetes: Secondary | ICD-10-CM

## 2019-06-02 DIAGNOSIS — E038 Other specified hypothyroidism: Secondary | ICD-10-CM | POA: Diagnosis not present

## 2019-06-02 NOTE — Progress Notes (Signed)
Office: [phone]  /  Fax: [phone] TeleHealth Visit:  [name] has verbally consented to this TeleHealth visit today. The patient is located at home, the provider is located at the [facility] office. The participants in this visit include the listed provider and patient. The visit was conducted today via webex.  HPI:   Chief Complaint: OBESITY Susan Shields is here to discuss her progress with her obesity treatment plan. She is on the Category 3 plan and is following her eating plan approximately 95 % of the time. She states she is doing cardio with a trainer for 30 minutes 7 times per week. Susan Shields's aunt passed away and she had to travel out of town. She just joined a group run by a trainer which she voices will increase her accountability. She has had a difficult time finding fresh vegetables.  We were unable to weigh the patient today for this TeleHealth visit. She feels as if she has maintained her weight since her last visit. She has lost 1 lb since starting treatment with [place].  Hypothyroidism Susan Shields has a diagnosis of hypothyroidism. She is currently on levothyroxine. She denies hot or cold intolerance or palpitations, but admits to ongoing fatigue.  Pre-Diabetes Susan Shields has a diagnosis of pre-diabetes based on her elevated Hgb A1c and was informed this puts her at greater risk of developing diabetes. She denies GI side effects of metformin and notes better control of symptoms with metformin. She continues to work on diet and exercise to decrease risk of diabetes.   ASSESSMENT AND PLAN:  Other specified hypothyroidism  Prediabetes  Class 1 obesity with serious comorbidity and body mass index (BMI) of 34.0 to 34.9 in adult, unspecified obesity type  PLAN:  Hypothyroidism Susan Shields was informed of the importance of good thyroid control to help with weight loss efforts. She was also informed that supertheraputic thyroid levels are dangerous and will not improve  weight loss results. Susan Shields agrees to continue taking levothyroxine, no refill needed. Susan Shields agrees to follow up with our clinic in 2 and a half weeks.  Pre-Diabetes Susan Shields will continue to work on weight loss, exercise, and decreasing simple carbohydrates in her diet to help decrease the risk of diabetes. We dicussed metformin including benefits and risks. She was informed that eating too many simple carbohydrates or too many calories at one sitting increases the likelihood of GI side effects. Susan Shields agrees to continue taking metformin, and she agrees to follow up with our clinic in 2 and a half weeks as directed to monitor her progress.  Obesity Susan Shields is currently in the action stage of change. As such, her goal is to continue with weight loss efforts She has agreed to follow the Category 3 plan Susan Shields has been instructed to work up to a goal of 150 minutes of combined cardio and strengthening exercise per week for weight loss and overall health benefits. We discussed the following Behavioral Modification Strategies today: increasing lean protein intake, increasing vegetables and work on meal planning and easy cooking plans, keeping healthy foods in the home, better snacking choices, and planning for success   Susan Shields has agreed to follow up with our clinic in 2 and a half weeks. She was informed of the importance of frequent follow up visits to maximize her success with intensive lifestyle modifications for her multiple health conditions.  ALLERGIES: No Known Allergies  MEDICATIONS: Current Outpatient Medications on File Prior to Visit  Medication Sig Dispense Refill  . cholecalciferol (VITAMIN D)  1000 units tablet Take 1,000 Units by mouth daily.    [name] Kitchen. levothyroxine (SYNTHROID, LEVOTHROID) 125 MCG tablet Take 125 mcg by mouth daily before breakfast.    . LINZESS 290 MCG CAPS capsule TAKE 1 CAPSULE BY MOUTH ONCE DAILY 90 capsule 0  . metFORMIN (GLUCOPHAGE) 500 MG tablet Take 1 tablet (500  mg total) by mouth 2 (two) times daily with a meal for 60 doses. 60 tablet 0   No current facility-administered medications on file prior to visit.     PAST MEDICAL HISTORY: Past Medical History:  Diagnosis Date  . Constipation   . Graves disease   . Hyperlipidemia   . IBS (irritable bowel syndrome)   . Thyroid disease   . Vitamin D deficiency     PAST SURGICAL HISTORY: Past Surgical History:  Procedure Laterality Date  . CESAREAN SECTION    . COLONOSCOPY      SOCIAL HISTORY: Social History   Tobacco Use  . Smoking status: Never Smoker  . Smokeless tobacco: Never Used  Substance Use Topics  . Alcohol use: Yes    Comment: occasional  . Drug use: No    FAMILY HISTORY: Family History  Problem Relation Age of Onset  . Diabetes Mother   . Cancer Mother        MMT Cancer  . Obesity Mother   . Throat cancer Father   . Sudden death Father   . Stroke Father     ROS: Review of Systems  Constitutional: Positive for malaise/fatigue. Negative for weight loss.  Cardiovascular: Negative for palpitations.  Endo/Heme/Allergies:       Negative hot/cold intolerance    PHYSICAL EXAM: Pt in no acute distress  RECENT LABS AND TESTS: BMET No results found for: NA, K, CL, CO2, GLUCOSE, BUN, CREATININE, CALCIUM, GFRNONAA, GFRAA Lab Results  Component Value Date   HGBA1C 5.4 01/25/2019   Lab Results  Component Value Date   INSULIN 13.0 01/25/2019   INSULIN 9.4 10/28/2018   CBC No results found for: WBC, RBC, HGB, HCT, PLT, MCV, MCH, MCHC, RDW, LYMPHSABS, MONOABS, EOSABS, BASOSABS Iron/TIBC/Ferritin/ %Sat No results found for: IRON, TIBC, FERRITIN, IRONPCTSAT Lipid Panel  No results found for: CHOL, TRIG, HDL, CHOLHDL, VLDL, LDLCALC, LDLDIRECT Hepatic Function Panel  No results found for: PROT, ALBUMIN, AST, ALT, ALKPHOS, BILITOT, BILIDIR, IBILI No results found for: TSH    I, Burt KnackSharon Martin, am acting as transcriptionist for Debbra RidingAlexandria Kadolph, MD  I have  reviewed the above documentation for accuracy and completeness, and I agree with the above. - Debbra RidingAlexandria Kadolph, MD

## 2019-06-20 ENCOUNTER — Encounter: Payer: Self-pay | Admitting: Family Medicine

## 2019-06-20 DIAGNOSIS — E559 Vitamin D deficiency, unspecified: Secondary | ICD-10-CM | POA: Diagnosis not present

## 2019-06-20 DIAGNOSIS — E890 Postprocedural hypothyroidism: Secondary | ICD-10-CM | POA: Diagnosis not present

## 2019-06-20 DIAGNOSIS — R7303 Prediabetes: Secondary | ICD-10-CM | POA: Diagnosis not present

## 2019-06-20 DIAGNOSIS — L509 Urticaria, unspecified: Secondary | ICD-10-CM | POA: Diagnosis not present

## 2019-06-21 ENCOUNTER — Ambulatory Visit (INDEPENDENT_AMBULATORY_CARE_PROVIDER_SITE_OTHER): Payer: 59 | Admitting: Family Medicine

## 2019-06-21 ENCOUNTER — Other Ambulatory Visit: Payer: Self-pay | Admitting: Gastroenterology

## 2019-06-21 ENCOUNTER — Telehealth: Payer: Self-pay | Admitting: Gastroenterology

## 2019-06-21 ENCOUNTER — Other Ambulatory Visit: Payer: Self-pay

## 2019-06-21 ENCOUNTER — Encounter (INDEPENDENT_AMBULATORY_CARE_PROVIDER_SITE_OTHER): Payer: Self-pay | Admitting: Family Medicine

## 2019-06-21 VITALS — BP 123/79 | HR 63 | Temp 98.1°F | Ht 65.0 in | Wt 212.0 lb

## 2019-06-21 DIAGNOSIS — Z6835 Body mass index (BMI) 35.0-35.9, adult: Secondary | ICD-10-CM | POA: Diagnosis not present

## 2019-06-21 DIAGNOSIS — E8881 Metabolic syndrome: Secondary | ICD-10-CM

## 2019-06-21 DIAGNOSIS — Z9189 Other specified personal risk factors, not elsewhere classified: Secondary | ICD-10-CM

## 2019-06-21 DIAGNOSIS — E038 Other specified hypothyroidism: Secondary | ICD-10-CM | POA: Diagnosis not present

## 2019-06-21 MED ORDER — METFORMIN HCL 500 MG PO TABS: 500.0000 mg | ORAL_TABLET | Freq: Two times a day (BID) | ORAL | 0 refills | Status: DC

## 2019-06-21 MED ORDER — LINZESS 290 MCG PO CAPS: 290.0000 ug | ORAL_CAPSULE | Freq: Every day | ORAL | 0 refills | Status: DC

## 2019-06-21 MED FILL — LINZESS 290 MCG CAPSULE: 290 | 90 days supply | Qty: 90 | Fill #0

## 2019-06-21 MED FILL — metFORMIN HCL 500 MG TABS: 500 | 30 days supply | Qty: 60 | Fill #0

## 2019-06-21 MED FILL — TRIAMCINOLONE 0.1% CREAM: 0.1 | 7 days supply | Qty: 30 | Fill #0

## 2019-06-21 MED FILL — LEVOTHYROXINE 125 MCG TABLE: 125 | 90 days supply | Qty: 90 | Fill #0

## 2019-06-21 NOTE — Telephone Encounter (Signed)
Pt would like rf for linzess sent to [facility] outpatient pharmacy.

## 2019-06-21 NOTE — Telephone Encounter (Signed)
Informed patient she is due for a follow up visit but we can send the refill to her pharmacy until her scheduled appt. Patient scheduled appt on 07/15/19 at 3pm. Prescription refill sent to [facility] outpatient pharmacy.

## 2019-06-22 NOTE — Progress Notes (Signed)
Office: [phone]  /  Fax: [phone]   HPI:   Chief Complaint: OBESITY Wynema is here to discuss her progress with her obesity treatment plan. She is on the Category 3 plan and is following her eating plan approximately 90 % of the time. She states she is exercising with a personal trainer for 20 minutes 7 times per week. Velia has been exercising daily with a personal trainer approximately 7 times per week. Raimi feels discouraged with the lack of weight loss, as she has been on point with her meal plan. She had labs done yesterday. Patient did a waist measurement yesterday, and it was 39 inches (originally 44 inches). Her weight is 212 lb (96.2 kg) today and has had a weight gain of 5 pounds since her last in-office visit. She has gained 4 lbs since starting treatment with [place].  Insulin Resistance Krishika has a diagnosis of insulin resistance based on her elevated fasting insulin level >5. Although Joclynn's blood glucose readings are still under good control, insulin resistance puts her at greater risk of metabolic syndrome and diabetes. She has no GI side effects on metformin. Nastasha continues to work on diet and exercise to decrease risk of diabetes. Elbia admits to carb cravings and indulgences, especially around her menstrual cycle.  At risk for diabetes Nashley is at higher than average risk for developing diabetes due to her obesity and insulin resistance. She currently denies polyuria or polydipsia.  Hypothyroidism Matison has a diagnosis of hypothyroidism. She is on levothyroxine. She denies hot or cold intolerance or palpitations, but does admit to fatigue.  ASSESSMENT AND PLAN:  Insulin resistance - Plan: metFORMIN (GLUCOPHAGE) 500 MG tablet  Other specified hypothyroidism  At risk for diabetes mellitus  Class 2 severe obesity with serious comorbidity and body mass index (BMI) of 35.0 to 35.9 in adult, unspecified obesity type ([place])  PLAN:  Insulin Resistance  Parthenia will continue to work on weight loss, exercise, and decreasing simple carbohydrates in her diet to help decrease the risk of diabetes. We dicussed metformin including benefits and risks. She was informed that eating too many simple carbohydrates or too many calories at one sitting increases the likelihood of GI side effects. Nikoleta agreed to continue metformin 500 mg qAM #30 with no refills and follow up with [place] as directed to monitor her progress.  Diabetes risk counseling Meri was given extended (15 minutes) diabetes prevention counseling today. She is 38 y.o. female and has risk factors for diabetes including obesity and insulin resistance. We discussed intensive lifestyle modifications today with an emphasis on weight loss as well as increasing exercise and decreasing simple carbohydrates in her diet.  Hypothyroidism Maeva was informed of the importance of good thyroid control to help with weight loss efforts. She was also informed that supertheraputic thyroid levels are dangerous and will not improve weight loss results. Shakeera will continue synthroid and follow up on labs done by her PCP yesterday.  Obesity Puja is currently in the action stage of change. As such, her goal is to continue with weight loss efforts She has agreed to follow the Category 3 plan Arriyana has been instructed to work up to a goal of 150 minutes of combined cardio and strengthening exercise per week for weight loss and overall health benefits. We discussed the following Behavioral Modification Strategies today: planning for success, increase H2O intake, keeping healthy foods in the home, increasing lean protein intake, increasing vegetables and work on meal planning and easy cooking plans  Lonia FarberJavata has agreed to follow up with our clinic in 2 weeks. She was informed of the importance of frequent follow up visits to maximize her success with intensive lifestyle modifications for her multiple health conditions.   ALLERGIES: No Known Allergies  MEDICATIONS: Current Outpatient Medications on File Prior to Visit  Medication Sig Dispense Refill  . cholecalciferol (VITAMIN D) 1000 units tablet Take 1,000 Units by mouth daily.    [name] Kitchen. levothyroxine (SYNTHROID, LEVOTHROID) 125 MCG tablet Take 125 mcg by mouth daily before breakfast.     No current facility-administered medications on file prior to visit.     PAST MEDICAL HISTORY: Past Medical History:  Diagnosis Date  . Constipation   . Graves disease   . Hyperlipidemia   . IBS (irritable bowel syndrome)   . Thyroid disease   . Vitamin D deficiency     PAST SURGICAL HISTORY: Past Surgical History:  Procedure Laterality Date  . CESAREAN SECTION    . COLONOSCOPY      SOCIAL HISTORY: Social History   Tobacco Use  . Smoking status: Never Smoker  . Smokeless tobacco: Never Used  Substance Use Topics  . Alcohol use: Yes    Comment: occasional  . Drug use: No    FAMILY HISTORY: Family History  Problem Relation Age of Onset  . Diabetes Mother   . Cancer Mother        MMT Cancer  . Obesity Mother   . Throat cancer Father   . Sudden death Father   . Stroke Father     ROS: Review of Systems  Constitutional: Positive for malaise/fatigue.  Cardiovascular: Negative for palpitations.  Genitourinary: Negative for frequency.  Endo/Heme/Allergies: Negative for polydipsia.       Negative for heat or cold intolerance Positive for carb cravings    PHYSICAL EXAM: Blood pressure 123/79, pulse 63, temperature 98.1 F (36.7 C), height 5\' 5"  (1.651 m), weight 212 lb (96.2 kg), last menstrual period 05/21/2019, SpO2 100 %. Body mass index is 35.28 kg/m. Physical Exam Vitals signs reviewed.  Constitutional:      Appearance: Normal appearance. She is well-developed. She is obese.  Cardiovascular:     Rate and Rhythm: Normal rate.  Pulmonary:     Effort: Pulmonary effort is normal.  Musculoskeletal: Normal range of motion.  Skin:     General: Skin is warm and dry.  Neurological:     Mental Status: She is alert and oriented to person, place, and time.  Psychiatric:        Mood and Affect: Mood normal.        Behavior: Behavior normal.     RECENT LABS AND TESTS: BMET No results found for: NA, K, CL, CO2, GLUCOSE, BUN, CREATININE, CALCIUM, GFRNONAA, GFRAA Lab Results  Component Value Date   HGBA1C 5.4 01/25/2019   Lab Results  Component Value Date   INSULIN 13.0 01/25/2019   INSULIN 9.4 10/28/2018   CBC No results found for: WBC, RBC, HGB, HCT, PLT, MCV, MCH, MCHC, RDW, LYMPHSABS, MONOABS, EOSABS, BASOSABS Iron/TIBC/Ferritin/ %Sat No results found for: IRON, TIBC, FERRITIN, IRONPCTSAT Lipid Panel  No results found for: CHOL, TRIG, HDL, CHOLHDL, VLDL, LDLCALC, LDLDIRECT Hepatic Function Panel  No results found for: PROT, ALBUMIN, AST, ALT, ALKPHOS, BILITOT, BILIDIR, IBILI No results found for: TSH    OBESITY BEHAVIORAL INTERVENTION VISIT  Today's visit was # 16   Starting weight: 208 lbs Starting date: 10/28/2018 Today's weight : 212 lbs Today's date: 06/21/2019 Total lbs lost to  date: 0    06/21/2019  Height 5\' 5"  (1.651 m)  Weight 212 lb (96.2 kg)  BMI (Calculated) 35.28  BLOOD PRESSURE - SYSTOLIC 123  BLOOD PRESSURE - DIASTOLIC 79   Body Fat % 38.2 %  Total Body Water (lbs) 83.8 lbs    ASK: We discussed the diagnosis of obesity with Khole R Dimascio today and Nelwyn agreed to give us permission to discuss obesity behavioral modification therapy today.  ASSESS: Lonia FarberJavata has the diagnosis of obesity and her BMI today is 35.28 Mikayla is in the action stage of change   ADVISE: Lonia FarberJavata was educated on the multiple health risks of obesity as well as the benefit of weight loss to improve her health. She was advised of the need for long term treatment and the importance of lifestyle modifications to improve her current health and to decrease her risk of future health problems.  AGREE: Multiple  dietary modification options and treatment options were discussed and  Pieper agreed to follow the recommendations documented in the above note.  ARRANGE: Lonia FarberJavata was educated on the importance of frequent visits to treat obesity as outlined per CMS and USPSTF guidelines and agreed to schedule her next follow up appointment today.  I, [place] CraneJoanne Murray, am acting as transcriptionist for Filbert SchilderAlexandria U. Kadolph, MD  I have reviewed the above documentation for accuracy and completeness, and I agree with the above. - Debbra RidingAlexandria Kadolph, MD

## 2019-07-06 ENCOUNTER — Other Ambulatory Visit: Payer: Self-pay

## 2019-07-06 ENCOUNTER — Ambulatory Visit (INDEPENDENT_AMBULATORY_CARE_PROVIDER_SITE_OTHER): Payer: 59 | Admitting: Family Medicine

## 2019-07-06 ENCOUNTER — Encounter (INDEPENDENT_AMBULATORY_CARE_PROVIDER_SITE_OTHER): Payer: Self-pay | Admitting: Family Medicine

## 2019-07-06 VITALS — BP 116/79 | HR 72 | Temp 98.1°F | Ht 65.0 in | Wt 208.0 lb

## 2019-07-06 DIAGNOSIS — E669 Obesity, unspecified: Secondary | ICD-10-CM | POA: Diagnosis not present

## 2019-07-06 DIAGNOSIS — Z6834 Body mass index (BMI) 34.0-34.9, adult: Secondary | ICD-10-CM

## 2019-07-06 DIAGNOSIS — E559 Vitamin D deficiency, unspecified: Secondary | ICD-10-CM | POA: Diagnosis not present

## 2019-07-06 DIAGNOSIS — E8881 Metabolic syndrome: Secondary | ICD-10-CM | POA: Diagnosis not present

## 2019-07-06 NOTE — Progress Notes (Signed)
Office: [phone]  /  Fax: [phone]   HPI:   Chief Complaint: OBESITY Susan Shields is here to discuss her progress with her obesity treatment plan. She is on the Category 3 plan and is following her eating plan approximately 95 % of the time. She states she is doing Zumba 45 minutes 3 times per week and she is walking 2 miles at work 5 times per week. Susan Shields is getting all the food in on the plan, but she is still struggling with getting all of the vegetables in. Susan Shields denies hunger. She snacks on popcorn, cheese, pickles and teddy grahams. Susan Shields see her GI doctor on July 17th. Her Mom's birthday is coming up. Her weight is 208 lb (94.3 kg) today and has had a weight loss of 4 pounds over a period of 2 to 3 weeks since her last visit. She has lost 0 lbs since starting treatment with us.  Insulin Resistance Susan Shields has a diagnosis of insulin resistance based on her elevated fasting insulin level >5. Although Susan Shields's blood glucose readings are still under good control, insulin resistance puts her at greater risk of metabolic syndrome and diabetes. She has no GI side effects on metformin. Susan Shields continues to work on diet and exercise to decrease risk of diabetes. [name] denies carb cravings.  Vitamin D deficiency Susan Shields has a diagnosis of vitamin D deficiency. Susan Shields is currently taking OTC vit D and she denies nausea, vomiting or muscle weakness. Labs from [facility] are pending.  ASSESSMENT AND PLAN:  Insulin resistance  Vitamin D deficiency  Class 1 obesity with serious comorbidity and body mass index (BMI) of 34.0 to 34.9 in adult, unspecified obesity type  PLAN:  Insulin Resistance Susan Shields will continue to work on weight loss, exercise, and decreasing simple carbohydrates in her diet to help decrease the risk of diabetes. We dicussed metformin including benefits and risks. She was informed that eating too many simple carbohydrates or too many calories at one sitting increases  the likelihood of GI side effects. Susan Shields agreed to continue metformin (no refill needed) and follow up with us as directed to monitor her progress.  Vitamin D Deficiency Susan Shields was informed that low vitamin D levels contributes to fatigue and are associated with obesity, breast, and colon cancer. She will continue to take OTC vitamin D 1,000 IU daily and will follow up for routine testing of vitamin D, at least 2-3 times per year. She was informed of the risk of over-replacement of vitamin D and agrees to not increase her dose unless she discusses this with us first. Susan Shields was encouraged to take 5,000 IU vitamin D daily.  Obesity Susan Shields is currently in the action stage of change. As such, her goal is to continue with weight loss efforts She has agreed to follow the Category 3 plan Susan Shields has been instructed to work up to a goal of 150 minutes of combined cardio and strengthening exercise per week for weight loss and overall health benefits. We discussed the following Behavioral Modification Strategies today: planning for success, keeping healthy foods in the home, increasing lean protein intake, increasing vegetables and work on meal planning and easy cooking plans  Susan Shields has agreed to follow up with our clinic in 2 weeks. She was informed of the importance of frequent follow up visits to maximize her success with intensive lifestyle modifications for her multiple health conditions.  ALLERGIES: No Known Allergies  MEDICATIONS: Current Outpatient Medications on File Prior to Visit  Medication Sig Dispense Refill  .  cholecalciferol (VITAMIN D) 1000 units tablet Take 1,000 Units by mouth daily.    [name] Kitchen levothyroxine (SYNTHROID, LEVOTHROID) 125 MCG tablet Take 125 mcg by mouth daily before breakfast.    . LINZESS 290 MCG CAPS capsule Take 1 capsule (290 mcg total) by mouth daily. 90 capsule 0  . metFORMIN (GLUCOPHAGE) 500 MG tablet Take 1 tablet (500 mg total) by mouth 2 (two) times daily with a  meal for 60 doses. 60 tablet 0   No current facility-administered medications on file prior to visit.     PAST MEDICAL HISTORY: Past Medical History:  Diagnosis Date  . Constipation   . Graves disease   . Hyperlipidemia   . IBS (irritable bowel syndrome)   . Thyroid disease   . Vitamin D deficiency     PAST SURGICAL HISTORY: Past Surgical History:  Procedure Laterality Date  . CESAREAN SECTION    . COLONOSCOPY      SOCIAL HISTORY: Social History   Tobacco Use  . Smoking status: Never Smoker  . Smokeless tobacco: Never Used  Substance Use Topics  . Alcohol use: Yes    Comment: occasional  . Drug use: No    FAMILY HISTORY: Family History  Problem Relation Age of Onset  . Diabetes Mother   . Cancer Mother        MMT Cancer  . Obesity Mother   . Throat cancer Father   . Sudden death Father   . Stroke Father     ROS: Review of Systems  Constitutional: Positive for weight loss.  Gastrointestinal: Negative for nausea and vomiting.  Musculoskeletal:       Negative for muscle weakness  Endo/Heme/Allergies:       Negative for polyphagia Negative for carb cravings    PHYSICAL EXAM: Blood pressure 116/79, pulse 72, temperature 98.1 F (36.7 C), temperature source Oral, height 5\' 5"  (1.651 m), weight 208 lb (94.3 kg), SpO2 97 %. Body mass index is 34.61 kg/m. Physical Exam Vitals signs reviewed.  Constitutional:      Appearance: Normal appearance. She is well-developed. She is obese.  Cardiovascular:     Rate and Rhythm: Normal rate.  Pulmonary:     Effort: Pulmonary effort is normal.  Musculoskeletal: Normal range of motion.  Skin:    General: Skin is warm and dry.  Neurological:     Mental Status: She is alert and oriented to person, place, and time.  Psychiatric:        Mood and Affect: Mood normal.        Behavior: Behavior normal.     RECENT LABS AND TESTS: BMET No results found for: NA, K, CL, CO2, GLUCOSE, BUN, CREATININE, CALCIUM,  GFRNONAA, GFRAA Lab Results  Component Value Date   HGBA1C 5.4 01/25/2019   Lab Results  Component Value Date   INSULIN 13.0 01/25/2019   INSULIN 9.4 10/28/2018   CBC No results found for: WBC, RBC, HGB, HCT, PLT, MCV, MCH, MCHC, RDW, LYMPHSABS, MONOABS, EOSABS, BASOSABS Iron/TIBC/Ferritin/ %Sat No results found for: IRON, TIBC, FERRITIN, IRONPCTSAT Lipid Panel  No results found for: CHOL, TRIG, HDL, CHOLHDL, VLDL, LDLCALC, LDLDIRECT Hepatic Function Panel  No results found for: PROT, ALBUMIN, AST, ALT, ALKPHOS, BILITOT, BILIDIR, IBILI No results found for: TSH    OBESITY BEHAVIORAL INTERVENTION VISIT  Today's visit was # 17   Starting weight: 208 lbs Starting date: 10/28/2018 Today's weight : 208 lbs  Today's date: 07/06/2019 Total lbs lost to date: 0    07/06/2019  Height 5\' 5"  (1.651 m)  Weight 208 lb (94.3 kg)  BMI (Calculated) 34.61  BLOOD PRESSURE - SYSTOLIC 116  BLOOD PRESSURE - DIASTOLIC 79   Body Fat % 36.3 %  Total Body Water (lbs) 82.4 lbs    ASK: We discussed the diagnosis of obesity with Estefania R Sterling today and Annel agreed to give us permission to discuss obesity behavioral modification therapy today.  ASSESS: Susan Shields has the diagnosis of obesity and her BMI today is 34.61 [place] is in the action stage of change   ADVISE: Susan Shields was educated on the multiple health risks of obesity as well as the benefit of weight loss to improve her health. She was advised of the need for long term treatment and the importance of lifestyle modifications to improve her current health and to decrease her risk of future health problems.  AGREE: Multiple dietary modification options and treatment options were discussed and  Glennis agreed to follow the recommendations documented in the above note.  ARRANGE: Susan Shields was educated on the importance of frequent visits to treat obesity as outlined per CMS and USPSTF guidelines and agreed to schedule her next follow up  appointment today.  I, [place] CraneJoanne Murray, am acting as transcriptionist for Filbert SchilderAlexandria U. Kadolph, MD  I have reviewed the above documentation for accuracy and completeness, and I agree with the above. - Debbra RidingAlexandria Kadolph, MD

## 2019-07-15 ENCOUNTER — Encounter: Payer: Self-pay | Admitting: Gastroenterology

## 2019-07-15 ENCOUNTER — Other Ambulatory Visit: Payer: Self-pay

## 2019-07-15 ENCOUNTER — Ambulatory Visit (INDEPENDENT_AMBULATORY_CARE_PROVIDER_SITE_OTHER): Payer: 59 | Admitting: Gastroenterology

## 2019-07-15 VITALS — Ht 64.0 in | Wt 206.0 lb

## 2019-07-15 DIAGNOSIS — K5904 Chronic idiopathic constipation: Secondary | ICD-10-CM | POA: Diagnosis not present

## 2019-07-15 MED ORDER — MOTEGRITY 2 MG PO TABS: 1.0000 | ORAL_TABLET | Freq: Every day | ORAL | 11 refills | Status: DC

## 2019-07-15 MED FILL — MOTEGRITY 2 MG TABS: 2 | 30 days supply | Qty: 30 | Fill #0

## 2019-07-15 NOTE — Patient Instructions (Addendum)
Discontinue Linzess.   We have sent the following medications to your pharmacy for you to pick up at your convenience: Motegrity.  If you do not feel like you are having a complete bowel movement, then take dulcolax or colon cleanse daily as needed.   Call if your symptoms are not under control in the next 2-3 weeks.   Thank you for choosing me and [facility].  [name] Riffle. Dagoberto Ligas., MD., Marval Regal

## 2019-07-15 NOTE — Progress Notes (Signed)
    History of Present Illness: This is a 38 year old female with chronic idiopathic constipation. Linzess 290 mcg has not been effective.  She needs to take either colon cleanse or Dulcolax every day in addition to daily Linzess for bowel movement.  Despite this regimen she still has bloating nausea and some lower abdominal discomfort.  She feels she may not be completely evacuating.  Current Medications, Allergies, Past Medical History, Past Surgical History, Family History and Social History were reviewed in [employer] record.   Physical Exam: Telemedicine - not performed   Assessment and Recommendations:  1. Chronic idiopathic constipation.  Symptoms not adequately controlled on current regimen.  Discontinue Linzess.  Begin Motegrity 2 mg daily.  If not effective for a complete bowel movement daily resume either Dulcolax or colon cleanse qd as needed.  Call if symptoms not under good control within the next 2 to 3 weeks. REV in 6 weeks.   These services were provided via telemedicine, audio and video.  The patient was at home and the provider was in the office, alone.  We discussed the limitations of evaluation and management by telemedicine and the availability of in person appointments.  Patient consented for this telemedicine visit and is aware of possible charges for this service.  Office CMA or LPN participated in this telemedicine service.  Time spent on call: 11 minutes

## 2019-07-19 ENCOUNTER — Encounter (INDEPENDENT_AMBULATORY_CARE_PROVIDER_SITE_OTHER): Payer: Self-pay | Admitting: Family Medicine

## 2019-07-20 ENCOUNTER — Ambulatory Visit (INDEPENDENT_AMBULATORY_CARE_PROVIDER_SITE_OTHER): Payer: 59 | Admitting: Family Medicine

## 2019-07-21 ENCOUNTER — Ambulatory Visit (INDEPENDENT_AMBULATORY_CARE_PROVIDER_SITE_OTHER): Payer: 59 | Admitting: Family Medicine

## 2019-07-26 ENCOUNTER — Ambulatory Visit (INDEPENDENT_AMBULATORY_CARE_PROVIDER_SITE_OTHER): Payer: 59 | Admitting: Family Medicine

## 2019-07-26 ENCOUNTER — Other Ambulatory Visit: Payer: Self-pay

## 2019-07-26 ENCOUNTER — Encounter (INDEPENDENT_AMBULATORY_CARE_PROVIDER_SITE_OTHER): Payer: Self-pay | Admitting: Family Medicine

## 2019-07-26 VITALS — BP 115/78 | HR 76 | Temp 98.7°F | Ht 64.0 in | Wt 210.0 lb

## 2019-07-26 DIAGNOSIS — Z6835 Body mass index (BMI) 35.0-35.9, adult: Secondary | ICD-10-CM | POA: Diagnosis not present

## 2019-07-26 DIAGNOSIS — E038 Other specified hypothyroidism: Secondary | ICD-10-CM | POA: Diagnosis not present

## 2019-07-26 DIAGNOSIS — E559 Vitamin D deficiency, unspecified: Secondary | ICD-10-CM

## 2019-07-26 NOTE — Progress Notes (Signed)
Office: [phone]  /  Fax: [phone]   HPI:   Chief Complaint: OBESITY Susan Shields is here to discuss her progress with her obesity treatment plan. She is on the Category 3 plan and is following her eating plan approximately 80 % of the time. She states she is exercising 0 minutes 0 times per week. Averianna voices last week was difficult. She had a menstrual cycle and really couldn't eat much of the food on the plan. She has zumba tonight, and she has all the food for Category 3 in the house. She has no plans to travel or go anywhere for the next few weeks.  Her weight is 210 lb (95.3 kg) today and has gained 2 lbs since her last visit. She has lost 0 lbs since starting treatment with [place].  Hypothyroidism Susan Shields has a diagnosis of hypothyroidism. She is currently on levothyroxine. Last labs were done recently but no results sent to the clinic. She denies hot or cold intolerance or palpitations, but does admit to ongoing fatigue.  Vitamin D Deficiency Susan Shields has a diagnosis of vitamin D deficiency. She is currently taking OTC Vit D. She notes fatigue and denies nausea, vomiting or muscle weakness.  ASSESSMENT AND PLAN:  Other specified hypothyroidism  Vitamin D deficiency  Class 2 severe obesity with serious comorbidity and body mass index (BMI) of 35.0 to 35.9 in adult, unspecified obesity type ([place])  PLAN:  Hypothyroidism Susan Shields was informed of the importance of good thyroid control to help with weight loss efforts. She was also informed that supertheraputic thyroid levels are dangerous and will not improve weight loss results. We will follow up on her previous labs. Susan Shields agrees to follow up with our clinic in 2 weeks.  Vitamin D Deficiency Susan Shields was informed that low vitamin D levels contributes to fatigue and are associated with obesity, breast, and colon cancer. [name] agrees to continue taking OTC Vit D 1,000 IU daily and will follow up for routine testing of vitamin D, at  least 2-3 times per year. She was informed of the risk of over-replacement of vitamin D and agrees to not increase her dose unless she discusses this with [place] first. Her labs were done previously and she will follow up with her doctor. Susan Shields agrees to follow up with our clinic in 2 weeks.  Obesity Susan Shields is currently in the action stage of change. As such, her goal is to continue with weight loss efforts She has agreed to follow the Category 3 plan Susan Shields has been instructed to work up to a goal of 150 minutes of combined cardio and strengthening exercise per week for weight loss and overall health benefits. We discussed the following Behavioral Modification Strategies today: increasing lean protein intake, increasing vegetables and work on meal planning and easy cooking plans, keeping healthy foods in the home, and planning for success   [name] has agreed to follow up with our clinic in 2 weeks. She was informed of the importance of frequent follow up visits to maximize her success with intensive lifestyle modifications for her multiple health conditions.  ALLERGIES: No Known Allergies  MEDICATIONS: Current Outpatient Medications on File Prior to Visit  Medication Sig Dispense Refill   bisacodyl (DULCOLAX) 5 MG EC tablet Take 5 mg by mouth daily as needed for moderate constipation.     cholecalciferol (VITAMIN D) 1000 units tablet Take 1,000 Units by mouth daily.     levothyroxine (SYNTHROID, LEVOTHROID) 125 MCG tablet Take 125 mcg by mouth daily before  breakfast.     metFORMIN (GLUCOPHAGE) 500 MG tablet Take 1 tablet (500 mg total) by mouth 2 (two) times daily with a meal for 60 doses. 60 tablet 0   Misc Natural Products (COLON CLEANSE) CAPS Take 1 capsule by mouth daily as needed.     Prucalopride Succinate (MOTEGRITY) 2 MG TABS Take 1 tablet by mouth daily. 30 tablet 11   No current facility-administered medications on file prior to visit.     PAST MEDICAL HISTORY: Past Medical  History:  Diagnosis Date   Constipation    Graves disease    Hyperlipidemia    IBS (irritable bowel syndrome)    Thyroid disease    Vitamin D deficiency     PAST SURGICAL HISTORY: Past Surgical History:  Procedure Laterality Date   CESAREAN SECTION     COLONOSCOPY      SOCIAL HISTORY: Social History   Tobacco Use   Smoking status: Never Smoker   Smokeless tobacco: Never Used  Substance Use Topics   Alcohol use: Yes    Comment: occasional   Drug use: No    FAMILY HISTORY: Family History  Problem Relation Age of Onset   Diabetes Mother    Cancer Mother        MMT Cancer   Obesity Mother    Throat cancer Father    Sudden death Father    Stroke Father     ROS: Review of Systems  Constitutional: Positive for malaise/fatigue. Negative for weight loss.  Cardiovascular: Negative for palpitations.  Gastrointestinal: Negative for nausea and vomiting.  Musculoskeletal:       Negative muscle weakness  Endo/Heme/Allergies:       Negative hot/cold intolerance    PHYSICAL EXAM: Blood pressure 115/78, pulse 76, temperature 98.7 F (37.1 C), temperature source Oral, height 5\' 4"  (1.626 m), weight 210 lb (95.3 kg), SpO2 98 %. Body mass index is 36.05 kg/m. Physical Exam Vitals signs reviewed.  Constitutional:      Appearance: Normal appearance. She is obese.  Cardiovascular:     Rate and Rhythm: Normal rate.     Pulses: Normal pulses.  Pulmonary:     Effort: Pulmonary effort is normal.     Breath sounds: Normal breath sounds.  Musculoskeletal: Normal range of motion.  Skin:    General: Skin is warm and dry.  Neurological:     Mental Status: She is alert and oriented to person, place, and time.  Psychiatric:        Mood and Affect: Mood normal.        Behavior: Behavior normal.     RECENT LABS AND TESTS: BMET No results found for: NA, K, CL, CO2, GLUCOSE, BUN, CREATININE, CALCIUM, GFRNONAA, GFRAA Lab Results  Component Value Date    HGBA1C 5.4 01/25/2019   Lab Results  Component Value Date   INSULIN 13.0 01/25/2019   INSULIN 9.4 10/28/2018   CBC No results found for: WBC, RBC, HGB, HCT, PLT, MCV, MCH, MCHC, RDW, LYMPHSABS, MONOABS, EOSABS, BASOSABS Iron/TIBC/Ferritin/ %Sat No results found for: IRON, TIBC, FERRITIN, IRONPCTSAT Lipid Panel  No results found for: CHOL, TRIG, HDL, CHOLHDL, VLDL, LDLCALC, LDLDIRECT Hepatic Function Panel  No results found for: PROT, ALBUMIN, AST, ALT, ALKPHOS, BILITOT, BILIDIR, IBILI No results found for: TSH    OBESITY BEHAVIORAL INTERVENTION VISIT  Today's visit was # 18   Starting weight: 208 lbs Starting date: 10/28/18 Today's weight : 210 lbs  Today's date: 07/26/2019 Total lbs lost to date: 0  ASK: We discussed the diagnosis of obesity with Joannie R Brent today and Davonda agreed to give us permission to discuss obesity behavioral modification therapy today.  ASSESS: Lonia FarberJavata has the diagnosis of obesity and her BMI today is 36.03 Beckham is in the action stage of change   ADVISE: Lonia FarberJavata was educated on the multiple health risks of obesity as well as the benefit of weight loss to improve her health. She was advised of the need for long term treatment and the importance of lifestyle modifications to improve her current health and to decrease her risk of future health problems.  AGREE: Multiple dietary modification options and treatment options were discussed and  Nyonna agreed to follow the recommendations documented in the above note.  ARRANGE: Lonia FarberJavata was educated on the importance of frequent visits to treat obesity as outlined per CMS and USPSTF guidelines and agreed to schedule her next follow up appointment today.  I, Burt KnackSharon Martin, am acting as transcriptionist for Debbra RidingAlexandria Kadolph, MD  I have reviewed the above documentation for accuracy and completeness, and I agree with the above. - Debbra RidingAlexandria Kadolph, MD

## 2019-07-28 ENCOUNTER — Encounter (INDEPENDENT_AMBULATORY_CARE_PROVIDER_SITE_OTHER): Payer: Self-pay | Admitting: Family Medicine

## 2019-07-28 NOTE — Telephone Encounter (Signed)
Please advise 

## 2019-08-09 ENCOUNTER — Other Ambulatory Visit: Payer: Self-pay

## 2019-08-09 ENCOUNTER — Encounter (INDEPENDENT_AMBULATORY_CARE_PROVIDER_SITE_OTHER): Payer: Self-pay | Admitting: Family Medicine

## 2019-08-09 ENCOUNTER — Ambulatory Visit (INDEPENDENT_AMBULATORY_CARE_PROVIDER_SITE_OTHER): Payer: 59 | Admitting: Family Medicine

## 2019-08-09 VITALS — BP 132/76 | HR 90 | Temp 98.4°F | Ht 64.0 in | Wt 208.0 lb

## 2019-08-09 DIAGNOSIS — Z9189 Other specified personal risk factors, not elsewhere classified: Secondary | ICD-10-CM | POA: Diagnosis not present

## 2019-08-09 DIAGNOSIS — E8881 Metabolic syndrome: Secondary | ICD-10-CM

## 2019-08-09 DIAGNOSIS — Z6835 Body mass index (BMI) 35.0-35.9, adult: Secondary | ICD-10-CM | POA: Diagnosis not present

## 2019-08-09 DIAGNOSIS — E038 Other specified hypothyroidism: Secondary | ICD-10-CM | POA: Diagnosis not present

## 2019-08-09 MED ORDER — VICTOZA 18 MG/3ML ~~LOC~~ SOPN: 0.6000 mg | PEN_INJECTOR | SUBCUTANEOUS | 0 refills | Status: DC

## 2019-08-09 MED FILL — VICTOZA 18 MG/3 ML INJECT P: 18 | 30 days supply | Qty: 3 | Fill #0

## 2019-08-11 NOTE — Progress Notes (Signed)
Office: [phone]  /  Fax: [phone]   HPI:   Chief Complaint: OBESITY Susan Shields is here to discuss her progress with her obesity treatment plan. She is on the Category 3 plan and is following her eating plan approximately 95 % of the time. She states she is doing Zumba, toning and cardio exercise 45 minutes 7 times per week. Susan Shields has had no difference in the past two weeks in eating, but she did have a weight loss of two pounds. She is having more stress at home revolving around her son's school schedule. Her weight is 208 lb (94.3 kg) today and has had a weight loss of 2 pounds over a period of 2 weeks since her last visit. She has lost 0 lbs since starting treatment with Susan Shields.  Insulin Resistance Susan Shields has a diagnosis of insulin resistance and she had a recent increase in her insulin level of 13.0. Insulin resistance puts her at greater risk of metabolic syndrome and diabetes. She admits to carb cravings. There has been no change in labs with metformin. He continues to work on diet and exercise to decrease the risk of diabetes.   At risk for diabetes Susan Shields is at higher than average risk for developing diabetes due to her obesity and insulin resistance. She currently denies polyuria or polydipsia.  Hypothyroidism Susan Shields has a diagnosis of hypothyroidism. She is on levothyroxine. She denies hot or cold intolerance or palpitations, but does admit to ongoing fatigue.  ASSESSMENT AND PLAN:  Insulin resistance - Plan: liraglutide (VICTOZA) 18 MG/3ML SOPN  Other specified hypothyroidism  At risk for diabetes mellitus  Class 2 severe obesity with serious comorbidity and body mass index (BMI) of 35.0 to 35.9 in adult, unspecified obesity type ([facility])  PLAN:  Insulin Resistance Susan Shields will continue to work on weight loss, exercise, and decreasing simple carbohydrates in her diet to help decrease the risk of diabetes. We dicussed metformin including benefits and risks. She was informed  that eating too many simple carbohydrates or too many calories at one sitting increases the likelihood of GI side effects. Susan Shields agrees to stop metformin and start Victoza 0.6 mg sub Q daily #1 pen with no refills and follow up with Susan Shields as directed to monitor her progress.  Diabetes risk counseling Susan Shields was given extended (15 minutes) diabetes prevention counseling today. She is 38 y.o. female and has risk factors for diabetes including obesity and insulin resistance. We discussed intensive lifestyle modifications today with an emphasis on weight loss as well as increasing exercise and decreasing simple carbohydrates in her diet.  Hypothyroidism Susan Shields was informed of the importance of good thyroid control to help with weight loss efforts. She was also informed that supertherapeutic thyroid levels are dangerous and will not improve weight loss results. Susan Shields agrees to continue levothyroxine and she will follow up at the agreed upon time.  Obesity Susan Shields is currently in the action stage of change. As such, her goal is to continue with weight loss efforts She has agreed to follow the Category 3 plan Susan Shields has been instructed to work up to a goal of 150 minutes of combined cardio and strengthening exercise per week for weight loss and overall health benefits. We discussed the following Behavioral Modification Strategies today: planning for success, keeping healthy foods in the home, increasing lean protein intake, increasing vegetables and work on meal planning and easy cooking plans  Susan Shields has agreed to follow up with our clinic in 2 weeks. She was informed of the  importance of frequent follow up visits to maximize her success with intensive lifestyle modifications for her multiple health conditions.  ALLERGIES: No Known Allergies  MEDICATIONS: Current Outpatient Medications on File Prior to Visit  Medication Sig Dispense Refill  . bisacodyl (DULCOLAX) 5 MG EC tablet Take 5 mg by mouth  daily as needed for moderate constipation.    . cholecalciferol (VITAMIN D) 1000 units tablet Take 1,000 Units by mouth daily.    [name] Kitchen levothyroxine (SYNTHROID, LEVOTHROID) 125 MCG tablet Take 125 mcg by mouth daily before breakfast.    . Misc Natural Products (COLON CLEANSE) CAPS Take 1 capsule by mouth daily as needed.    . Prucalopride Succinate (MOTEGRITY) 2 MG TABS Take 1 tablet by mouth daily. 30 tablet 11   No current facility-administered medications on file prior to visit.     PAST MEDICAL HISTORY: Past Medical History:  Diagnosis Date  . Constipation   . Graves disease   . Hyperlipidemia   . IBS (irritable bowel syndrome)   . Thyroid disease   . Vitamin D deficiency     PAST SURGICAL HISTORY: Past Surgical History:  Procedure Laterality Date  . CESAREAN SECTION    . COLONOSCOPY      SOCIAL HISTORY: Social History   Tobacco Use  . Smoking status: Never Smoker  . Smokeless tobacco: Never Used  Substance Use Topics  . Alcohol use: Yes    Comment: occasional  . Drug use: No    FAMILY HISTORY: Family History  Problem Relation Age of Onset  . Diabetes Mother   . Cancer Mother        MMT Cancer  . Obesity Mother   . Throat cancer Father   . Sudden death Father   . Stroke Father     ROS: Review of Systems  Constitutional: Positive for malaise/fatigue and weight loss.  Cardiovascular: Negative for palpitations.  Genitourinary: Negative for frequency.  Endo/Heme/Allergies: Negative for polydipsia.       Positive for carb cravings Negative for heat or cold intolerance    PHYSICAL EXAM: Blood pressure 132/76, pulse 90, temperature 98.4 F (36.9 C), temperature source Oral, height 5\' 4"  (1.626 m), weight 208 lb (94.3 kg), SpO2 98 %. Body mass index is 35.7 kg/m. Physical Exam Vitals signs reviewed.  Constitutional:      Appearance: Normal appearance. She is well-developed. She is obese.  Cardiovascular:     Rate and Rhythm: Normal rate.  Pulmonary:      Effort: Pulmonary effort is normal.  Musculoskeletal: Normal range of motion.  Skin:    General: Skin is warm and dry.  Neurological:     Mental Status: She is alert and oriented to person, place, and time.  Psychiatric:        Mood and Affect: Mood normal.        Behavior: Behavior normal.     RECENT LABS AND TESTS: BMET No results found for: NA, K, CL, CO2, GLUCOSE, BUN, CREATININE, CALCIUM, GFRNONAA, GFRAA Lab Results  Component Value Date   HGBA1C 5.4 01/25/2019   Lab Results  Component Value Date   INSULIN 13.0 01/25/2019   INSULIN 9.4 10/28/2018   CBC No results found for: WBC, RBC, HGB, HCT, PLT, MCV, MCH, MCHC, RDW, LYMPHSABS, MONOABS, EOSABS, BASOSABS Iron/TIBC/Ferritin/ %Sat No results found for: IRON, TIBC, FERRITIN, IRONPCTSAT Lipid Panel  No results found for: CHOL, TRIG, HDL, CHOLHDL, VLDL, LDLCALC, LDLDIRECT Hepatic Function Panel  No results found for: PROT, ALBUMIN, AST, ALT, ALKPHOS, BILITOT,  BILIDIR, IBILI No results found for: TSH    OBESITY BEHAVIORAL INTERVENTION VISIT  Today's visit was # 19   Starting weight: 208 lbs Starting date: 10/28/2018 Today's weight : 208 lbs Today's date: 08/09/2019 Total lbs lost to date: 0    08/09/2019  Height 5\' 4"  (1.626 m)  Weight 208 lb (94.3 kg)  BMI (Calculated) 35.69  BLOOD PRESSURE - SYSTOLIC 132  BLOOD PRESSURE - DIASTOLIC 76   Body Fat % 38.6 %  Total Body Water (lbs) 82.6 lbs    ASK: We discussed the diagnosis of obesity with Susan Shields today and Susan Shields agreed to give Susan Shields permission to discuss obesity behavioral modification therapy today.  ASSESS: Susan Shields has the diagnosis of obesity and her BMI today is 35.69 Susan Shields is in the action stage of change   ADVISE: Susan Shields was educated on the multiple health risks of obesity as well as the benefit of weight loss to improve her health. She was advised of the need for long term treatment and the importance of lifestyle modifications to improve her  current health and to decrease her risk of future health problems.  AGREE: Multiple dietary modification options and treatment options were discussed and  Susan Shields agreed to follow the recommendations documented in the above note.  ARRANGE: Susan Shields was educated on the importance of frequent visits to treat obesity as outlined per CMS and USPSTF guidelines and agreed to schedule her next follow up appointment today.  I, Susan Shields, am acting as transcriptionist for Susan SchilderAlexandria U. Kadolph, Susan Shields  I have reviewed the above documentation for accuracy and completeness, and I agree with the above. - Susan RidingAlexandria Kadolph, Susan Shields

## 2019-08-13 MED FILL — UNIFINE PENTIPS 6MM 31G: 31G X 6 MM | 90 days supply | Qty: 100 | Fill #0

## 2019-08-15 ENCOUNTER — Encounter (INDEPENDENT_AMBULATORY_CARE_PROVIDER_SITE_OTHER): Payer: Self-pay | Admitting: Family Medicine

## 2019-08-15 NOTE — Telephone Encounter (Signed)
Please advise 

## 2019-08-16 ENCOUNTER — Encounter (INDEPENDENT_AMBULATORY_CARE_PROVIDER_SITE_OTHER): Payer: Self-pay | Admitting: Family Medicine

## 2019-08-16 NOTE — Telephone Encounter (Signed)
Please reschedule 1 week out

## 2019-08-16 NOTE — Telephone Encounter (Signed)
She can move it out a week no problem

## 2019-08-16 NOTE — Telephone Encounter (Signed)
Please advise 

## 2019-08-18 NOTE — Telephone Encounter (Signed)
Yes. That appt needs to be pushed out a week per Dr. Adair Patter.

## 2019-08-18 NOTE — Telephone Encounter (Signed)
Patient is already scheduled to be seen on 8/25.

## 2019-08-23 ENCOUNTER — Ambulatory Visit (INDEPENDENT_AMBULATORY_CARE_PROVIDER_SITE_OTHER): Payer: 59 | Admitting: Family Medicine

## 2019-08-23 ENCOUNTER — Encounter (INDEPENDENT_AMBULATORY_CARE_PROVIDER_SITE_OTHER): Payer: Self-pay | Admitting: Family Medicine

## 2019-08-23 ENCOUNTER — Other Ambulatory Visit: Payer: Self-pay

## 2019-08-23 VITALS — BP 123/78 | HR 98 | Temp 98.3°F | Ht 64.0 in | Wt 209.0 lb

## 2019-08-23 DIAGNOSIS — E038 Other specified hypothyroidism: Secondary | ICD-10-CM

## 2019-08-23 DIAGNOSIS — E8881 Metabolic syndrome: Secondary | ICD-10-CM

## 2019-08-23 DIAGNOSIS — Z6835 Body mass index (BMI) 35.0-35.9, adult: Secondary | ICD-10-CM

## 2019-08-23 DIAGNOSIS — Z9189 Other specified personal risk factors, not elsewhere classified: Secondary | ICD-10-CM

## 2019-08-23 MED FILL — MOTEGRITY 2 MG TABS: 2 | 30 days supply | Qty: 30 | Fill #1

## 2019-08-25 NOTE — Progress Notes (Signed)
Office: [phone]  /  Fax: [phone]   HPI:   Chief Complaint: OBESITY Susan Shields is here to discuss her progress with her obesity treatment plan. She is on the Category 3 plan and is following her eating plan approximately 95 % of the time. She states she is doing Zumba 45 minutes 3 times per week, toning classes 45 minutes 1 time per week, and walking 50 minutes 5 times per week. Susan Shields noticed increased hunger with starting Victoza. She is not doing anything differently in terms of eating and exercise. Patient is getting in 8 ounces of meat at dinner and 1 1/2 cups of vegetables (secondary to difficulty finding vegetables at [facility]). Her weight is 209 lb (94.8 kg) today and has had a weight loss of 1 pound over a period of 2 weeks since her last visit. She has gained 1 lb since starting treatment with [place].  Hypothyroidism Susan Shields has a diagnosis of hypothyroidism. She is on levothyroxine. She denies hot or cold intolerance or palpitations, but does admit to ongoing fatigue.  Insulin Resistance Susan Shields has a diagnosis of insulin resistance based on her elevated fasting insulin level >5. Although Susan Shields's blood glucose readings are still under good control, insulin resistance puts her at greater risk of metabolic syndrome and diabetes. She noticed an increase in hunger. Susan Shields denies any GI side effects of Victoza. Susan Shields continues to work on diet and exercise to decrease risk of diabetes.  At risk for diabetes Susan Shields is at higher than average risk for developing diabetes due to her obesity and insulin resistance. She currently denies polyuria or polydipsia.  ASSESSMENT AND PLAN:  Other specified hypothyroidism  Insulin resistance  At risk for diabetes mellitus  Class 2 severe obesity with serious comorbidity and body mass index (BMI) of 35.0 to 35.9 in adult, unspecified obesity type (HCC)  PLAN:  Hypothyroidism Susan Shields was informed of the importance of good thyroid control to  help with weight loss efforts. She was also informed that supertherapeutic thyroid levels are dangerous and will not improve weight loss results. Susan Shields will continue levothyroxine at the current dose (no change in dose) and follow up as directed.  Insulin Resistance Susan Shields will continue to work on weight loss, exercise, and decreasing simple carbohydrates in her diet to help decrease the risk of diabetes. She was informed that eating too many simple carbohydrates or too many calories at one sitting increases the likelihood of GI side effects. Susan Shields agrees to increase Victoza to 0.9 mg sub Q daily (no refill needed) and follow up with [place] as directed to monitor her progress.  Diabetes risk counseling Susan Shields was given extended (15 minutes) diabetes prevention counseling today. She is 38 y.o. female and has risk factors for diabetes including obesity and insulin resistance. We discussed intensive lifestyle modifications today with an emphasis on weight loss as well as increasing exercise and decreasing simple carbohydrates in her diet.  Obesity Susan Shields is currently in the action stage of change. As such, her goal is to continue with weight loss efforts She has agreed to follow the Category 3 plan Susan Shields has been instructed to work up to a goal of 150 minutes of combined cardio and strengthening exercise per week for weight loss and overall health benefits. We discussed the following Behavioral Modification Strategies today: planning for success, keeping healthy foods in the home, increasing lean protein intake, increasing vegetables and work on meal planning and easy cooking plans  Susan Shields has agreed to follow up with our clinic in  2 weeks. She was informed of the importance of frequent follow up visits to maximize her success with intensive lifestyle modifications for her multiple health conditions.  ALLERGIES: No Known Allergies  MEDICATIONS: Current Outpatient Medications on File Prior to Visit   Medication Sig Dispense Refill  . bisacodyl (DULCOLAX) 5 MG EC tablet Take 5 mg by mouth daily as needed for moderate constipation.    . cholecalciferol (VITAMIN D) 1000 units tablet Take 1,000 Units by mouth daily.    [name] Kitchen. levothyroxine (SYNTHROID, LEVOTHROID) 125 MCG tablet Take 125 mcg by mouth daily before breakfast.    . liraglutide (VICTOZA) 18 MG/3ML SOPN Inject 0.1 mLs (0.6 mg total) into the skin every morning. 1 pen 0  . Misc Natural Products (COLON CLEANSE) CAPS Take 1 capsule by mouth daily as needed.    . Prucalopride Succinate (MOTEGRITY) 2 MG TABS Take 1 tablet by mouth daily. 30 tablet 11   No current facility-administered medications on file prior to visit.     PAST MEDICAL HISTORY: Past Medical History:  Diagnosis Date  . Constipation   . Graves disease   . Hyperlipidemia   . IBS (irritable bowel syndrome)   . Thyroid disease   . Vitamin D deficiency     PAST SURGICAL HISTORY: Past Surgical History:  Procedure Laterality Date  . CESAREAN SECTION    . COLONOSCOPY      SOCIAL HISTORY: Social History   Tobacco Use  . Smoking status: Never Smoker  . Smokeless tobacco: Never Used  Substance Use Topics  . Alcohol use: Yes    Comment: occasional  . Drug use: No    FAMILY HISTORY: Family History  Problem Relation Age of Onset  . Diabetes Mother   . Cancer Mother        MMT Cancer  . Obesity Mother   . Throat cancer Father   . Sudden death Father   . Stroke Father     ROS: Review of Systems  Constitutional: Positive for malaise/fatigue. Negative for weight loss.  Cardiovascular: Negative for palpitations.  Genitourinary: Negative for frequency.  Endo/Heme/Allergies: Negative for polydipsia.       Negative for hot or cold intolerance Positive for polyphagia    PHYSICAL EXAM: Blood pressure 123/78, pulse 98, temperature 98.3 F (36.8 C), temperature source Oral, height 5\' 4"  (1.626 m), weight 209 lb (94.8 kg), last menstrual period 08/18/2019,  SpO2 96 %. Body mass index is 35.87 kg/m. Physical Exam Vitals signs reviewed.  Constitutional:      Appearance: Normal appearance. She is well-developed. She is obese.  Cardiovascular:     Rate and Rhythm: Normal rate.  Pulmonary:     Effort: Pulmonary effort is normal.  Musculoskeletal: Normal range of motion.  Skin:    General: Skin is warm and dry.  Neurological:     Mental Status: She is alert and oriented to person, place, and time.  Psychiatric:        Mood and Affect: Mood normal.        Behavior: Behavior normal.     RECENT LABS AND TESTS: BMET No results found for: NA, K, CL, CO2, GLUCOSE, BUN, CREATININE, CALCIUM, GFRNONAA, GFRAA Lab Results  Component Value Date   HGBA1C 5.4 01/25/2019   Lab Results  Component Value Date   INSULIN 13.0 01/25/2019   INSULIN 9.4 10/28/2018   CBC No results found for: WBC, RBC, HGB, HCT, PLT, MCV, MCH, MCHC, RDW, LYMPHSABS, MONOABS, EOSABS, BASOSABS Iron/TIBC/Ferritin/ %Sat No results found for:  IRON, TIBC, FERRITIN, IRONPCTSAT Lipid Panel  No results found for: CHOL, TRIG, HDL, CHOLHDL, VLDL, LDLCALC, LDLDIRECT Hepatic Function Panel  No results found for: PROT, ALBUMIN, AST, ALT, ALKPHOS, BILITOT, BILIDIR, IBILI No results found for: TSH    OBESITY BEHAVIORAL INTERVENTION VISIT  Today's visit was # 20   Starting weight: 208 lbs Starting date: 10/28/2018 Today's weight : 209 lbs Today's date: 08/23/2019 Total lbs lost to date: 0   ASK: We discussed the diagnosis of obesity with Susan Shields today and Susan Shields agreed to give [place] permission to discuss obesity behavioral modification therapy today.  ASSESS: Susan Shields has the diagnosis of obesity and her BMI today is 35.86  Susan Shields is in the action stage of change   ADVISE: Ashey was educated on the multiple health risks of obesity as well as the benefit of weight loss to improve her health. She was advised of the need for long term treatment and the importance of  lifestyle modifications to improve her current health and to decrease her risk of future health problems.  AGREE: Multiple dietary modification options and treatment options were discussed and  Susan Shields agreed to follow the recommendations documented in the above note.  ARRANGE: Susan Shields was educated on the importance of frequent visits to treat obesity as outlined per CMS and USPSTF guidelines and agreed to schedule her next follow up appointment today.  I, Doreene Nest, am acting as transcriptionist for Eber Jones, MD  I have reviewed the above documentation for accuracy and completeness, and I agree with the above. - Ilene Qua, MD

## 2019-09-06 ENCOUNTER — Other Ambulatory Visit: Payer: Self-pay

## 2019-09-06 ENCOUNTER — Encounter (INDEPENDENT_AMBULATORY_CARE_PROVIDER_SITE_OTHER): Payer: Self-pay | Admitting: Family Medicine

## 2019-09-06 ENCOUNTER — Ambulatory Visit (INDEPENDENT_AMBULATORY_CARE_PROVIDER_SITE_OTHER): Payer: 59 | Admitting: Family Medicine

## 2019-09-06 ENCOUNTER — Encounter: Payer: Self-pay | Admitting: Family Medicine

## 2019-09-06 VITALS — BP 120/78 | HR 91 | Temp 98.3°F | Ht 64.0 in | Wt 207.0 lb

## 2019-09-06 DIAGNOSIS — Z9189 Other specified personal risk factors, not elsewhere classified: Secondary | ICD-10-CM | POA: Diagnosis not present

## 2019-09-06 DIAGNOSIS — E8881 Metabolic syndrome: Secondary | ICD-10-CM

## 2019-09-06 DIAGNOSIS — R0602 Shortness of breath: Secondary | ICD-10-CM

## 2019-09-06 DIAGNOSIS — Z6835 Body mass index (BMI) 35.0-35.9, adult: Secondary | ICD-10-CM | POA: Diagnosis not present

## 2019-09-06 MED ORDER — VICTOZA 18 MG/3ML ~~LOC~~ SOPN: 1.2000 mg | PEN_INJECTOR | SUBCUTANEOUS | 0 refills | Status: DC

## 2019-09-06 MED FILL — VICTOZA 18 MG/3 ML INJECT P: 18 | 30 days supply | Qty: 6 | Fill #0

## 2019-09-06 NOTE — Progress Notes (Signed)
Office: [phone]  /  Fax: [phone]   HPI:   Chief Complaint: OBESITY Susan Shields is here to discuss her progress with her obesity treatment plan. She is on the Category 3 plan and is following her eating plan approximately 70% of the time. She states she is doing Zumba/toning/walking 45 minutes 7 times per week. Susan Shields has done better with weight loss but is deviating more from her plan and extra calories are creeping in. She feels her hunger is starting to improve but is bored with her food options. Her weight is 207 lb (93.9 kg) today and has had a weight loss of 2 pounds over a period of 2 weeks since her last visit. She has lost 1 lb since starting treatment with [place].  Insulin Resistance Roman has a diagnosis of insulin resistance based on her elevated fasting insulin level >5. Although Susan Shields's blood glucose readings are still under good control, insulin resistance puts her at greater risk of metabolic syndrome and diabetes. Susan Shields started Victoza last visit and notes decreased polyphagia but is still struggling. She continues to work on diet and exercise to decrease risk of diabetes.  At risk for diabetes Susan Shields is at higher than average risk for developing diabetes due to her obesity. She currently denies polyuria or polydipsia.  Shortness of Air with Exercise Susan Shields is working on diet but feels no improvement in shortness of air even with increased exercise. She denies chest pain, orthopnea, and shortness of air at rest.  ASSESSMENT AND PLAN:  Insulin resistance - Plan: liraglutide (VICTOZA) 18 MG/3ML SOPN  SOB (shortness of breath) on exertion  At risk for diabetes mellitus  Class 2 severe obesity with serious comorbidity and body mass index (BMI) of 35.0 to 35.9 in adult, unspecified obesity type (HCC)  PLAN:  Insulin Resistance Susan Shields will continue to work on weight loss, exercise, and decreasing simple carbohydrates in her diet to help decrease the risk of diabetes.  We dicussed metformin including benefits and risks. She was informed that eating too many simple carbohydrates or too many calories at one sitting increases the likelihood of GI side effects. Susan Shields will increase Victoza to 1.2 mg #2 with 0 refills. She agrees to follow-up with our clinic in 2 weeks.  Diabetes risk counseling Susan Shields was given extended (15 minutes) diabetes prevention counseling today. She is 38 y.o. female and has risk factors for diabetes including obesity. We discussed intensive lifestyle modifications today with an emphasis on weight loss as well as increasing exercise and decreasing simple carbohydrates in her diet.  Shortness of Air with Exercise Susan Shields had repeat IC, which shows no change in RMR. She will continue diet and exercise and follow-up with [place] as directed to monitor her progress.  Obesity Susan Shields is currently in the action stage of change. As such, her goal is to continue with weight loss efforts. She has agreed to change and start keeping a food journal with 1300-1600 calories and 100+ grams of protein.  Susan Shields has been instructed to work up to a goal of 150 minutes of combined cardio and strengthening exercise per week for weight loss and overall health benefits. We discussed the following Behavioral Modification Strategies today: increasing lean protein intake and decreasing simple carbohydrates.  Susan Shields has agreed to follow-up with our clinic in 2 weeks. She was informed of the importance of frequent follow-up visits to maximize her success with intensive lifestyle modifications for her multiple health conditions.  ALLERGIES: No Known Allergies  MEDICATIONS: Current Outpatient Medications on  File Prior to Visit  Medication Sig Dispense Refill  . bisacodyl (DULCOLAX) 5 MG EC tablet Take 5 mg by mouth daily as needed for moderate constipation.    . cholecalciferol (VITAMIN D) 1000 units tablet Take 1,000 Units by mouth daily.    Susan Shields Kitchen levothyroxine (SYNTHROID,  LEVOTHROID) 125 MCG tablet Take 125 mcg by mouth daily before breakfast.    . Misc Natural Products (COLON CLEANSE) CAPS Take 1 capsule by mouth daily as needed.    . Prucalopride Succinate (MOTEGRITY) 2 MG TABS Take 1 tablet by mouth daily. 30 tablet 11   No current facility-administered medications on file prior to visit.     PAST MEDICAL HISTORY: Past Medical History:  Diagnosis Date  . Constipation   . Graves disease   . Hyperlipidemia   . IBS (irritable bowel syndrome)   . Thyroid disease   . Vitamin D deficiency     PAST SURGICAL HISTORY: Past Surgical History:  Procedure Laterality Date  . CESAREAN SECTION    . COLONOSCOPY      SOCIAL HISTORY: Social History   Tobacco Use  . Smoking status: Never Smoker  . Smokeless tobacco: Never Used  Substance Use Topics  . Alcohol use: Yes    Comment: occasional  . Drug use: No    FAMILY HISTORY: Family History  Problem Relation Age of Onset  . Diabetes Mother   . Cancer Mother        MMT Cancer  . Obesity Mother   . Throat cancer Father   . Sudden death Father   . Stroke Father    ROS: Review of Systems  Respiratory: Positive for shortness of breath (with exercise).        Negative for shortness of air at rest.  Cardiovascular: Negative for chest pain and orthopnea.  Endo/Heme/Allergies:       Positive for decreased polyphagia.   PHYSICAL EXAM: Blood pressure 120/78, pulse 91, temperature 98.3 F (36.8 C), temperature source Oral, height 5\' 4"  (1.626 m), weight 207 lb (93.9 kg), last menstrual period 08/18/2019, SpO2 99 %. Body mass index is 35.53 kg/m. Physical Exam Vitals signs reviewed.  Constitutional:      Appearance: Normal appearance. She is obese.  Cardiovascular:     Rate and Rhythm: Normal rate.     Pulses: Normal pulses.  Pulmonary:     Effort: Pulmonary effort is normal.     Breath sounds: Normal breath sounds.  Musculoskeletal: Normal range of motion.  Skin:    General: Skin is warm and  dry.  Neurological:     Mental Status: She is alert and oriented to person, place, and time.  Psychiatric:        Behavior: Behavior normal.   RECENT LABS AND TESTS: BMET No results found for: NA, K, CL, CO2, GLUCOSE, BUN, CREATININE, CALCIUM, GFRNONAA, GFRAA Lab Results  Component Value Date   HGBA1C 5.4 01/25/2019   Lab Results  Component Value Date   INSULIN 13.0 01/25/2019   INSULIN 9.4 10/28/2018   CBC No results found for: WBC, RBC, HGB, HCT, PLT, MCV, MCH, MCHC, RDW, LYMPHSABS, MONOABS, EOSABS, BASOSABS Iron/TIBC/Ferritin/ %Sat No results found for: IRON, TIBC, FERRITIN, IRONPCTSAT Lipid Panel  No results found for: CHOL, TRIG, HDL, CHOLHDL, VLDL, LDLCALC, LDLDIRECT Hepatic Function Panel  No results found for: PROT, ALBUMIN, AST, ALT, ALKPHOS, BILITOT, BILIDIR, IBILI No results found for: TSH  No results found for: Vitamin D, 25-Hydroxy  OBESITY BEHAVIORAL INTERVENTION VISIT  Today's visit was #21  Starting weight: 208 lbs Starting date: 10/28/2018 Today's weight: 207 lbs  Today's date: 09/06/2019 Total lbs lost to date: 1    09/06/2019  Height 5\' 4"  (1.626 m)  Weight 207 lb (93.9 kg)  BMI (Calculated) 35.51  BLOOD PRESSURE - SYSTOLIC 120  BLOOD PRESSURE - DIASTOLIC 78   Body Fat % 38.5 %  Total Body Water (lbs) 83.2 lbs  RMR 1880   ASK: We discussed the diagnosis of obesity with Lashe R Giraldo today and [place] agreed to give us permission to discuss obesity behavioral modification therapy today.  ASSESS: Lonia FarberJavata has the diagnosis of obesity and her BMI today is 35.6. Lonia FarberJavata is in the action stage of change.   ADVISE: Lonia FarberJavata was educated on the multiple health risks of obesity as well as the benefit of weight loss to improve her health. She was advised of the need for long term treatment and the importance of lifestyle modifications to improve her current health and to decrease her risk of future health problems.  AGREE: Multiple dietary modification  options and treatment options were discussed and  Lavette agreed to follow the recommendations documented in the above note.  ARRANGE: Lonia FarberJavata was educated on the importance of frequent visits to treat obesity as outlined per CMS and USPSTF guidelines and agreed to schedule her next follow up appointment today.  I, Marianna Paymentenise Haag, am acting as Energy managertranscriptionist for Quillian Quincearen Kean Gautreau, MD  I have reviewed the above documentation for accuracy and completeness, and I agree with the above. -Quillian Quincearen Nikolas Casher, MD

## 2019-09-19 ENCOUNTER — Other Ambulatory Visit: Payer: Self-pay

## 2019-09-19 ENCOUNTER — Ambulatory Visit (INDEPENDENT_AMBULATORY_CARE_PROVIDER_SITE_OTHER): Payer: 59 | Admitting: Family Medicine

## 2019-09-19 ENCOUNTER — Encounter (INDEPENDENT_AMBULATORY_CARE_PROVIDER_SITE_OTHER): Payer: Self-pay | Admitting: Family Medicine

## 2019-09-19 VITALS — BP 112/73 | HR 72 | Temp 98.3°F | Ht 64.0 in | Wt 204.0 lb

## 2019-09-19 DIAGNOSIS — Z6835 Body mass index (BMI) 35.0-35.9, adult: Secondary | ICD-10-CM

## 2019-09-19 DIAGNOSIS — R7303 Prediabetes: Secondary | ICD-10-CM | POA: Diagnosis not present

## 2019-09-21 NOTE — Progress Notes (Signed)
Office: [phone]  /  Fax: [phone]   HPI:   Chief Complaint: OBESITY Susan Shields is here to discuss her progress with her obesity treatment plan. She is on the keep a food journal with 1300 to 1600 calories and 100+ grams of protein daily or the Category 3 plan and is following her eating plan approximately 90 % of the time. She states she is doing Zumba, toning exercise and walking 45 minutes 5 times per week. Susan Shields continues to do well on a combination of the Category 3 and journaling. She is averaging 60 grams of protein and her simple carbohydrates have increased, but her calories are close to goal. Her weight is 204 lb (92.5 kg) today and has had a weight loss of 3 pounds over a period of 2 weeks since her last visit. She has lost 4 lbs since starting treatment with [place].  Pre-Diabetes Susan Shields has a diagnosis of prediabetes based on her elevated Hgb A1c and was informed this puts her at greater risk of developing diabetes. She notes decreased polyphagia on Victoza. She denies nausea, vomiting or diarrhea. Susan Shields sometimes doesn't eat all her calories. She is working on diet and exercise.   ASSESSMENT AND PLAN:  Prediabetes  Class 2 severe obesity with serious comorbidity and body mass index (BMI) of 35.0 to 35.9 in adult, unspecified obesity type (HCC)  PLAN:  Pre-Diabetes Susan Shields will continue to work on weight loss, exercise, and decreasing simple carbohydrates in her diet to help decrease the risk of diabetes. We dicussed metformin including benefits and risks. She was informed that eating too many simple carbohydrates or too many calories at one sitting increases the likelihood of GI side effects. Susan Shields will continue Victoza at 1.2 mg daily and we will follow. She will work on not cutting her calories too low. Susan Shields will follow up with [place] as directed to monitor her progress.  I spent > than 50% of the 15 minute visit on counseling as documented in the note.  Obesity Susan Shields  is currently in the action stage of change. As such, her goal is to continue with weight loss efforts She has agreed to keep a food journal with 1300 to 1600 calories and 100+ grams of protein daily or follow the Category 3 plan Susan Shields has been instructed to work up to a goal of 150 minutes of combined cardio and strengthening exercise per week for weight loss and overall health benefits. We discussed the following Behavioral Modification Strategies today: increasing lean protein intake  Susan Shields has agreed to follow up with our clinic in 2 or 3 weeks. She was informed of the importance of frequent follow up visits to maximize her success with intensive lifestyle modifications for her multiple health conditions.  ALLERGIES: No Known Allergies  MEDICATIONS: Current Outpatient Medications on File Prior to Visit  Medication Sig Dispense Refill  . bisacodyl (DULCOLAX) 5 MG EC tablet Take 5 mg by mouth daily as needed for moderate constipation.    . cholecalciferol (VITAMIN D) 1000 units tablet Take 1,000 Units by mouth daily.    [name] Kitchen levothyroxine (SYNTHROID, LEVOTHROID) 125 MCG tablet Take 125 mcg by mouth daily before breakfast.    . liraglutide (VICTOZA) 18 MG/3ML SOPN Inject 0.2 mLs (1.2 mg total) into the skin every morning. 2 pen 0  . Misc Natural Products (COLON CLEANSE) CAPS Take 1 capsule by mouth daily as needed.    . Prucalopride Succinate (MOTEGRITY) 2 MG TABS Take 1 tablet by mouth daily. 30 tablet 11  No current facility-administered medications on file prior to visit.     PAST MEDICAL HISTORY: Past Medical History:  Diagnosis Date  . Constipation   . Graves disease   . Hyperlipidemia   . IBS (irritable bowel syndrome)   . Thyroid disease   . Vitamin D deficiency     PAST SURGICAL HISTORY: Past Surgical History:  Procedure Laterality Date  . CESAREAN SECTION    . COLONOSCOPY      SOCIAL HISTORY: Social History   Tobacco Use  . Smoking status: Never Smoker  .  Smokeless tobacco: Never Used  Substance Use Topics  . Alcohol use: Yes    Comment: occasional  . Drug use: No    FAMILY HISTORY: Family History  Problem Relation Age of Onset  . Diabetes Mother   . Cancer Mother        MMT Cancer  . Obesity Mother   . Throat cancer Father   . Sudden death Father   . Stroke Father     ROS: Review of Systems  Constitutional: Positive for weight loss.  Gastrointestinal: Negative for diarrhea, nausea and vomiting.  Musculoskeletal:       Negative for muscle weakness  Endo/Heme/Allergies:       Positive for polyphagia    PHYSICAL EXAM: Blood pressure 112/73, pulse 72, temperature 98.3 F (36.8 C), temperature source Oral, height 5\' 4"  (1.626 m), weight 204 lb (92.5 kg), last menstrual period 09/14/2019, SpO2 98 %. Body mass index is 35.02 kg/m. Physical Exam Vitals signs reviewed.  Constitutional:      Appearance: Normal appearance. She is well-developed. She is obese.  Cardiovascular:     Rate and Rhythm: Normal rate.  Pulmonary:     Effort: Pulmonary effort is normal.  Musculoskeletal: Normal range of motion.  Skin:    General: Skin is warm and dry.  Neurological:     Mental Status: She is alert and oriented to person, place, and time.  Psychiatric:        Mood and Affect: Mood normal.        Behavior: Behavior normal.     RECENT LABS AND TESTS: BMET No results found for: NA, K, CL, CO2, GLUCOSE, BUN, CREATININE, CALCIUM, GFRNONAA, GFRAA Lab Results  Component Value Date   HGBA1C 5.4 01/25/2019   Lab Results  Component Value Date   INSULIN 13.0 01/25/2019   INSULIN 9.4 10/28/2018   CBC No results found for: WBC, RBC, HGB, HCT, PLT, MCV, MCH, MCHC, RDW, LYMPHSABS, MONOABS, EOSABS, BASOSABS Iron/TIBC/Ferritin/ %Sat No results found for: IRON, TIBC, FERRITIN, IRONPCTSAT Lipid Panel  No results found for: CHOL, TRIG, HDL, CHOLHDL, VLDL, LDLCALC, LDLDIRECT Hepatic Function Panel  No results found for: PROT, ALBUMIN,  AST, ALT, ALKPHOS, BILITOT, BILIDIR, IBILI No results found for: TSH    OBESITY BEHAVIORAL INTERVENTION VISIT  Today's visit was # 22  Starting weight: 208 lbs Starting date: 10/28/2018 Today's weight : 204 lbs Today's date: 09/19/2019 Total lbs lost to date: 4    09/19/2019  Height 5\' 4"  (1.626 m)  Weight 204 lb (92.5 kg)  BMI (Calculated) 35  BLOOD PRESSURE - SYSTOLIC 233  BLOOD PRESSURE - DIASTOLIC 73   Body Fat % 00.7 %  Total Body Water (lbs) 80.6 lbs    ASK: We discussed the diagnosis of obesity with Susan Shields today and Susan Shields agreed to give [place] permission to discuss obesity behavioral modification therapy today.  ASSESS: Ainslie has the diagnosis of obesity and her BMI today  is 41 Nyajah is in the action stage of change   ADVISE: Susan Shields was educated on the multiple health risks of obesity as well as the benefit of weight loss to improve her health. She was advised of the need for long term treatment and the importance of lifestyle modifications to improve her current health and to decrease her risk of future health problems.  AGREE: Multiple dietary modification options and treatment options were discussed and  Susan Shields agreed to follow the recommendations documented in the above note.  ARRANGE: Susan Shields was educated on the importance of frequent visits to treat obesity as outlined per CMS and USPSTF guidelines and agreed to schedule her next follow up appointment today.  I, [place] Crane, am acting as transcriptionist for Quillian Quince, MD  I have reviewed the above documentation for accuracy and completeness, and I agree with the above. -Quillian Quince, MD

## 2019-09-30 MED FILL — LEVOTHYROXINE 125 MCG TABLE: 125 | 90 days supply | Qty: 90 | Fill #1

## 2019-10-10 ENCOUNTER — Ambulatory Visit (INDEPENDENT_AMBULATORY_CARE_PROVIDER_SITE_OTHER): Payer: 59 | Admitting: Family Medicine

## 2019-10-10 ENCOUNTER — Encounter (INDEPENDENT_AMBULATORY_CARE_PROVIDER_SITE_OTHER): Payer: Self-pay | Admitting: Family Medicine

## 2019-10-10 ENCOUNTER — Other Ambulatory Visit: Payer: Self-pay

## 2019-10-10 VITALS — BP 119/81 | HR 76 | Temp 98.1°F | Ht 64.0 in | Wt 203.0 lb

## 2019-10-10 DIAGNOSIS — R7303 Prediabetes: Secondary | ICD-10-CM

## 2019-10-10 DIAGNOSIS — Z9189 Other specified personal risk factors, not elsewhere classified: Secondary | ICD-10-CM | POA: Diagnosis not present

## 2019-10-10 DIAGNOSIS — Z6834 Body mass index (BMI) 34.0-34.9, adult: Secondary | ICD-10-CM

## 2019-10-10 DIAGNOSIS — E669 Obesity, unspecified: Secondary | ICD-10-CM

## 2019-10-10 MED ORDER — VICTOZA 18 MG/3ML ~~LOC~~ SOPN: 1.2000 mg | PEN_INJECTOR | SUBCUTANEOUS | 0 refills | Status: DC

## 2019-10-10 MED FILL — VICTOZA 18 MG/3 ML INJECT P: 18 | 30 days supply | Qty: 6 | Fill #0

## 2019-10-10 NOTE — Progress Notes (Signed)
Office: [phone]  /  Fax: [phone]   HPI:   Chief Complaint: OBESITY Shayla is here to discuss her progress with her obesity treatment plan. She is keeping a food journal with 1300 to 1600 calories and 100+ grams of protein and following the Category 3 plan and is following her eating plan approximately 95 % of the time. She states she is walking, doing Zumba, and toning exercises for 45 minutes 5 times per week. Shalamar continues to do well with weight loss. She is going in between journaling and Category 3. Her hunger is controlled.   Her weight is 203 lb (92.1 kg) today and has had a weight loss of 1 pound over a period of 3 weeks since her last visit. She has lost 5 lbs since starting treatment with [place].  Pre-Diabetes Phung has a diagnosis of pre-diabetes based on her elevated Hgb A1c and was informed this puts her at greater risk of developing diabetes. She is doing well on her diet and Victoza and she feels that the 1.2 dose is perfect so that she can still eat, but her excessive hunger is reduced. Jaquanna continues to work on diet and exercise to decrease risk of diabetes. She denies nausea, vomiting, or hypoglycemia.   At risk for diabetes Linnell is at higher than average risk for developing diabetes due to her pre-diabetes and obesity.   ASSESSMENT AND PLAN:  Prediabetes - Plan: liraglutide (VICTOZA) 18 MG/3ML SOPN  At risk for diabetes mellitus  Class 1 obesity with serious comorbidity and body mass index (BMI) of 34.0 to 34.9 in adult, unspecified obesity type  PLAN:  Pre-Diabetes Halston will continue to work on weight loss, exercise, and decreasing simple carbohydrates in her diet to help decrease the risk of diabetes. She was informed that eating too many simple carbohydrates or too many calories at one sitting increases the likelihood of GI side effects. Arbor agreed to continue Victoza 1.2 mg SQ qAM #2 pens with no refills and a prescription was written today.  Natacia agreed to follow up with [place] as directed to monitor her progress in 2 to 3 weeks.   Diabetes risk counseling Elya was given extended (15 minutes) diabetes prevention counseling today. She is 38 y.o. female and has risk factors for diabetes including pre-diabetes and obesity. We discussed intensive lifestyle modifications today with an emphasis on weight loss as well as increasing exercise and decreasing simple carbohydrates in her diet.  Obesity Isobel is currently in the action stage of change. As such, her goal is to continue with weight loss efforts. She has agreed to keep a food journal with 1300 to 1600 calories and 100+ grams of protein or follow the Category 3 plan. Rayleigh has been instructed to work up to a goal of 150 minutes of combined cardio and strengthening exercise per week for weight loss and overall health benefits. We discussed the following Behavioral Modification Strategies today: ways to avoid boredom eating and decrease liquid calories  Diera has agreed to follow up with our clinic in 2 to 3 weeks. She was informed of the importance of frequent follow up visits to maximize her success with intensive lifestyle modifications for her multiple health conditions.  ALLERGIES: No Known Allergies  MEDICATIONS: Current Outpatient Medications on File Prior to Visit  Medication Sig Dispense Refill  . bisacodyl (DULCOLAX) 5 MG EC tablet Take 5 mg by mouth daily as needed for moderate constipation.    . cholecalciferol (VITAMIN D) 1000 units tablet Take  1,000 Units by mouth daily.    [name] Kitchen levothyroxine (SYNTHROID, LEVOTHROID) 125 MCG tablet Take 125 mcg by mouth daily before breakfast.    . Misc Natural Products (COLON CLEANSE) CAPS Take 1 capsule by mouth daily as needed.    . Prucalopride Succinate (MOTEGRITY) 2 MG TABS Take 1 tablet by mouth daily. 30 tablet 11   No current facility-administered medications on file prior to visit.     PAST MEDICAL HISTORY: Past Medical  History:  Diagnosis Date  . Constipation   . Graves disease   . Hyperlipidemia   . IBS (irritable bowel syndrome)   . Thyroid disease   . Vitamin D deficiency     PAST SURGICAL HISTORY: Past Surgical History:  Procedure Laterality Date  . CESAREAN SECTION    . COLONOSCOPY      SOCIAL HISTORY: Social History   Tobacco Use  . Smoking status: Never Smoker  . Smokeless tobacco: Never Used  Substance Use Topics  . Alcohol use: Yes    Comment: occasional  . Drug use: No    FAMILY HISTORY: Family History  Problem Relation Age of Onset  . Diabetes Mother   . Cancer Mother        MMT Cancer  . Obesity Mother   . Throat cancer Father   . Sudden death Father   . Stroke Father     ROS: Review of Systems  Constitutional: Positive for weight loss.  Gastrointestinal: Negative for nausea and vomiting.  Endo/Heme/Allergies:       Negative for hypoglycemia.    PHYSICAL EXAM: Blood pressure 119/81, pulse 76, temperature 98.1 F (36.7 C), temperature source Oral, height 5\' 4"  (1.626 m), weight 203 lb (92.1 kg), last menstrual period 09/14/2019, SpO2 98 %. Body mass index is 34.84 kg/m. Physical Exam Vitals signs reviewed.  Constitutional:      Appearance: Normal appearance. She is obese.  Cardiovascular:     Rate and Rhythm: Normal rate.  Pulmonary:     Effort: Pulmonary effort is normal.  Musculoskeletal: Normal range of motion.  Skin:    General: Skin is warm and dry.  Neurological:     Mental Status: She is alert and oriented to person, place, and time.  Psychiatric:        Mood and Affect: Mood normal.        Behavior: Behavior normal.     RECENT LABS AND TESTS: BMET No results found for: NA, K, CL, CO2, GLUCOSE, BUN, CREATININE, CALCIUM, GFRNONAA, GFRAA Lab Results  Component Value Date   HGBA1C 5.4 01/25/2019   Lab Results  Component Value Date   INSULIN 13.0 01/25/2019   INSULIN 9.4 10/28/2018   CBC No results found for: WBC, RBC, HGB, HCT,  PLT, MCV, MCH, MCHC, RDW, LYMPHSABS, MONOABS, EOSABS, BASOSABS Iron/TIBC/Ferritin/ %Sat No results found for: IRON, TIBC, FERRITIN, IRONPCTSAT Lipid Panel  No results found for: CHOL, TRIG, HDL, CHOLHDL, VLDL, LDLCALC, LDLDIRECT Hepatic Function Panel  No results found for: PROT, ALBUMIN, AST, ALT, ALKPHOS, BILITOT, BILIDIR, IBILI No results found for: TSH    OBESITY BEHAVIORAL INTERVENTION VISIT  Today's visit was # 23  Starting weight: 208 lbs Starting date: 10/28/18 Today's weight : Weight: 203 lb (92.1 kg)  Today's date: 10/10/2019 Total lbs lost to date: 5    10/10/2019  Height 5\' 4"  (1.626 m)  Weight 203 lb (92.1 kg)  BMI (Calculated) 34.83  BLOOD PRESSURE - SYSTOLIC 119  BLOOD PRESSURE - DIASTOLIC 81   Body Fat %  37.7 %  Total Body Water (lbs) 81 lbs    ASK: We discussed the diagnosis of obesity with Izzabelle R Muma today and Kian agreed to give us permission to discuss obesity behavioral modification therapy today.  ASSESS: Lonia FarberJavata has the diagnosis of obesity and her BMI today is 34.83. Lonia FarberJavata is in the action stage of change.   ADVISE: Lonia FarberJavata was educated on the multiple health risks of obesity as well as the benefit of weight loss to improve her health. She was advised of the need for long term treatment and the importance of lifestyle modifications to improve her current health and to decrease her risk of future health problems.  AGREE: Multiple dietary modification options and treatment options were discussed and Kailin agreed to follow the recommendations documented in the above note.  ARRANGE: Lonia FarberJavata was educated on the importance of frequent visits to treat obesity as outlined per CMS and USPSTF guidelines and agreed to schedule her next follow up appointment today.  IKirke Corin, Cara Soares, CMA, am acting as transcriptionist for Wilder Gladearen D. Octavious Zidek, MD I have reviewed the above documentation for accuracy and completeness, and I agree with the above. -Quillian Quincearen Waver Dibiasio,  MD

## 2019-10-10 NOTE — Telephone Encounter (Signed)
Please advise 

## 2019-10-17 ENCOUNTER — Other Ambulatory Visit: Payer: Self-pay

## 2019-10-17 MED ORDER — LINACLOTIDE 290 MCG PO CAPS: 290.0000 ug | ORAL_CAPSULE | Freq: Every day | ORAL | 3 refills | Status: DC

## 2019-10-17 MED FILL — LINZESS 290 MCG CAPSULE: 290 | 90 days supply | Qty: 90 | Fill #0

## 2019-10-27 ENCOUNTER — Ambulatory Visit (INDEPENDENT_AMBULATORY_CARE_PROVIDER_SITE_OTHER): Payer: 59 | Admitting: Family Medicine

## 2019-10-27 ENCOUNTER — Other Ambulatory Visit: Payer: Self-pay

## 2019-10-27 ENCOUNTER — Encounter (INDEPENDENT_AMBULATORY_CARE_PROVIDER_SITE_OTHER): Payer: Self-pay | Admitting: Family Medicine

## 2019-10-27 VITALS — BP 119/78 | HR 90 | Temp 98.2°F | Ht 64.0 in | Wt 203.0 lb

## 2019-10-27 DIAGNOSIS — R7303 Prediabetes: Secondary | ICD-10-CM | POA: Diagnosis not present

## 2019-10-27 DIAGNOSIS — Z9189 Other specified personal risk factors, not elsewhere classified: Secondary | ICD-10-CM

## 2019-10-27 DIAGNOSIS — E669 Obesity, unspecified: Secondary | ICD-10-CM

## 2019-10-27 DIAGNOSIS — Z6834 Body mass index (BMI) 34.0-34.9, adult: Secondary | ICD-10-CM | POA: Diagnosis not present

## 2019-10-27 MED ORDER — VICTOZA 18 MG/3ML ~~LOC~~ SOPN: 1.2000 mg | PEN_INJECTOR | SUBCUTANEOUS | 0 refills | Status: DC

## 2019-10-27 NOTE — Progress Notes (Signed)
Office: [phone]  /  Fax: [phone]   HPI:   Chief Complaint: OBESITY Susan Shields is here to discuss her progress with her obesity treatment plan. She is on the  follow the Category 3 plan and is following her eating plan approximately 95 % of the time. She states she is exercising by doing Zumba, toning classes, and walking for 30-45 minutes 4-5 times per week. Senaya continues to do well with weight loss. She has increased exercise and feels well overall. Her hunger is controlled and she has changed back from journaling to Category 3 plan.  Her weight is 203 lb (92.1 kg) today and has not lost weight since her last visit. She has lost 5 lbs since starting treatment with [place].  Pre-Diabetes Susan Shields has a diagnosis of prediabetes based on her elevated HgA1c and was informed this puts her at greater risk of developing diabetes. She is taking Victoza, tolerating 1.2 mg well. She reports a decrease in polyphagia and continues to work on diet and exercise to decrease risk of diabetes. She denies nausea or hypoglycemia.  At risk for diabetes Susan Shields is at higher than averagerisk for developing diabetes due to her obesity. She currently denies polyuria or polydipsia.  ASSESSMENT AND PLAN:  Prediabetes - Plan: liraglutide (VICTOZA) 18 MG/3ML SOPN  At risk for diabetes mellitus  Class 1 obesity with serious comorbidity and body mass index (BMI) of 34.0 to 34.9 in adult, unspecified obesity type  PLAN: Pre-Diabetes Susan Shields will continue to work on weight loss, exercise, and decreasing simple carbohydrates in her diet to help decrease the risk of diabetes. We dicussed metformin including benefits and risks. She was informed that eating too many simple carbohydrates or too many calories at one sitting increases the likelihood of GI side effects. Susan Shields agreed to continue Victoza 1.2 mg subq daily #2 pens with no refills. Susan Shields agreed to follow up with [place] as directed to monitor her progress.   Diabetes risk counseling Susan Shields was given extended (15 minutes) diabetes prevention counseling today. She is 38 y.o. female and has risk factors for diabetes including obesity. We discussed intensive lifestyle modifications today with an emphasis on weight loss as well as increasing exercise and decreasing simple carbohydrates in her diet.  Obesity Susan Shields is currently in the action stage of change. As such, her goal is to continue with weight loss efforts She has agreed to follow the Category 3 plan Susan Shields has been instructed to work up to a goal of 150 minutes of combined cardio and strengthening exercise per week for weight loss and overall health benefits. We discussed the following Behavioral Modification Strategies today: increasing lean protein intake and decreasing simple carbohydrates   Susan Shields has agreed to follow up with our clinic in 2-3 weeks. She was informed of the importance of frequent follow up visits to maximize her success with intensive lifestyle modifications for her multiple health conditions.  ALLERGIES: No Known Allergies  MEDICATIONS: Current Outpatient Medications on File Prior to Visit  Medication Sig Dispense Refill  . bisacodyl (DULCOLAX) 5 MG EC tablet Take 5 mg by mouth daily as needed for moderate constipation.    . cholecalciferol (VITAMIN D) 1000 units tablet Take 1,000 Units by mouth daily.    [name] Kitchen levothyroxine (SYNTHROID, LEVOTHROID) 125 MCG tablet Take 125 mcg by mouth daily before breakfast.    . linaclotide (LINZESS) 290 MCG CAPS capsule Take 1 capsule (290 mcg total) by mouth daily before breakfast. 90 capsule 3   No current facility-administered medications  on file prior to visit.     PAST MEDICAL HISTORY: Past Medical History:  Diagnosis Date  . Constipation   . Graves disease   . Hyperlipidemia   . IBS (irritable bowel syndrome)   . Thyroid disease   . Vitamin D deficiency     PAST SURGICAL HISTORY: Past Surgical History:  Procedure  Laterality Date  . CESAREAN SECTION    . COLONOSCOPY      SOCIAL HISTORY: Social History   Tobacco Use  . Smoking status: Never Smoker  . Smokeless tobacco: Never Used  Substance Use Topics  . Alcohol use: Yes    Comment: occasional  . Drug use: No    FAMILY HISTORY: Family History  Problem Relation Age of Onset  . Diabetes Mother   . Cancer Mother        MMT Cancer  . Obesity Mother   . Throat cancer Father   . Sudden death Father   . Stroke Father     ROS: Review of Systems  Endo/Heme/Allergies: Negative for polydipsia.       Negative for hypoglycemia  Negative for polyuria    PHYSICAL EXAM: Blood pressure 119/78, pulse 90, temperature 98.2 F (36.8 C), temperature source Oral, height 5\' 4"  (1.626 m), weight 203 lb (92.1 kg), last menstrual period 10/13/2019, SpO2 96 %. Body mass index is 34.84 kg/m. Physical Exam Vitals signs reviewed.  Constitutional:      Appearance: Normal appearance. She is obese.  HENT:     Head: Normocephalic.     Nose: Nose normal.  Neck:     Musculoskeletal: Normal range of motion.  Cardiovascular:     Rate and Rhythm: Normal rate.  Pulmonary:     Effort: Pulmonary effort is normal.  Musculoskeletal: Normal range of motion.  Skin:    General: Skin is warm and dry.  Neurological:     Mental Status: She is alert and oriented to person, place, and time.  Psychiatric:        Mood and Affect: Mood normal.        Behavior: Behavior normal.     RECENT LABS AND TESTS: BMET No results found for: NA, K, CL, CO2, GLUCOSE, BUN, CREATININE, CALCIUM, GFRNONAA, GFRAA Lab Results  Component Value Date   HGBA1C 5.4 01/25/2019   Lab Results  Component Value Date   INSULIN 13.0 01/25/2019   INSULIN 9.4 10/28/2018   CBC No results found for: WBC, RBC, HGB, HCT, PLT, MCV, MCH, MCHC, RDW, LYMPHSABS, MONOABS, EOSABS, BASOSABS Iron/TIBC/Ferritin/ %Sat No results found for: IRON, TIBC, FERRITIN, IRONPCTSAT Lipid Panel  No results  found for: CHOL, TRIG, HDL, CHOLHDL, VLDL, LDLCALC, LDLDIRECT Hepatic Function Panel  No results found for: PROT, ALBUMIN, AST, ALT, ALKPHOS, BILITOT, BILIDIR, IBILI No results found for: TSH    OBESITY BEHAVIORAL INTERVENTION VISIT  Today's visit was # 24  Starting weight: 208 lbs Starting date: 10/28/18 Today's weight : Weight: 203 lb (92.1 kg)  Today's date: 10/27/2019 Total lbs lost to date: 0 At least 15 minutes were spent on discussing the following behavioral intervention visit.   ASK: We discussed the diagnosis of obesity with Susan Shields today and Susan Shields agreed to give 10/29/2019 permission to discuss obesity behavioral modification therapy today.  ASSESS: Susan Shields has the diagnosis of obesity and her BMI today is 34.83 Susan Shields is in the action stage of change   ADVISE: Susan Shields was educated on the multiple health risks of obesity as well as the benefit of weight  loss to improve her health. She was advised of the need for long term treatment and the importance of lifestyle modifications to improve her current health and to decrease her risk of future health problems.  AGREE: Multiple dietary modification options and treatment options were discussed and  Susan Shields agreed to follow the recommendations documented in the above note.  ARRANGE: Lonia FarberJavata was educated on the importance of frequent visits to treat obesity as outlined per CMS and USPSTF guidelines and agreed to schedule her next follow up appointment today.  I, Jeralene PetersAshleigh Haynes, am acting as transcriptionist for Quillian Quincearen Laveah Gloster, MD   I have reviewed the above documentation for accuracy and completeness, and I agree with the above. -Quillian Quincearen Autry Prust, MD

## 2019-11-03 MED FILL — VICTOZA 18 MG/3 ML INJECT P: 18 | 30 days supply | Qty: 6 | Fill #0

## 2019-11-17 ENCOUNTER — Other Ambulatory Visit: Payer: Self-pay

## 2019-11-17 ENCOUNTER — Encounter (INDEPENDENT_AMBULATORY_CARE_PROVIDER_SITE_OTHER): Payer: Self-pay | Admitting: Family Medicine

## 2019-11-17 ENCOUNTER — Ambulatory Visit (INDEPENDENT_AMBULATORY_CARE_PROVIDER_SITE_OTHER): Payer: 59 | Admitting: Family Medicine

## 2019-11-17 VITALS — BP 120/78 | HR 62 | Temp 97.6°F | Ht 64.0 in | Wt 201.0 lb

## 2019-11-17 DIAGNOSIS — R7303 Prediabetes: Secondary | ICD-10-CM | POA: Diagnosis not present

## 2019-11-17 DIAGNOSIS — E559 Vitamin D deficiency, unspecified: Secondary | ICD-10-CM

## 2019-11-17 DIAGNOSIS — Z9189 Other specified personal risk factors, not elsewhere classified: Secondary | ICD-10-CM | POA: Diagnosis not present

## 2019-11-17 DIAGNOSIS — E038 Other specified hypothyroidism: Secondary | ICD-10-CM | POA: Diagnosis not present

## 2019-11-17 DIAGNOSIS — E7849 Other hyperlipidemia: Secondary | ICD-10-CM | POA: Diagnosis not present

## 2019-11-17 DIAGNOSIS — E669 Obesity, unspecified: Secondary | ICD-10-CM

## 2019-11-17 DIAGNOSIS — Z6834 Body mass index (BMI) 34.0-34.9, adult: Secondary | ICD-10-CM

## 2019-11-17 MED ORDER — VICTOZA 18 MG/3ML ~~LOC~~ SOPN: 1.2000 mg | PEN_INJECTOR | SUBCUTANEOUS | 0 refills | Status: DC

## 2019-11-17 MED ORDER — BD PEN NEEDLE NANO 2ND GEN 32G X 4 MM MISC: 1.0000 | Freq: Two times a day (BID) | 0 refills | Status: DC

## 2019-11-18 LAB — LIPID PANEL WITH LDL/HDL RATIO
Cholesterol, Total: 243 mg/dL — ABNORMAL HIGH (ref 100–199)
HDL: 55 mg/dL (ref >39)
LDL Chol Calc (NIH): 166 mg/dL — ABNORMAL HIGH (ref 0–99)
LDL/HDL Ratio: 3 ratio (ref 0.0–3.2)
Triglycerides: 125 mg/dL (ref 0–149)
VLDL Cholesterol Cal: 22 mg/dL (ref 5–40)

## 2019-11-18 LAB — T4, FREE: Free T4: 1.61 ng/dL (ref 0.82–1.77)

## 2019-11-18 LAB — TSH: TSH: 0.51 u[IU]/mL (ref 0.450–4.500)

## 2019-11-18 LAB — VITAMIN D 25 HYDROXY (VIT D DEFICIENCY, FRACTURES): Vit D, 25-Hydroxy: 19 ng/mL — ABNORMAL LOW (ref 30.0–100.0)

## 2019-11-18 LAB — COMPREHENSIVE METABOLIC PANEL
ALT: 21 IU/L (ref 0–32)
AST: 21 IU/L (ref 0–40)
Albumin/Globulin Ratio: 1.5 (ref 1.2–2.2)
Albumin: 4.1 g/dL (ref 3.8–4.8)
Alkaline Phosphatase: 50 IU/L (ref 39–117)
BUN/Creatinine Ratio: 10 (ref 9–23)
BUN: 11 mg/dL (ref 6–20)
Bilirubin Total: 0.3 mg/dL (ref 0.0–1.2)
CO2: 21 mmol/L (ref 20–29)
Calcium: 9 mg/dL (ref 8.7–10.2)
Chloride: 105 mmol/L (ref 96–106)
Creatinine, Ser: 1.08 mg/dL — ABNORMAL HIGH (ref 0.57–1.00)
GFR calc Af Amer: 75 mL/min/{1.73_m2} (ref >59)
GFR calc non Af Amer: 65 mL/min/{1.73_m2} (ref >59)
Globulin, Total: 2.7 g/dL (ref 1.5–4.5)
Glucose: 78 mg/dL (ref 65–99)
Potassium: 3.9 mmol/L (ref 3.5–5.2)
Sodium: 140 mmol/L (ref 134–144)
Total Protein: 6.8 g/dL (ref 6.0–8.5)

## 2019-11-18 LAB — T3: T3, Total: 101 ng/dL (ref 71–180)

## 2019-11-18 LAB — INSULIN, RANDOM: INSULIN: 12 u[IU]/mL (ref 2.6–24.9)

## 2019-11-18 LAB — HEMOGLOBIN A1C
Est. average glucose Bld gHb Est-mCnc: 105 mg/dL
Hgb A1c MFr Bld: 5.3 % (ref 4.8–5.6)

## 2019-11-21 NOTE — Progress Notes (Signed)
Office: [phone]  /  Fax: [phone]   HPI:   Chief Complaint: OBESITY Susan Shields is here to discuss her progress with her obesity treatment plan. She is on the Category 3 plan and is following her eating plan approximately 90 % of the time. She states she is doing Zumba, walking and toning 45 minutes 5 times per week. Susan Shields is following the plan well, but she is bored with her food options.  Susan Shields feels Victoza has helped quite a bit with appetite. Her weight is 201 lb (91.2 kg) today and has had a weight loss of 2 pounds over a period of 3 weeks since her last visit. She has lost 7 lbs since starting treatment with [place].  Insulin Resistance Susan Shields has a diagnosis of insulin resistance based on her elevated fasting insulin level >5. Although Susan Shields's blood glucose readings are still under good control, insulin resistance puts her at greater risk of metabolic syndrome and diabetes. She is on Victoza 1.2 mg daily. She started Victoza one month ago and she feels it really helps with polyphagia. Susan Shields continues to work on diet and exercise to decrease risk of diabetes. Lab Results  Component Value Date   HGBA1C 5.3 11/17/2019   At risk for diabetes Susan Shields is at higher than average risk for developing diabetes due to her obesity and insulin resistance. She currently denies polyuria or polydipsia.  Hypothyroidism Susan Shields has a diagnosis of hypothyroidism. She is stable on 125 mcg of levothyroxine. She denies hot or cold intolerance or palpitations.  Hyperlipidemia Susan Shields has hyperlipidemia. She is not on a  statin. She had her last lipid panel done at [facility] but she does not have these results. She has been trying to improve her cholesterol levels with intensive lifestyle modification including a low saturated fat diet, exercise and weight loss. She denies any chest pain or shortness of breath.  Vitamin D deficiency Susan Shields has a diagnosis of vitamin D deficiency. She is currently taking OTC  vit D 1,000 IU daily. Susan Shields reports she had her vitamin D level done in July and it was 30. Susan Shields admits fatigue and she denies nausea, vomiting or muscle weakness.  ASSESSMENT AND PLAN:  Prediabetes - Plan: liraglutide (VICTOZA) 18 MG/3ML SOPN, Insulin Pen Needle (BD PEN NEEDLE NANO 2ND GEN) 32G X 4 MM MISC, Comprehensive Metabolic Panel (CMET), HgB A1c, Insulin, random  Other specified hypothyroidism - Plan: T3, T4, free, TSH  Other hyperlipidemia - Plan: Lipid Panel With LDL/HDL Ratio  Vitamin D deficiency - Plan: Vitamin D (25 hydroxy)  At risk for diabetes mellitus  Class 1 obesity with serious comorbidity and body mass index (BMI) of 34.0 to 34.9 in adult, unspecified obesity type  PLAN:  Insulin Resistance Susan Shields will continue to work on weight loss, exercise, and decreasing simple carbohydrates in her diet to help decrease the risk of diabetes. She was informed that eating too many simple carbohydrates or too many calories at one sitting increases the likelihood of GI side effects. Susan Shields agrees to continue Victoza 1.2 mg subQ daily #2 pens with no refills and nano needles #100 with no refills and follow up with [place] as directed to monitor her progress.  Diabetes risk counseling Susan Shields was given extended (15 minutes) diabetes prevention counseling today. She is 38 y.o. female and has risk factors for diabetes including obesity and insulin resistance. We discussed intensive lifestyle modifications today with an emphasis on weight loss as well as increasing exercise and decreasing simple carbohydrates in her diet.  Hypothyroidism Susan Shields was informed of the importance of good thyroid control to help with weight loss efforts. She was also informed that supertherapeutic thyroid levels are dangerous and will not improve weight loss results. We will check thyroid panel today.  Hyperlipidemia Susan Shields was informed of the American Heart Association Guidelines emphasizing intensive lifestyle  modifications as the first line treatment for hyperlipidemia. We discussed many lifestyle modifications today in depth, and Susan Shields will continue to work on decreasing saturated fats such as fatty red meat, butter and many fried foods. She will also increase vegetables and lean protein in her diet and continue to work on exercise and weight loss efforts.  Vitamin D Deficiency Susan Shields was informed that low vitamin D levels contributes to fatigue and are associated with obesity, breast, and colon cancer. Susan Shields will continue to take OTC Vit D @1 ,000 IU daily and she will follow up for routine testing of vitamin D, at least 2-3 times per year. She was informed of the risk of over-replacement of vitamin D and agrees to not increase her dose unless she discusses this with [place] first. We will check vitamin D level and Susan Shields will follow up as directed.  Obesity Susan Shields is currently in the action stage of change. As such, her goal is to continue with weight loss efforts She has agreed to follow the Category 3 plan or the lower carbohydrate, vegetable and lean protein rich diet plan  Susan Shields will continue her current exercise regimen for weight loss and overall health benefits. We discussed the following Behavioral Modification Strategies today: planning for success We discussed the low carb plan and she may try it.  Susan Shields has agreed to follow up with our clinic in 3 weeks. She was informed of the importance of frequent follow up visits to maximize her success with intensive lifestyle modifications for her multiple health conditions.  ALLERGIES: No Known Allergies  MEDICATIONS: Current Outpatient Medications on File Prior to Visit  Medication Sig Dispense Refill  . bisacodyl (DULCOLAX) 5 MG EC tablet Take 5 mg by mouth daily as needed for moderate constipation.    . cholecalciferol (VITAMIN D) 1000 units tablet Take 1,000 Units by mouth daily.    [name] Kitchen levothyroxine (SYNTHROID, LEVOTHROID) 125 MCG tablet Take  125 mcg by mouth daily before breakfast.    . linaclotide (LINZESS) 290 MCG CAPS capsule Take 1 capsule (290 mcg total) by mouth daily before breakfast. 90 capsule 3   No current facility-administered medications on file prior to visit.     PAST MEDICAL HISTORY: Past Medical History:  Diagnosis Date  . Constipation   . Graves disease   . Hyperlipidemia   . IBS (irritable bowel syndrome)   . Thyroid disease   . Vitamin D deficiency     PAST SURGICAL HISTORY: Past Surgical History:  Procedure Laterality Date  . CESAREAN SECTION    . COLONOSCOPY      SOCIAL HISTORY: Social History   Tobacco Use  . Smoking status: Never Smoker  . Smokeless tobacco: Never Used  Substance Use Topics  . Alcohol use: Yes    Comment: occasional  . Drug use: No    FAMILY HISTORY: Family History  Problem Relation Age of Onset  . Diabetes Mother   . Cancer Mother        MMT Cancer  . Obesity Mother   . Throat cancer Father   . Sudden death Father   . Stroke Father     ROS: Review of Systems  Constitutional: Positive  for malaise/fatigue and weight loss.  Respiratory: Negative for shortness of breath.   Cardiovascular: Negative for chest pain and palpitations.  Gastrointestinal: Negative for nausea and vomiting.  Genitourinary: Negative for frequency.  Musculoskeletal:       Negative for muscle weakness  Endo/Heme/Allergies: Negative for polydipsia.       Negative for polyphagia Negative for heat or cold intolerance    PHYSICAL EXAM: Blood pressure 120/78, pulse 62, temperature 97.6 F (36.4 C), temperature source Oral, height 5\' 4"  (1.626 m), weight 201 lb (91.2 kg), last menstrual period 11/14/2019, SpO2 100 %. Body mass index is 34.5 kg/m. Physical Exam Vitals signs reviewed.  Constitutional:      Appearance: Normal appearance. She is well-developed. She is obese.  Cardiovascular:     Rate and Rhythm: Normal rate.  Pulmonary:     Effort: Pulmonary effort is normal.   Musculoskeletal: Normal range of motion.  Skin:    General: Skin is warm and dry.  Neurological:     Mental Status: She is alert and oriented to person, place, and time.  Psychiatric:        Mood and Affect: Mood normal.        Behavior: Behavior normal.     RECENT LABS AND TESTS: BMET    Component Value Date/Time   NA 140 11/17/2019 0814   K 3.9 11/17/2019 0814   CL 105 11/17/2019 0814   CO2 21 11/17/2019 0814   GLUCOSE 78 11/17/2019 0814   BUN 11 11/17/2019 0814   CREATININE 1.08 (H) 11/17/2019 0814   CALCIUM 9.0 11/17/2019 0814   GFRNONAA 65 11/17/2019 0814   GFRAA 75 11/17/2019 0814   Lab Results  Component Value Date   HGBA1C 5.3 11/17/2019   HGBA1C 5.4 01/25/2019   Lab Results  Component Value Date   INSULIN 12.0 11/17/2019   INSULIN 13.0 01/25/2019   INSULIN 9.4 10/28/2018   CBC No results found for: WBC, RBC, HGB, HCT, PLT, MCV, MCH, MCHC, RDW, LYMPHSABS, MONOABS, EOSABS, BASOSABS Iron/TIBC/Ferritin/ %Sat No results found for: IRON, TIBC, FERRITIN, IRONPCTSAT Lipid Panel     Component Value Date/Time   CHOL 243 (H) 11/17/2019 0814   TRIG 125 11/17/2019 0814   HDL 55 11/17/2019 0814   LDLCALC 166 (H) 11/17/2019 0814   Hepatic Function Panel     Component Value Date/Time   PROT 6.8 11/17/2019 0814   ALBUMIN 4.1 11/17/2019 0814   AST 21 11/17/2019 0814   ALT 21 11/17/2019 0814   ALKPHOS 50 11/17/2019 0814   BILITOT 0.3 11/17/2019 0814      Component Value Date/Time   TSH 0.510 11/17/2019 0814     Ref. Range 11/17/2019 08:14  Vitamin D, 25-Hydroxy Latest Ref Range: 30.0 - 100.0 ng/mL 19.0 (L)    OBESITY BEHAVIORAL INTERVENTION VISIT  Today's visit was # 25  Starting weight: 208 lbs Starting date: 10/28/2018 Today's weight : 201 lbs Today's date: 11/17/2019 Total lbs lost to date: 7    11/17/2019  Height 5\' 4"  (1.626 m)  Weight 201 lb (91.2 kg)  BMI (Calculated) 34.48  BLOOD PRESSURE - SYSTOLIC 120  BLOOD PRESSURE - DIASTOLIC 78    Body Fat % 37.8 %  Total Body Water (lbs) 80.8 lbs    ASK: We discussed the diagnosis of obesity with Susan Shields today and Arsema agreed to give us permission to discuss obesity behavioral modification therapy today.  ASSESS: Susan Shields has the diagnosis of obesity and her BMI today is 34.48 Susan Shields  is in the action stage of change   ADVISE: Susan Shields was educated on the multiple health risks of obesity as well as the benefit of weight loss to improve her health. She was advised of the need for long term treatment and the importance of lifestyle modifications to improve her current health and to decrease her risk of future health problems.  AGREE: Multiple dietary modification options and treatment options were discussed and  Kyriana agreed to follow the recommendations documented in the above note.  ARRANGE: Deina was educated on the importance of frequent visits to treat obesity as outlined per CMS and USPSTF guidelines and agreed to schedule her next follow up appointment today.  I, [place] Crane, am acting as transcriptionist for [place], FNP-C  I have reviewed the above documentation for accuracy and completeness, and I agree with the above.  - Bode Pieper, FNP-C.

## 2019-11-22 MED FILL — UNIFINE PENTIPS 32GX5/32": 32G X 4 MM | 50 days supply | Qty: 100 | Fill #0

## 2019-11-26 DIAGNOSIS — R7303 Prediabetes: Secondary | ICD-10-CM | POA: Insufficient documentation

## 2019-11-26 DIAGNOSIS — E559 Vitamin D deficiency, unspecified: Secondary | ICD-10-CM | POA: Insufficient documentation

## 2019-11-30 DIAGNOSIS — E7800 Pure hypercholesterolemia, unspecified: Secondary | ICD-10-CM | POA: Diagnosis not present

## 2019-11-30 DIAGNOSIS — E890 Postprocedural hypothyroidism: Secondary | ICD-10-CM | POA: Diagnosis not present

## 2019-11-30 DIAGNOSIS — R7303 Prediabetes: Secondary | ICD-10-CM | POA: Diagnosis not present

## 2019-11-30 DIAGNOSIS — E559 Vitamin D deficiency, unspecified: Secondary | ICD-10-CM | POA: Diagnosis not present

## 2019-11-30 DIAGNOSIS — E6609 Other obesity due to excess calories: Secondary | ICD-10-CM | POA: Diagnosis not present

## 2019-12-05 MED FILL — VICTOZA 18 MG/3 ML INJECT P: 18 | 30 days supply | Qty: 6 | Fill #0

## 2019-12-08 ENCOUNTER — Ambulatory Visit (INDEPENDENT_AMBULATORY_CARE_PROVIDER_SITE_OTHER): Payer: 59 | Admitting: Family Medicine

## 2019-12-08 ENCOUNTER — Other Ambulatory Visit: Payer: Self-pay

## 2019-12-08 ENCOUNTER — Encounter (INDEPENDENT_AMBULATORY_CARE_PROVIDER_SITE_OTHER): Payer: Self-pay | Admitting: Family Medicine

## 2019-12-08 VITALS — BP 108/75 | HR 85 | Temp 98.7°F | Ht 64.0 in | Wt 204.0 lb

## 2019-12-08 DIAGNOSIS — E8881 Metabolic syndrome: Secondary | ICD-10-CM | POA: Diagnosis not present

## 2019-12-08 DIAGNOSIS — Z6835 Body mass index (BMI) 35.0-35.9, adult: Secondary | ICD-10-CM | POA: Diagnosis not present

## 2019-12-08 DIAGNOSIS — Z9189 Other specified personal risk factors, not elsewhere classified: Secondary | ICD-10-CM | POA: Diagnosis not present

## 2019-12-08 MED ORDER — VICTOZA 18 MG/3ML ~~LOC~~ SOPN: 1.2000 mg | PEN_INJECTOR | SUBCUTANEOUS | 0 refills | Status: DC

## 2019-12-12 NOTE — Progress Notes (Signed)
Office: [phone]  /  Fax: [phone]   HPI:  Chief Complaint: OBESITY Susan Shields is here to discuss her progress with her obesity treatment plan. She is on the lower carbohydrate, vegetable and lean protein rich diet plan or  the Category 3 plan and states she is following her eating plan approximately 85 % of the time. She states she is exercising 0 minutes 0 times per week.  Susan Shields has gotten off track on the plan and has gained a bit of weight, but she is ready to get back on track. Her hunger is controlled especially on liraglutide.  Today's visit was # 26  Starting weight: 208 lbs Starting date: 10/28/18 Today's weight : 204 lbs  Today's date: 12/08/2019 Total lbs lost to date: 4 Total lbs lost since last in-office visit: 0  Insulin Resistance Susan Shields has a diagnosis of insulin resistance. She is on Victoza at 1.2 mg and is tolerating this dose well. She denies nausea or vomiting and notes decreased polyphagia.  At risk for diabetes Susan Shields is at higher than average risk for developing diabetes due to her obesity and insulin resistance.   ASSESSMENT AND PLAN:  Insulin resistance - Plan: liraglutide (VICTOZA) 18 MG/3ML SOPN  At risk for diabetes mellitus  Class 2 severe obesity with serious comorbidity and body mass index (BMI) of 35.0 to 35.9 in adult, unspecified obesity type (HCC)  PLAN:  Insulin Resistance Susan Shields will continue to work on weight loss, exercise, and decreasing simple carbohydrates to help decrease the risk of diabetes. Susan Shields agrees to continue Victoza at 1.2 mg SubQ q AM #2 pens and we will refills for 1 month. Susan Shields agrees to follow up with our clinic in 4 weeks.  Diabetes risk counseling (~15 min) Susan Shields is a 38 y.o. female and has risk factors for diabetes including obesity and insulin resistance. We discussed intensive lifestyle modifications today with an emphasis on weight loss as well as increasing exercise and decreasing simple carbohydrates  in her diet.  Obesity Susan Shields is currently in the action stage of change. As such, her goal is to continue with weight loss efforts She has agreed to follow the Category 3 plan Susan Shields has been instructed to work up to a goal of 150 minutes of combined cardio and strengthening exercise per week for weight loss and overall health benefits. We discussed the following Behavioral Modification Strategies today: increasing lean protein intake and holiday eating strategies    Susan Shields has agreed to follow up with our clinic in 4 weeks. She was informed of the importance of frequent follow up visits to maximize her success with intensive lifestyle modifications for her multiple health conditions.  ALLERGIES: No Known Allergies  MEDICATIONS: Current Outpatient Medications on File Prior to Visit  Medication Sig Dispense Refill  . bisacodyl (DULCOLAX) 5 MG EC tablet Take 5 mg by mouth daily as needed for moderate constipation.    . cholecalciferol (VITAMIN D) 1000 units tablet Take 1,000 Units by mouth 4 (four) times daily.     . Insulin Pen Needle (BD PEN NEEDLE NANO 2ND GEN) 32G X 4 MM MISC 1 Package by Does not apply route 2 (two) times daily. 100 each 0  . levothyroxine (SYNTHROID, LEVOTHROID) 125 MCG tablet Take 125 mcg by mouth daily before breakfast.    . linaclotide (LINZESS) 290 MCG CAPS capsule Take 1 capsule (290 mcg total) by mouth daily before breakfast. 90 capsule 3   No current facility-administered medications on file prior to visit.  PAST MEDICAL HISTORY: Past Medical History:  Diagnosis Date  . Constipation   . Graves disease   . Hyperlipidemia   . IBS (irritable bowel syndrome)   . Thyroid disease   . Vitamin D deficiency     PAST SURGICAL HISTORY: Past Surgical History:  Procedure Laterality Date  . CESAREAN SECTION    . COLONOSCOPY      SOCIAL HISTORY: Social History   Tobacco Use  . Smoking status: Never Smoker  . Smokeless tobacco: Never Used  Substance  Use Topics  . Alcohol use: Yes    Comment: occasional  . Drug use: No    FAMILY HISTORY: Family History  Problem Relation Age of Onset  . Diabetes Mother   . Cancer Mother        MMT Cancer  . Obesity Mother   . Throat cancer Father   . Sudden death Father   . Stroke Father     ROS: Review of Systems  Constitutional: Negative for weight loss.  Gastrointestinal: Negative for nausea and vomiting.  Endo/Heme/Allergies:       Positive polyphagia    PHYSICAL EXAM: Blood pressure 108/75, pulse 85, temperature 98.7 F (37.1 C), temperature source Oral, height 5\' 4"  (1.626 m), weight 204 lb (92.5 kg), last menstrual period 11/14/2019, SpO2 100 %. Body mass index is 35.02 kg/m. Physical Exam Vitals reviewed.  Constitutional:      Appearance: Normal appearance. She is obese.  Cardiovascular:     Rate and Rhythm: Normal rate.     Pulses: Normal pulses.  Pulmonary:     Effort: Pulmonary effort is normal.     Breath sounds: Normal breath sounds.  Musculoskeletal:        General: Normal range of motion.  Skin:    General: Skin is warm and dry.  Neurological:     Mental Status: She is alert and oriented to person, place, and time.  Psychiatric:        Mood and Affect: Mood normal.        Behavior: Behavior normal.     RECENT LABS AND TESTS: BMET    Component Value Date/Time   NA 140 11/17/2019 0814   K 3.9 11/17/2019 0814   CL 105 11/17/2019 0814   CO2 21 11/17/2019 0814   GLUCOSE 78 11/17/2019 0814   BUN 11 11/17/2019 0814   CREATININE 1.08 (H) 11/17/2019 0814   CALCIUM 9.0 11/17/2019 0814   GFRNONAA 65 11/17/2019 0814   GFRAA 75 11/17/2019 0814   Lab Results  Component Value Date   HGBA1C 5.3 11/17/2019   HGBA1C 5.4 01/25/2019   Lab Results  Component Value Date   INSULIN 12.0 11/17/2019   INSULIN 13.0 01/25/2019   INSULIN 9.4 10/28/2018   CBC No results found for: WBC, RBC, HGB, HCT, PLT, MCV, MCH, MCHC, RDW, LYMPHSABS, MONOABS, EOSABS,  BASOSABS Iron/TIBC/Ferritin/ %Sat No results found for: IRON, TIBC, FERRITIN, IRONPCTSAT Lipid Panel     Component Value Date/Time   CHOL 243 (H) 11/17/2019 0814   TRIG 125 11/17/2019 0814   HDL 55 11/17/2019 0814   LDLCALC 166 (H) 11/17/2019 0814   Hepatic Function Panel     Component Value Date/Time   PROT 6.8 11/17/2019 0814   ALBUMIN 4.1 11/17/2019 0814   AST 21 11/17/2019 0814   ALT 21 11/17/2019 0814   ALKPHOS 50 11/17/2019 0814   BILITOT 0.3 11/17/2019 0814      Component Value Date/Time   TSH 0.510 11/17/2019 0623  I, Burt KnackSharon Martin, am acting as transcriptionist for Quillian Quincearen Yuridia Couts, MD I have reviewed the above documentation for accuracy and completeness, and I agree with the above. -Quillian Quincearen Jiaire Rosebrook, MD

## 2019-12-26 MED FILL — LEVOTHYROXINE 125 MCG TABLE: 125 | 90 days supply | Qty: 90 | Fill #0

## 2020-01-03 ENCOUNTER — Ambulatory Visit (INDEPENDENT_AMBULATORY_CARE_PROVIDER_SITE_OTHER): Payer: No Typology Code available for payment source | Admitting: Family Medicine

## 2020-01-03 ENCOUNTER — Encounter (INDEPENDENT_AMBULATORY_CARE_PROVIDER_SITE_OTHER): Payer: Self-pay | Admitting: Family Medicine

## 2020-01-03 ENCOUNTER — Other Ambulatory Visit: Payer: Self-pay

## 2020-01-03 VITALS — BP 123/84 | HR 101 | Temp 98.3°F | Ht 64.0 in | Wt 205.0 lb

## 2020-01-03 DIAGNOSIS — Z9189 Other specified personal risk factors, not elsewhere classified: Secondary | ICD-10-CM

## 2020-01-03 DIAGNOSIS — E559 Vitamin D deficiency, unspecified: Secondary | ICD-10-CM

## 2020-01-03 DIAGNOSIS — Z6835 Body mass index (BMI) 35.0-35.9, adult: Secondary | ICD-10-CM

## 2020-01-03 DIAGNOSIS — E8881 Metabolic syndrome: Secondary | ICD-10-CM | POA: Diagnosis not present

## 2020-01-05 NOTE — Progress Notes (Signed)
Chief Complaint: OBESITY Susan Shields is here to discuss her progress with her obesity treatment plan along with follow-up of her obesity related diagnoses. Susan Shields is on the Category 3 Plan and states she is following her eating plan approximately 95% of the time. Susan Shields states she is doing zumba and toning for 45 minutes 3 times per week.  Today's visit was #: 27 Starting weight: 208 lbs Starting date: 10/28/18 Today's weight: 205 lbs Today's date: 01/03/2020 Total lbs lost to date: 3 Total lbs lost since last in-office visit: 0  Interim History: Susan Shields did some celebration eating over the holidays, but did well minimizing weight gain. She is planning on celebrating her birthday, but she wants to do some damage control.   Subjective:   1. Insulin resistance Susan Shields is on Victoza and is working on diet and exercise. She notes some increase in PM polyphagia in the last 2 weeks.  2. Vitamin D deficiency Susan Shields is on Vit D OTC 2,000 IU BID. Her last labs was not at goal.  3. At risk for diabetes mellitus Susan Shields is at higher than average risk for developing diabetes due to her obesity and insulin resistance.   Assessment/Plan:   1. Insulin resistance [name] agrees to increase Victoza to 1.8 mg SubQ AM #3 pens with no refill (Patient is to increase dose by 5 clicks first).  2. Vitamin D deficiency [name] agrees to continue OTC Vit D as is, and we will recheck labs at her next visit.  3. At risk for diabetes mellitus Susan Shields is a 39 y.o. female and has risk factors for diabetes including obesity. We discussed intensive lifestyle modifications today with an emphasis on weight loss as well as increasing exercise and decreasing simple carbohydrates in her diet. (15 minutes)  4. Class 2 severe obesity with serious comorbidity and body mass index (BMI) of 35.0 to 35.9 in adult, unspecified obesity type (HCC) Susan Shields is currently in the action stage of change. As such, her goal is to  continue with weight loss efforts. She has agreed to Category 3 Plan.   We discussed the following exercise goals today: Susan Shields is to continue her current exercise as is.  We discussed the following behavioral modification strategies today: increasing lean protein intake, meal planning and cooking strategies and celebration eating strategies.  Susan Shields has agreed to follow-up with our clinic in 2 to 3 weeks. She was informed of the importance of frequent follow-up visits to maximize her success with intensive lifestyle modifications for her multiple health conditions.  Objective:   Blood pressure 123/84, pulse (!) 101, temperature 98.3 F (36.8 C), temperature source Oral, height 5\' 4"  (1.626 m), weight 205 lb (93 kg), last menstrual period 01/03/2020, SpO2 100 %. Body mass index is 35.19 kg/m.  General: Cooperative, alert, well developed, in no acute distress. HEENT: Conjunctivae and lids unremarkable. Neck: No thyromegaly.  Cardiovascular: Regular rhythm.  Lungs: Normal work of breathing. Extremities: No edema.  Neurologic: No focal deficits.   Lab Results  Component Value Date   CREATININE 1.08 (H) 11/17/2019   BUN 11 11/17/2019   NA 140 11/17/2019   K 3.9 11/17/2019   CL 105 11/17/2019   CO2 21 11/17/2019   Lab Results  Component Value Date   ALT 21 11/17/2019   AST 21 11/17/2019   ALKPHOS 50 11/17/2019   BILITOT 0.3 11/17/2019   Lab Results  Component Value Date   HGBA1C 5.3 11/17/2019   HGBA1C 5.4 01/25/2019  Lab Results  Component Value Date   INSULIN 12.0 11/17/2019   INSULIN 13.0 01/25/2019   INSULIN 9.4 10/28/2018   Lab Results  Component Value Date   TSH 0.510 11/17/2019   Lab Results  Component Value Date   CHOL 243 (H) 11/17/2019   HDL 55 11/17/2019   LDLCALC 166 (H) 11/17/2019   TRIG 125 11/17/2019   No results found for: WBC, HGB, HCT, MCV, PLT No results found for: IRON, TIBC, FERRITIN  Attestation Statements:   Reviewed by  clinician on day of visit: allergies, medications, problem list, medical history, surgical history, family history, social history and previous encounter notes.  This visit occurred during the SARS-CoV-2 public health emergency. Safety protocols were in place, including screening questions prior to the visit, additional usage of staff PPE, and extensive cleaning of exam room while observing appropriate contact time as indicated for disinfecting solutions. (CPT [id number])  I, Trixie Dredge, am acting as transcriptionist for Dennard Nip, MD.  I have reviewed the above documentation for accuracy and completeness, and I agree with the above. -  Dennard Nip, MD

## 2020-01-09 MED ORDER — VICTOZA 18 MG/3ML ~~LOC~~ SOPN: 1.2000 mg | PEN_INJECTOR | SUBCUTANEOUS | 0 refills | Status: DC

## 2020-01-09 MED FILL — VICTOZA 18 MG/3 ML INJECT P: 18 | 30 days supply | Qty: 6 | Fill #0

## 2020-01-24 MED FILL — LINZESS 290 MCG CAPSULE: 290 | 90 days supply | Qty: 90 | Fill #1

## 2020-01-25 ENCOUNTER — Other Ambulatory Visit: Payer: Self-pay

## 2020-01-25 ENCOUNTER — Ambulatory Visit (INDEPENDENT_AMBULATORY_CARE_PROVIDER_SITE_OTHER): Payer: No Typology Code available for payment source | Admitting: Family Medicine

## 2020-01-25 ENCOUNTER — Encounter (INDEPENDENT_AMBULATORY_CARE_PROVIDER_SITE_OTHER): Payer: Self-pay | Admitting: Family Medicine

## 2020-01-25 VITALS — BP 118/81 | HR 79 | Temp 98.0°F | Ht 64.0 in | Wt 203.0 lb

## 2020-01-25 DIAGNOSIS — E8881 Metabolic syndrome: Secondary | ICD-10-CM | POA: Diagnosis not present

## 2020-01-25 DIAGNOSIS — Z9189 Other specified personal risk factors, not elsewhere classified: Secondary | ICD-10-CM

## 2020-01-25 DIAGNOSIS — Z6834 Body mass index (BMI) 34.0-34.9, adult: Secondary | ICD-10-CM | POA: Diagnosis not present

## 2020-01-25 DIAGNOSIS — E669 Obesity, unspecified: Secondary | ICD-10-CM

## 2020-01-25 MED ORDER — LIRAGLUTIDE 18 MG/3ML ~~LOC~~ SOPN: 1.8000 mg | PEN_INJECTOR | Freq: Every morning | SUBCUTANEOUS | 0 refills | Status: DC

## 2020-01-25 NOTE — Progress Notes (Signed)
Chief Complaint:   OBESITY Susan Shields is here to discuss her progress with her obesity treatment plan along with follow-up of her obesity related diagnoses. Susan Shields is on the Category 3 Plan and states she is following her eating plan approximately 95% of the time. Susan Shields states she is doing zumba, toning, and walking for 45 minutes 3 times per week.  Today's visit was #: 28 Starting weight: 208 lbs Starting date: 10/28/18 Today's weight: 203 lbs Today's date: 01/25/2020 Total lbs lost to date: 5 Total lbs lost since last in-office visit: 2  Interim History: Susan Shields continues to do well with weight loss, and she has increased walking fro exercise. She is still struggling with hunger and was unable to increase her Victoza due to pharmacy error.  Subjective:   1. Insulin resistance Susan Shields didn't increase her dose due to pharmacy error and she was only given 2 pens last prescription. She continues to do well with weight loss.  Assessment/Plan:   1. Insulin resistance Susan Shields will continue to work on weight loss, exercise, and decreasing simple carbohydrates to help decrease the risk of diabetes. Susan Shields agreed to increase Victoza to 1.8 mg daily. Susan Shields agreed to follow-up with [place] as directed to closely monitor her progress.  - liraglutide (VICTOZA) 18 MG/3ML SOPN; Inject 0.3 mLs (1.8 mg total) into the skin every morning.  Dispense: 3 pen; Refill: 0  2. At risk for nausea Susan Shields was given approximately 15 minutes of nausea prevention counseling today. Susan Shields is at risk for nausea due to her increased Victoza dose. She was encouraged to titrate her medication slowly, make sure to stay hydrated, eat smaller portions throughout the day, and avoid high fat meals.   3. Class 1 obesity with serious comorbidity and body mass index (BMI) of 34.0 to 34.9 in adult, unspecified obesity type Susan Shields is currently in the action stage of change. As such, her goal is to continue with weight loss  efforts. She has agreed to the Category 3 Plan.   Exercise goals: Susan Shields will continue her current exercise regimen as is.  Behavioral modification strategies: increasing lean protein intake and decreasing simple carbohydrates.  Susan Shields has agreed to follow-up with our clinic in 2 to 3 weeks. She was informed of the importance of frequent follow-up visits to maximize her success with intensive lifestyle modifications for her multiple health conditions.   Objective:   Blood pressure 118/81, pulse 79, temperature 98 F (36.7 C), temperature source Oral, height 5\' 4"  (1.626 m), weight 203 lb (92.1 kg), last menstrual period 01/11/2020, SpO2 99 %. Body mass index is 34.84 kg/m.  General: Cooperative, alert, well developed, in no acute distress. HEENT: Conjunctivae and lids unremarkable. Cardiovascular: Regular rhythm.  Lungs: Normal work of breathing. Neurologic: No focal deficits.   Lab Results  Component Value Date   CREATININE 1.08 (H) 11/17/2019   BUN 11 11/17/2019   NA 140 11/17/2019   K 3.9 11/17/2019   CL 105 11/17/2019   CO2 21 11/17/2019   Lab Results  Component Value Date   ALT 21 11/17/2019   AST 21 11/17/2019   ALKPHOS 50 11/17/2019   BILITOT 0.3 11/17/2019   Lab Results  Component Value Date   HGBA1C 5.3 11/17/2019   HGBA1C 5.4 01/25/2019   Lab Results  Component Value Date   INSULIN 12.0 11/17/2019   INSULIN 13.0 01/25/2019   INSULIN 9.4 10/28/2018   Lab Results  Component Value Date   TSH 0.510 11/17/2019  Lab Results  Component Value Date   CHOL 243 (H) 11/17/2019   HDL 55 11/17/2019   LDLCALC 166 (H) 11/17/2019   TRIG 125 11/17/2019   No results found for: WBC, HGB, HCT, MCV, PLT No results found for: IRON, TIBC, FERRITIN  Attestation Statements:   Reviewed by clinician on day of visit: allergies, medications, problem list, medical history, surgical history, family history, social history, and previous encounter notes.   I, Burt Knack, am acting as transcriptionist for Quillian Quince, MD.  I have reviewed the above documentation for accuracy and completeness, and I agree with the above. -  Quillian Quince, MD

## 2020-01-30 MED FILL — diazePAM 5 MG TABS: 5 | 1 days supply | Qty: 2 | Fill #0

## 2020-02-02 MED FILL — VICTOZA 18 MG/3 ML INJECT P: 18 | 30 days supply | Qty: 9 | Fill #0

## 2020-02-22 ENCOUNTER — Other Ambulatory Visit: Payer: Self-pay

## 2020-02-22 ENCOUNTER — Encounter (INDEPENDENT_AMBULATORY_CARE_PROVIDER_SITE_OTHER): Payer: Self-pay | Admitting: Family Medicine

## 2020-02-22 ENCOUNTER — Ambulatory Visit (INDEPENDENT_AMBULATORY_CARE_PROVIDER_SITE_OTHER): Payer: No Typology Code available for payment source | Admitting: Family Medicine

## 2020-02-22 VITALS — BP 117/81 | HR 64 | Temp 98.5°F | Ht 64.0 in | Wt 205.0 lb

## 2020-02-22 DIAGNOSIS — E8881 Metabolic syndrome: Secondary | ICD-10-CM

## 2020-02-22 DIAGNOSIS — Z6835 Body mass index (BMI) 35.0-35.9, adult: Secondary | ICD-10-CM | POA: Diagnosis not present

## 2020-02-22 MED ORDER — LIRAGLUTIDE 18 MG/3ML ~~LOC~~ SOPN: 1.8000 mg | PEN_INJECTOR | Freq: Every morning | SUBCUTANEOUS | 0 refills | Status: DC

## 2020-02-22 NOTE — Progress Notes (Signed)
Chief Complaint:   OBESITY Susan Shields is here to discuss her progress with her obesity treatment plan along with follow-up of her obesity related diagnoses. Susan Shields is on the Category 3 Plan and states she is following her eating plan approximately 90% of the time. Susan Shields states she is doing zumba and toning for 45-60 minutes 3 times per week.  Today's visit was #: 29 Starting weight: 208 lbs Starting date: 10/28/18 Today's weight: 205 lbs Today's date: 02/22/2020 Total lbs lost to date: 3 Total lbs lost since last in-office visit: 0  Interim History: Susan Shields is retaining fluid today. She has actually lost approximately 2 lbs of fat since her last visit. She notes her hunger is mostly controlled, and she has decreased PM cravings. She is bored with lunch and would like extra options.  Subjective:   1. Insulin resistance Susan Shields is stable on Victoza and she denies hypoglycemia. She requests a refill today.  Assessment/Plan:   1. Insulin resistance Susan Shields will continue to work on weight loss, diet, exercise, and decreasing simple carbohydrates to help decrease the risk of diabetes. We will refill Victoza for 1 month. Susan Shields agreed to follow-up with [place] as directed to closely monitor her progress.  - liraglutide (VICTOZA) 18 MG/3ML SOPN; Inject 0.3 mLs (1.8 mg total) into the skin every morning.  Dispense: 3 pen; Refill: 0  2. Class 2 severe obesity with serious comorbidity and body mass index (BMI) of 35.0 to 35.9 in adult, unspecified obesity type (HCC) Susan Shields is currently in the action stage of change. As such, her goal is to continue with weight loss efforts. She has agreed to the Category 3 Plan and keeping a food journal and adhering to recommended goals of 400-500 calories and 35+ grams of protein at lunch daily.   Exercise goals: Susan Shields is to continue her current exercise regimen as is.  Behavioral modification strategies: increasing lean protein intake and meal planning and  cooking strategies.  Susan Shields has agreed to follow-up with our clinic in 3 weeks. She was informed of the importance of frequent follow-up visits to maximize her success with intensive lifestyle modifications for her multiple health conditions.   Objective:   Blood pressure 117/81, pulse 64, temperature 98.5 F (36.9 C), height 5\' 4"  (1.626 m), weight 205 lb (93 kg), SpO2 98 %. Body mass index is 35.19 kg/m.  General: Cooperative, alert, well developed, in no acute distress. HEENT: Conjunctivae and lids unremarkable. Cardiovascular: Regular rhythm.  Lungs: Normal work of breathing. Neurologic: No focal deficits.   Lab Results  Component Value Date   CREATININE 1.08 (H) 11/17/2019   BUN 11 11/17/2019   NA 140 11/17/2019   K 3.9 11/17/2019   CL 105 11/17/2019   CO2 21 11/17/2019   Lab Results  Component Value Date   ALT 21 11/17/2019   AST 21 11/17/2019   ALKPHOS 50 11/17/2019   BILITOT 0.3 11/17/2019   Lab Results  Component Value Date   HGBA1C 5.3 11/17/2019   HGBA1C 5.4 01/25/2019   Lab Results  Component Value Date   INSULIN 12.0 11/17/2019   INSULIN 13.0 01/25/2019   INSULIN 9.4 10/28/2018   Lab Results  Component Value Date   TSH 0.510 11/17/2019   Lab Results  Component Value Date   CHOL 243 (H) 11/17/2019   HDL 55 11/17/2019   LDLCALC 166 (H) 11/17/2019   TRIG 125 11/17/2019   No results found for: WBC, HGB, HCT, MCV, PLT No results found for: IRON,  TIBC, FERRITIN  Attestation Statements:   Reviewed by clinician on day of visit: allergies, medications, problem list, medical history, surgical history, family history, social history, and previous encounter notes.  Time spent on visit including pre-visit chart review and post-visit care was 30 minutes.    I, Trixie Dredge, am acting as transcriptionist for Dennard Nip, MD.  I have reviewed the above documentation for accuracy and completeness, and I agree with the above. -  Dennard Nip, MD

## 2020-02-29 ENCOUNTER — Other Ambulatory Visit (INDEPENDENT_AMBULATORY_CARE_PROVIDER_SITE_OTHER): Payer: Self-pay | Admitting: Family Medicine

## 2020-02-29 DIAGNOSIS — R7303 Prediabetes: Secondary | ICD-10-CM

## 2020-02-29 MED FILL — UNIFINE PENTIPS 32GX5/32": 32G X 4 MM | 50 days supply | Qty: 100 | Fill #0

## 2020-02-29 MED FILL — VICTOZA 18 MG/3 ML INJECT P: 18 | 30 days supply | Qty: 9 | Fill #0

## 2020-02-29 MED FILL — UNIFINE PENTIPS 32GX5/32: 32G X 4 MM | 50 days supply | Qty: 100 | Fill #0

## 2020-03-13 ENCOUNTER — Other Ambulatory Visit: Payer: Self-pay

## 2020-03-13 ENCOUNTER — Ambulatory Visit (INDEPENDENT_AMBULATORY_CARE_PROVIDER_SITE_OTHER): Payer: No Typology Code available for payment source | Admitting: Physician Assistant

## 2020-03-13 ENCOUNTER — Encounter (INDEPENDENT_AMBULATORY_CARE_PROVIDER_SITE_OTHER): Payer: Self-pay | Admitting: Physician Assistant

## 2020-03-13 VITALS — BP 117/74 | HR 94 | Temp 98.0°F | Ht 64.0 in | Wt 202.0 lb

## 2020-03-13 DIAGNOSIS — E669 Obesity, unspecified: Secondary | ICD-10-CM

## 2020-03-13 DIAGNOSIS — Z9189 Other specified personal risk factors, not elsewhere classified: Secondary | ICD-10-CM

## 2020-03-13 DIAGNOSIS — E7849 Other hyperlipidemia: Secondary | ICD-10-CM | POA: Diagnosis not present

## 2020-03-13 DIAGNOSIS — Z6834 Body mass index (BMI) 34.0-34.9, adult: Secondary | ICD-10-CM

## 2020-03-13 DIAGNOSIS — E8881 Metabolic syndrome: Secondary | ICD-10-CM

## 2020-03-13 MED ORDER — LIRAGLUTIDE 18 MG/3ML ~~LOC~~ SOPN: 1.8000 mg | PEN_INJECTOR | Freq: Every morning | SUBCUTANEOUS | 0 refills | Status: DC

## 2020-03-13 NOTE — Progress Notes (Signed)
Chief Complaint:   OBESITY Susan Shields is here to discuss her progress with her obesity treatment plan along with follow-up of her obesity related diagnoses. Susan Shields is on the Category 3 Plan and states she is following her eating plan approximately 95% of the time. Susan Shields states she is toning/Zumba/walking 45 minutes 4 times per week.  Today's visit was #: 50 Starting weight: 208 lbs Starting date: 10/28/2018 Today's weight: 202 lbs Today's date: 03/13/2020 Total lbs lost to date: 6 Total lbs lost since last in-office visit: 3  Interim History: Susan did a great job staying on track the last few weeks. She was able to be more creative with lunch and now would like to know her calorie and protein goals for dinner to be more creative with that meal as well.  Subjective:   Other hyperlipidemia. Susan Shields is on no medications. No chest pain. She is exercising regularly.   Lab Results  Component Value Date   CHOL 243 (H) 11/17/2019   HDL 55 11/17/2019   LDLCALC 166 (H) 11/17/2019   TRIG 125 11/17/2019   Lab Results  Component Value Date   ALT 21 11/17/2019   AST 21 11/17/2019   ALKPHOS 50 11/17/2019   BILITOT 0.3 11/17/2019   The 10-year ASCVD risk score Mikey Bussing DC Jr., et al., 2013) is: 0.4%   Values used to calculate the score:     Age: 39 years     Sex: Female     Is Non-Hispanic African American: Yes     Diabetic: No     Tobacco smoker: No     Systolic Blood Pressure: 188 mmHg     Is BP treated: No     HDL Cholesterol: 55 mg/dL     Total Cholesterol: 243 mg/dL  Insulin resistance. Susan Shields has a diagnosis of insulin resistance based on her elevated fasting insulin level >5. She continues to work on diet and exercise to decrease her risk of diabetes. No polyphagia. Susan Shields is on Victoza. No nausea, vomiting, or diarrhea.  Lab Results  Component Value Date   INSULIN 12.0 11/17/2019   INSULIN 13.0 01/25/2019   INSULIN 9.4 10/28/2018   Lab Results  Component Value Date     HGBA1C 5.3 11/17/2019   At risk for heart disease. Susan Shields is at a higher than average risk for cardiovascular disease due to obesity. Reviewed: no chest pain on exertion, no dyspnea on exertion, and no swelling of ankles.  Assessment/Plan:   Other hyperlipidemia. Cardiovascular risk and specific lipid/LDL goals reviewed.  We discussed several lifestyle modifications today and Susan Shields will continue to work on diet, exercise and weight loss efforts. Orders and follow up as documented in patient record.   Counseling Intensive lifestyle modifications are the first line treatment for this issue.  Dietary changes: Increase soluble fiber. Decrease simple carbohydrates.  Exercise changes: Moderate to vigorous-intensity aerobic activity 150 minutes per week if tolerated.  Lipid-lowering medications: see documented in medical record.  Insulin resistance. Susan Shields will continue to work on weight loss, exercise, and decreasing simple carbohydrates to help decrease the risk of diabetes. Susan Shields agreed to follow-up with [place] as directed to closely monitor her progress. Susan Shields was given a refill on her liraglutide (VICTOZA) 18 MG/3ML SOPN #3 with 0 refills.  At risk for heart disease. Susan Shields was given approximately 15 minutes of coronary artery disease prevention counseling today. She is 39 y.o. female and has risk factors for heart disease including obesity. We discussed intensive lifestyle  modifications today with an emphasis on specific weight loss instructions and strategies.   Repetitive spaced learning was employed today to elicit superior memory formation and behavioral change.  Class 1 obesity with serious comorbidity and body mass index (BMI) of 34.0 to 34.9 in adult, unspecified obesity type.  Susan Shields is currently in the action stage of change. As such, her goal is to continue with weight loss efforts. She has agreed to the Category 3 Plan.   Exercise goals: For substantial health benefits, adults  should do at least 150 minutes (2 hours and 30 minutes) a week of moderate-intensity, or 75 minutes (1 hour and 15 minutes) a week of vigorous-intensity aerobic physical activity, or an equivalent combination of moderate- and vigorous-intensity aerobic activity. Aerobic activity should be performed in episodes of at least 10 minutes, and preferably, it should be spread throughout the week.  Behavioral modification strategies: meal planning and cooking strategies and keeping healthy foods in the home.  Susan Shields has agreed to follow-up with our clinic in 4 weeks. She was informed of the importance of frequent follow-up visits to maximize her success with intensive lifestyle modifications for her multiple health conditions.   Objective:   Blood pressure 117/74, pulse 94, temperature 98 F (36.7 C), height 5\' 4"  (1.626 m), weight 202 lb (91.6 kg), SpO2 99 %. Body mass index is 34.67 kg/m.  General: Cooperative, alert, well developed, in no acute distress. HEENT: Conjunctivae and lids unremarkable. Cardiovascular: Regular rhythm.  Lungs: Normal work of breathing. Neurologic: No focal deficits.   Lab Results  Component Value Date   CREATININE 1.08 (H) 11/17/2019   BUN 11 11/17/2019   NA 140 11/17/2019   K 3.9 11/17/2019   CL 105 11/17/2019   CO2 21 11/17/2019   Lab Results  Component Value Date   ALT 21 11/17/2019   AST 21 11/17/2019   ALKPHOS 50 11/17/2019   BILITOT 0.3 11/17/2019   Lab Results  Component Value Date   HGBA1C 5.3 11/17/2019   HGBA1C 5.4 01/25/2019   Lab Results  Component Value Date   INSULIN 12.0 11/17/2019   INSULIN 13.0 01/25/2019   INSULIN 9.4 10/28/2018   Lab Results  Component Value Date   TSH 0.510 11/17/2019   Lab Results  Component Value Date   CHOL 243 (H) 11/17/2019   HDL 55 11/17/2019   LDLCALC 166 (H) 11/17/2019   TRIG 125 11/17/2019   No results found for: WBC, HGB, HCT, MCV, PLT No results found for: IRON, TIBC, FERRITIN  Attestation  Statements:   Reviewed by clinician on day of visit: allergies, medications, problem list, medical history, surgical history, family history, social history, and previous encounter notes.  I11/21/2020, am acting as transcriptionist for Marianna Payment, PA-C   I have reviewed the above documentation for accuracy and completeness, and I agree with the above. Alois Cliche, PA-C

## 2020-03-27 MED FILL — LEVOTHYROXINE 125 MCG TABLE: 125 | 90 days supply | Qty: 90 | Fill #1

## 2020-04-02 ENCOUNTER — Other Ambulatory Visit (INDEPENDENT_AMBULATORY_CARE_PROVIDER_SITE_OTHER): Payer: Self-pay | Admitting: Physician Assistant

## 2020-04-02 DIAGNOSIS — E8881 Metabolic syndrome: Secondary | ICD-10-CM

## 2020-04-03 MED FILL — VICTOZA 18 MG/3 ML INJECT P: 18 | 30 days supply | Qty: 9 | Fill #0

## 2020-04-16 ENCOUNTER — Other Ambulatory Visit: Payer: Self-pay

## 2020-04-16 ENCOUNTER — Encounter (INDEPENDENT_AMBULATORY_CARE_PROVIDER_SITE_OTHER): Payer: Self-pay | Admitting: Family Medicine

## 2020-04-16 ENCOUNTER — Ambulatory Visit (INDEPENDENT_AMBULATORY_CARE_PROVIDER_SITE_OTHER): Payer: No Typology Code available for payment source | Admitting: Family Medicine

## 2020-04-16 VITALS — BP 106/74 | HR 69 | Temp 97.8°F | Ht 64.0 in | Wt 205.0 lb

## 2020-04-16 DIAGNOSIS — Z9189 Other specified personal risk factors, not elsewhere classified: Secondary | ICD-10-CM

## 2020-04-16 DIAGNOSIS — E8881 Metabolic syndrome: Secondary | ICD-10-CM | POA: Diagnosis not present

## 2020-04-16 DIAGNOSIS — E559 Vitamin D deficiency, unspecified: Secondary | ICD-10-CM | POA: Diagnosis not present

## 2020-04-16 DIAGNOSIS — Z6835 Body mass index (BMI) 35.0-35.9, adult: Secondary | ICD-10-CM

## 2020-04-16 DIAGNOSIS — E119 Type 2 diabetes mellitus without complications: Secondary | ICD-10-CM

## 2020-04-16 MED ORDER — LIRAGLUTIDE 18 MG/3ML ~~LOC~~ SOPN: 1.8000 mg | PEN_INJECTOR | Freq: Every morning | SUBCUTANEOUS | 0 refills | Status: DC

## 2020-04-16 NOTE — Progress Notes (Addendum)
Chief Complaint:   OBESITY Susan Shields is here to discuss her progress with her obesity treatment plan along with follow-up of her obesity related diagnoses. Susan Shields is on the Category 3 Plan and states she is following her eating plan approximately 90% of the time. Susan Shields states she is walking for 40 minutes 5 times per week.  Today's visit was #: 58 Starting weight: 208 lbs Starting date: 10/28/2018 Today's weight: 205 lbs Today's date: 04/16/2020 Total lbs lost to date: 3 Total lbs lost since last in-office visit: 0  Interim History: Susan Shields is now working a second job, and meal planning has gotten more difficult for her and her family. She did well with journaling lunch in the past.  Subjective:   1.  Insulin Resistance Susan Shields is stable on Victoza. She denies nausea, vomiting, or hypoglycemia. She is struggling with decreasing simple carbohydrate in her diet.  2. Vitamin D deficiency Susan Shields is stable on Vit D, but her level is not yet at goal.  3. At risk for impaired metabolic function Susan Shields is at increased risk for impaired metabolic function due to risk of decreased protein intake.  Assessment/Plan:   1. Insulin Resistance Good blood sugar control is important to decrease the likelihood of diabetic complications such as nephropathy, neuropathy, limb loss, blindness, coronary artery disease, and death. Intensive lifestyle modification including diet, exercise and weight loss are the first line of treatment for diabetes. We will refill Victoza at 1.8 mg for 1 month.  - liraglutide (VICTOZA) 18 MG/3ML SOPN; Inject 0.3 mLs (1.8 mg total) into the skin every morning.  Dispense: 3 pen; Refill: 0  2. Vitamin D deficiency Low Vitamin D level contributes to fatigue and are associated with obesity, breast, and colon cancer. Susan Shields agreed to continue taking Vitamin D and will follow-up for routine testing of Vitamin D, at least 2-3 times per year to avoid over-replacement.  3. At  risk for impaired metabolic function Susan Shields was given approximately 15 minutes of impaired  metabolic function prevention counseling today. We discussed intensive lifestyle modifications today with an emphasis on specific nutrition and exercise instructions and strategies.   Repetitive spaced learning was employed today to elicit superior memory formation and behavioral change.  4. Class 2 severe obesity with serious comorbidity and body mass index (BMI) of 35.0 to 35.9 in adult, unspecified obesity type (HCC) Susan Shields is currently in the action stage of change. As such, her goal is to continue with weight loss efforts. She has agreed to change to keeping a food journal and adhering to recommended goals of 1200-1500 calories and 80 grams of protein daily.   Exercise goals: As is.  Behavioral modification strategies: increasing lean protein intake, better snacking choices, planning for success and keeping a strict food journal.  Susan Shields has agreed to follow-up with our clinic in 3 to 4 weeks. She was informed of the importance of frequent follow-up visits to maximize her success with intensive lifestyle modifications for her multiple health conditions.   Objective:   Blood pressure 106/74, pulse 69, temperature 97.8 F (36.6 C), temperature source Oral, height 5\' 4"  (1.626 m), weight 205 lb (93 kg), last menstrual period 04/04/2020, SpO2 97 %. Body mass index is 35.19 kg/m.  General: Cooperative, alert, well developed, in no acute distress. HEENT: Conjunctivae and lids unremarkable. Cardiovascular: Regular rhythm.  Lungs: Normal work of breathing. Neurologic: No focal deficits.   Lab Results  Component Value Date   CREATININE 1.08 (H) 11/17/2019   BUN 11  11/17/2019   NA 140 11/17/2019   K 3.9 11/17/2019   CL 105 11/17/2019   CO2 21 11/17/2019   Lab Results  Component Value Date   ALT 21 11/17/2019   AST 21 11/17/2019   ALKPHOS 50 11/17/2019   BILITOT 0.3 11/17/2019   Lab  Results  Component Value Date   HGBA1C 5.3 11/17/2019   HGBA1C 5.4 01/25/2019   Lab Results  Component Value Date   INSULIN 12.0 11/17/2019   INSULIN 13.0 01/25/2019   INSULIN 9.4 10/28/2018   Lab Results  Component Value Date   TSH 0.510 11/17/2019   Lab Results  Component Value Date   CHOL 243 (H) 11/17/2019   HDL 55 11/17/2019   LDLCALC 166 (H) 11/17/2019   TRIG 125 11/17/2019   No results found for: WBC, HGB, HCT, MCV, PLT No results found for: IRON, TIBC, FERRITIN  Attestation Statements:   Reviewed by clinician on day of visit: allergies, medications, problem list, medical history, surgical history, family history, social history, and previous encounter notes.   I, Burt Knack, am acting as transcriptionist for Quillian Quince, MD.  I have reviewed the above documentation for accuracy and completeness, and I agree with the above. -  Quillian Quince, MD

## 2020-04-23 MED FILL — LINZESS 290 MCG CAPSULE: 290 | 90 days supply | Qty: 90 | Fill #2

## 2020-05-07 ENCOUNTER — Ambulatory Visit (INDEPENDENT_AMBULATORY_CARE_PROVIDER_SITE_OTHER): Payer: No Typology Code available for payment source | Admitting: Family Medicine

## 2020-05-07 ENCOUNTER — Encounter (INDEPENDENT_AMBULATORY_CARE_PROVIDER_SITE_OTHER): Payer: Self-pay | Admitting: Family Medicine

## 2020-05-07 ENCOUNTER — Other Ambulatory Visit: Payer: Self-pay

## 2020-05-07 VITALS — BP 118/78 | HR 58 | Temp 97.7°F | Ht 64.0 in | Wt 204.0 lb

## 2020-05-07 DIAGNOSIS — E559 Vitamin D deficiency, unspecified: Secondary | ICD-10-CM

## 2020-05-07 DIAGNOSIS — E8881 Metabolic syndrome: Secondary | ICD-10-CM | POA: Diagnosis not present

## 2020-05-07 DIAGNOSIS — Z6835 Body mass index (BMI) 35.0-35.9, adult: Secondary | ICD-10-CM

## 2020-05-07 DIAGNOSIS — K5901 Slow transit constipation: Secondary | ICD-10-CM | POA: Diagnosis not present

## 2020-05-07 DIAGNOSIS — Z9189 Other specified personal risk factors, not elsewhere classified: Secondary | ICD-10-CM

## 2020-05-07 NOTE — Progress Notes (Signed)
Chief Complaint:   OBESITY Susan Shields is here to discuss her progress with her obesity treatment plan along with follow-up of her obesity related diagnoses. Susan Shields is on keeping a food journal and adhering to recommended goals of 1200-1500 calories and 80 grams of protein daily and states she is following her eating plan approximately 90% of the time. Susan Shields states she is doing 0 minutes 0 times per week.  Today's visit was #: 32 Starting weight: 208 lbs Starting date: 10/28/2018 Today's weight: 204 lbs Today's date: 05/07/2020 Total lbs lost to date: 4 Total lbs lost since last in-office visit: 1  Interim History: Susan Shields is using FitBit to journal her meal plan. She reports excellent water intake at work, however she will struggle with drinking enough when she is off at home.  Subjective:   1. Insulin resistance Susan Shields is tolerating liraglutide 1.8 mg and she denies GI upset.  2. Slow transit constipation Susan Shields will have daily BMs if she takes Linaclotide 290 mcg q daily and Bisacodyl 5 mg q daily. She is followed by Dr. Russella Dar, GI.  3. Vitamin D deficiency Susan Shields's Vit D level on 11/196/2020 was 19.0. She is on OTC Vit D 1,000 IU four times daily.  4. At risk for osteoporosis Susan Shields is at higher risk of osteopenia and osteoporosis due to Vitamin D deficiency and obesity.   Assessment/Plan:   1. Insulin resistance Susan Shields will continue her journaling meal plan, and will continue to work on weight loss, exercise, and decreasing simple carbohydrates to help decrease the risk of diabetes. We will check labs today. Susan Shields agreed to follow-up with [place] as directed to closely monitor her progress.  - CBC with Differential/Platelet - Comprehensive metabolic panel - Folate - Hemoglobin A1c - Insulin, random - Lipid Panel With LDL/HDL Ratio - T3 - T4, free - TSH - Vitamin B12  2. Slow transit constipation Susan Shields was informed that a decrease in bowel movement frequency is normal  while losing weight, but stools should not be hard or painful. Susan Shields will continuewith her current prescription regimen. She is to remain well hydrated, and will continue to follow up with GI as directed. Orders and follow up as documented in patient record.   3. Vitamin D deficiency Low Vitamin D level contributes to fatigue and are associated with obesity, breast, and colon cancer. Susan Shields agreed to continue taking OTC Vit D and will follow-up for routine testing of Vitamin D, at least 2-3 times per year to avoid over-replacement. We will check labs today.  - VITAMIN D 25 Hydroxy (Vit-D Deficiency, Fractures)  4. At risk for osteoporosis Susan Shields was given approximately 15 minutes of osteoporosis prevention counseling today. Susan Shields is at risk for osteopenia and osteoporosis due to her Vitamin D deficiency. She was encouraged to take her Vitamin D and follow her higher calcium diet and increase strengthening exercise to help strengthen her bones and decrease her risk of osteopenia and osteoporosis.  Susan Shields was employed today to elicit superior memory formation and behavioral change.  5. Class 2 severe obesity with serious comorbidity and body mass index (BMI) of 35.0 to 35.9 in adult, unspecified obesity type (HCC) Susan Shields is currently in the action stage of change. As such, her goal is to continue with weight loss efforts. She has agreed to keeping a food journal and adhering to recommended goals of 1200-1500 calories and 80+ grams of protein daily.   Exercise goals: No exercise has been prescribed at this time.  Behavioral  modification strategies: increasing lean protein intake, increasing water intake and no skipping meals.  Susan Shields has agreed to follow-up with our clinic in 3 to 4 weeks. She was informed of the importance of frequent follow-up visits to maximize her success with intensive lifestyle modifications for her multiple health conditions.   Susan Shields was informed we  would discuss her lab results at her next visit unless there is a critical issue that needs to be addressed sooner. Susan Shields agreed to keep her next visit at the agreed upon time to discuss these results.  Objective:   Blood pressure 118/78, pulse (!) 58, temperature 97.7 F (36.5 C), temperature source Oral, height 5\' 4"  (1.626 m), weight 204 lb (92.5 kg), last menstrual period 05/05/2020, SpO2 100 %. Body mass index is 35.02 kg/m.  General: Cooperative, alert, well developed, in no acute distress. HEENT: Conjunctivae and lids unremarkable. Cardiovascular: Regular rhythm.  Lungs: Normal work of breathing. Neurologic: No focal deficits.   Lab Results  Component Value Date   CREATININE 1.08 (H) 11/17/2019   BUN 11 11/17/2019   NA 140 11/17/2019   K 3.9 11/17/2019   CL 105 11/17/2019   CO2 21 11/17/2019   Lab Results  Component Value Date   ALT 21 11/17/2019   AST 21 11/17/2019   ALKPHOS 50 11/17/2019   BILITOT 0.3 11/17/2019   Lab Results  Component Value Date   HGBA1C 5.3 11/17/2019   HGBA1C 5.4 01/25/2019   Lab Results  Component Value Date   INSULIN 12.0 11/17/2019   INSULIN 13.0 01/25/2019   INSULIN 9.4 10/28/2018   Lab Results  Component Value Date   TSH 0.510 11/17/2019   Lab Results  Component Value Date   CHOL 243 (H) 11/17/2019   HDL 55 11/17/2019   LDLCALC 166 (H) 11/17/2019   TRIG 125 11/17/2019   No results found for: WBC, HGB, HCT, MCV, PLT No results found for: IRON, TIBC, FERRITIN  Attestation Statements:   Reviewed by clinician on day of visit: allergies, medications, problem list, medical history, surgical history, family history, social history, and previous encounter notes.   I, Trixie Dredge, am acting as transcriptionist for Dennard Nip, MD.  I have reviewed the above documentation for accuracy and completeness, and I agree with the above. -  Dennard Nip, MD

## 2020-05-08 LAB — TSH: TSH: 3.65 u[IU]/mL (ref 0.450–4.500)

## 2020-05-08 LAB — LIPID PANEL WITH LDL/HDL RATIO
Cholesterol, Total: 250 mg/dL — ABNORMAL HIGH (ref 100–199)
HDL: 61 mg/dL
LDL Chol Calc (NIH): 165 mg/dL — ABNORMAL HIGH (ref 0–99)
LDL/HDL Ratio: 2.7 ratio (ref 0.0–3.2)
Triglycerides: 134 mg/dL (ref 0–149)
VLDL Cholesterol Cal: 24 mg/dL (ref 5–40)

## 2020-05-08 LAB — HEMOGLOBIN A1C
Est. average glucose Bld gHb Est-mCnc: 103 mg/dL
Hgb A1c MFr Bld: 5.2 % (ref 4.8–5.6)

## 2020-05-08 LAB — CBC WITH DIFFERENTIAL/PLATELET
Basophils Absolute: 0 x10E3/uL (ref 0.0–0.2)
Basos: 0 %
EOS (ABSOLUTE): 0.2 x10E3/uL (ref 0.0–0.4)
Eos: 3 %
Hematocrit: 42.2 % (ref 34.0–46.6)
Hemoglobin: 13.3 g/dL (ref 11.1–15.9)
Immature Grans (Abs): 0 x10E3/uL (ref 0.0–0.1)
Immature Granulocytes: 0 %
Lymphocytes Absolute: 2.2 x10E3/uL (ref 0.7–3.1)
Lymphs: 35 %
MCH: 27 pg (ref 26.6–33.0)
MCHC: 31.5 g/dL (ref 31.5–35.7)
MCV: 86 fL (ref 79–97)
Monocytes Absolute: 0.4 x10E3/uL (ref 0.1–0.9)
Monocytes: 6 %
Neutrophils Absolute: 3.6 x10E3/uL (ref 1.4–7.0)
Neutrophils: 56 %
Platelets: 240 x10E3/uL (ref 150–450)
RBC: 4.92 x10E6/uL (ref 3.77–5.28)
RDW: 15.7 % — ABNORMAL HIGH (ref 11.7–15.4)
WBC: 6.4 x10E3/uL (ref 3.4–10.8)

## 2020-05-08 LAB — COMPREHENSIVE METABOLIC PANEL WITH GFR
ALT: 17 IU/L (ref 0–32)
AST: 22 IU/L (ref 0–40)
Albumin/Globulin Ratio: 1.4 (ref 1.2–2.2)
Albumin: 4.1 g/dL (ref 3.8–4.8)
Alkaline Phosphatase: 51 IU/L (ref 39–117)
BUN/Creatinine Ratio: 7 — ABNORMAL LOW (ref 9–23)
BUN: 7 mg/dL (ref 6–24)
Bilirubin Total: 0.2 mg/dL (ref 0.0–1.2)
CO2: 22 mmol/L (ref 20–29)
Calcium: 9.2 mg/dL (ref 8.7–10.2)
Chloride: 105 mmol/L (ref 96–106)
Creatinine, Ser: 1.07 mg/dL — ABNORMAL HIGH (ref 0.57–1.00)
GFR calc Af Amer: 75 mL/min/1.73
GFR calc non Af Amer: 65 mL/min/1.73
Globulin, Total: 3 g/dL (ref 1.5–4.5)
Glucose: 77 mg/dL (ref 65–99)
Potassium: 4.1 mmol/L (ref 3.5–5.2)
Sodium: 142 mmol/L (ref 134–144)
Total Protein: 7.1 g/dL (ref 6.0–8.5)

## 2020-05-08 LAB — VITAMIN B12: Vitamin B-12: 397 pg/mL (ref 232–1245)

## 2020-05-08 LAB — FOLATE: Folate: 14.9 ng/mL

## 2020-05-08 LAB — T4, FREE: Free T4: 1.26 ng/dL (ref 0.82–1.77)

## 2020-05-08 LAB — VITAMIN D 25 HYDROXY (VIT D DEFICIENCY, FRACTURES): Vit D, 25-Hydroxy: 34.7 ng/mL (ref 30.0–100.0)

## 2020-05-08 LAB — T3: T3, Total: 75 ng/dL (ref 71–180)

## 2020-05-08 LAB — INSULIN, RANDOM: INSULIN: 12.7 u[IU]/mL (ref 2.6–24.9)

## 2020-05-21 ENCOUNTER — Other Ambulatory Visit (HOSPITAL_COMMUNITY): Payer: Self-pay | Admitting: Family Medicine

## 2020-05-21 MED FILL — SUMATRIPTAN SUCC 100 MG TAB: 100 | 30 days supply | Qty: 9 | Fill #0

## 2020-05-21 MED FILL — VIT D2 1.25 MG (50,000 UNIT: 1.25 MG | 84 days supply | Qty: 12 | Fill #0

## 2020-05-25 MED FILL — ONDANSETRON HCL 4 MG TABS: 4 | 3 days supply | Qty: 10 | Fill #0

## 2020-06-04 ENCOUNTER — Encounter (INDEPENDENT_AMBULATORY_CARE_PROVIDER_SITE_OTHER): Payer: Self-pay | Admitting: Family Medicine

## 2020-06-04 ENCOUNTER — Other Ambulatory Visit (INDEPENDENT_AMBULATORY_CARE_PROVIDER_SITE_OTHER): Payer: Self-pay | Admitting: Family Medicine

## 2020-06-04 ENCOUNTER — Ambulatory Visit (INDEPENDENT_AMBULATORY_CARE_PROVIDER_SITE_OTHER): Payer: No Typology Code available for payment source | Admitting: Family Medicine

## 2020-06-04 ENCOUNTER — Other Ambulatory Visit: Payer: Self-pay

## 2020-06-04 VITALS — BP 117/73 | HR 84 | Temp 98.0°F | Ht 64.0 in | Wt 206.0 lb

## 2020-06-04 DIAGNOSIS — Z6835 Body mass index (BMI) 35.0-35.9, adult: Secondary | ICD-10-CM

## 2020-06-04 DIAGNOSIS — R7303 Prediabetes: Secondary | ICD-10-CM

## 2020-06-04 DIAGNOSIS — Z9189 Other specified personal risk factors, not elsewhere classified: Secondary | ICD-10-CM

## 2020-06-04 MED ORDER — LIRAGLUTIDE 18 MG/3ML ~~LOC~~ SOPN: 1.8000 mg | PEN_INJECTOR | Freq: Every morning | SUBCUTANEOUS | 0 refills | Status: DC

## 2020-06-04 NOTE — Progress Notes (Signed)
Chief Complaint:   OBESITY Susan Shields is here to discuss her progress with her obesity treatment plan along with follow-up of her obesity related diagnoses. Susan Shields is on keeping a food journal and adhering to recommended goals of 1200-1500 calories and 80+ grams of protein daily and states she is following her eating plan approximately 90% of the time. Susan Shields states she is doing 0 minutes 0 times per week.  Today's visit was #: 76 Starting weight: 208 lbs Starting date: 10/28/2018 Today's weight: 206 lbs Today's date: 06/04/2020 Total lbs lost to date: 2 Total lbs lost since last in-office visit: 0  Interim History: Susan Shields has been frustrated with herself and with her weight loss efforts. She is wondering if she should take a short break for a while. She is still trying to journal at this time.  Subjective:   1. Pre-diabetes Susan Shields is tolerating Victoza well and she denies nausea or vomiting, she is not sure if it is decreased polyphagia. She is struggling with weight loss and decreasing simple carbohydrates.  2. At risk for diabetes mellitus Susan Shields is at higher than average risk for developing diabetes due to her obesity.   Assessment/Plan:   1. Pre-diabetes [name] will continue to work on weight loss, exercise, and decreasing simple carbohydrates to help decrease the risk of diabetes. We will refill Victoza with a 90 day supply with no refills. [name] is ok to discontinue for 3 days, and if she has increased polyphagia while of Victoza she can restart.  - liraglutide (VICTOZA) 18 MG/3ML SOPN; Inject 0.3 mLs (1.8 mg total) into the skin every morning.  Dispense: 9 pen; Refill: 0  2. At risk for diabetes mellitus Susan Shields was given approximately 15 minutes of diabetes education and counseling today. We discussed intensive lifestyle modifications today with an emphasis on weight loss as well as increasing exercise and decreasing simple carbohydrates in her diet. We also reviewed  medication options with an emphasis on risk versus benefit of those discussed.   Repetitive spaced learning was employed today to elicit superior memory formation and behavioral change.  3. Class 2 severe obesity with serious comorbidity and body mass index (BMI) of 35.0 to 35.9 in adult, unspecified obesity type (HCC) Susan Shields is currently in the action stage of change. As such, her goal is to continue with weight loss efforts. She has agreed to keeping a food journal and adhering to recommended goals of 1200-1500 calories and 80+ grams of protein daily.   Susan Shields is ok to take a 3 month break, weigh herself 1 time per month. If she wants to restart sooner she can call to reschedule early. Behavioral modification strategies: increasing lean protein intake.  Susan Shields has agreed to follow-up with our clinic in 3 months. She was informed of the importance of frequent follow-up visits to maximize her success with intensive lifestyle modifications for her multiple health conditions.   Objective:   Blood pressure 117/73, pulse 84, temperature 98 F (36.7 C), temperature source Oral, height 5\' 4"  (1.626 m), weight 206 lb (93.4 kg), last menstrual period 05/05/2020, SpO2 100 %. Body mass index is 35.36 kg/m.  General: Cooperative, alert, well developed, in no acute distress. HEENT: Conjunctivae and lids unremarkable. Cardiovascular: Regular rhythm.  Lungs: Normal work of breathing. Neurologic: No focal deficits.   Lab Results  Component Value Date   CREATININE 1.07 (H) 05/07/2020   BUN 7 05/07/2020   NA 142 05/07/2020   K 4.1 05/07/2020   CL 105 05/07/2020  CO2 22 05/07/2020   Lab Results  Component Value Date   ALT 17 05/07/2020   AST 22 05/07/2020   ALKPHOS 51 05/07/2020   BILITOT 0.2 05/07/2020   Lab Results  Component Value Date   HGBA1C 5.2 05/07/2020   HGBA1C 5.3 11/17/2019   HGBA1C 5.4 01/25/2019   Lab Results  Component Value Date   INSULIN 12.7 05/07/2020   INSULIN  12.0 11/17/2019   INSULIN 13.0 01/25/2019   INSULIN 9.4 10/28/2018   Lab Results  Component Value Date   TSH 3.650 05/07/2020   Lab Results  Component Value Date   CHOL 250 (H) 05/07/2020   HDL 61 05/07/2020   LDLCALC 165 (H) 05/07/2020   TRIG 134 05/07/2020   Lab Results  Component Value Date   WBC 6.4 05/07/2020   HGB 13.3 05/07/2020   HCT 42.2 05/07/2020   MCV 86 05/07/2020   PLT 240 05/07/2020   No results found for: IRON, TIBC, FERRITIN  Attestation Statements:   Reviewed by clinician on day of visit: allergies, medications, problem list, medical history, surgical history, family history, social history, and previous encounter notes.   I, Burt Knack, am acting as transcriptionist for Quillian Quince, MD.  I have reviewed the above documentation for accuracy and completeness, and I agree with the above. -  Quillian Quince, MD

## 2020-06-13 MED FILL — UNIFINE PENTIPS 32GX5/32: 32G X 4 MM | 50 days supply | Qty: 100 | Fill #0

## 2020-06-26 MED FILL — SUMATRIPTAN SUCC 100 MG TAB: 100 | 30 days supply | Qty: 9 | Fill #0

## 2020-07-05 MED FILL — LEVOTHYROXINE 125 MCG TABLE: 125 | 90 days supply | Qty: 90 | Fill #0

## 2020-07-12 ENCOUNTER — Ambulatory Visit: Payer: No Typology Code available for payment source | Attending: Family Medicine | Admitting: Physical Therapy

## 2020-07-12 ENCOUNTER — Other Ambulatory Visit: Payer: Self-pay

## 2020-07-12 DIAGNOSIS — R293 Abnormal posture: Secondary | ICD-10-CM | POA: Diagnosis not present

## 2020-07-12 DIAGNOSIS — G8929 Other chronic pain: Secondary | ICD-10-CM | POA: Insufficient documentation

## 2020-07-12 DIAGNOSIS — M6281 Muscle weakness (generalized): Secondary | ICD-10-CM | POA: Diagnosis present

## 2020-07-12 DIAGNOSIS — M542 Cervicalgia: Secondary | ICD-10-CM | POA: Insufficient documentation

## 2020-07-12 DIAGNOSIS — M25511 Pain in right shoulder: Secondary | ICD-10-CM | POA: Insufficient documentation

## 2020-07-12 DIAGNOSIS — M25611 Stiffness of right shoulder, not elsewhere classified: Secondary | ICD-10-CM | POA: Diagnosis present

## 2020-07-12 NOTE — Therapy (Signed)
[facility] [address] [place], [place], [address] Phone: [phone]   Fax:  [phone]  Physical Therapy Evaluation  Patient Details  Name: Susan Shields MRN: [id number] Date of Birth: 1981/12/29 Referring Provider (PT): Deatra James, MD   Encounter Date: 07/12/2020   PT End of Session - 07/12/20 1650    Visit Number 1    Number of Visits 16    Date for PT Re-Evaluation 09/06/20    PT Start Time 1655    PT Stop Time 1740    PT Time Calculation (min) 45 min    Activity Tolerance Patient tolerated treatment well    Behavior During Therapy [facility] for tasks assessed/performed           Past Medical History:  Diagnosis Date   Constipation    Graves disease    Hyperlipidemia    IBS (irritable bowel syndrome)    Thyroid disease    Vitamin D deficiency     Past Surgical History:  Procedure Laterality Date   CESAREAN SECTION     COLONOSCOPY      There were no vitals filed for this visit.    Subjective Assessment - 07/12/20 1655    Subjective Pt reports increased R shoulder pain. Reports years of issues but it was manageable. 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 Raising her arm, taking her shirt off and too much shoulder movement causes problems. Pt unable to pin point direct place of pain (sometimes in the front and sometimes in the back of shoulder). At rest it's about 6/10 at worst it's a 10/10 pain.    Limitations Lifting;Writing;House hold activities    How long can you sit comfortably? n/a    How long can you stand comfortably? n/a    How long can you walk comfortably? n/a    Diagnostic tests none    Patient Stated Goals Get rid of pain, improve movement    Currently in Pain? Yes    Pain Score 6     Pain Location Shoulder    Pain Orientation Right    Pain Descriptors / Indicators Throbbing    Pain Type Chronic pain    Pain Onset More than a  month ago    Aggravating Factors  Raising arm    Pain Relieving Factors Unable to find anything at this point              [facility] PT Assessment - 07/12/20 0001      Assessment   Medical Diagnosis M25.511 (ICD-10-CM) - Pain in right shoulder    Referring Provider (PT) Deatra James, MD    Hand Dominance Right    Prior Therapy None      Balance Screen   Has the patient fallen in the past 6 months No      Home Environment   Living Environment Private residence    Living Arrangements Spouse/significant other;Children    Available Help at Discharge Family    Type of Home House      Prior Function   Level of Independence Independent    Vocation Full time employment      Observation/Other Assessments   Focus on Therapeutic Outcomes (FOTO)  47%      Posture/Postural Control   Posture/Postural Control Postural limitations    Postural Limitations Rounded Shoulders;Forward head;Increased thoracic kyphosis    Posture Comments anterior and superiorly oriented scapula  Tone   Assessment Location Right Upper Extremity      AROM   Right Shoulder Flexion 98 Degrees    Right Shoulder ABduction 80 Degrees    Right Shoulder Internal Rotation --   to L5; L shoulder IR to T4   Cervical Flexion WFL   Pull/tightness at end range   Cervical Extension WFL    Cervical - Right Side Bend WFL    Cervical - Left Side Bend 80% limited    Cervical - Right Rotation WFL    Cervical - Left Rotation 10% limited      PROM   Overall PROM Comments Pt with heavy guarding, unable to perform      Strength   Right/Left Shoulder --   Heavy guarding/pain, unable to perform. Grossly 3-/5   Right Elbow Flexion 4-/5    Right Elbow Extension 4-/5    Left Elbow Flexion 5/5    Left Elbow Extension 5/5    Right Forearm Pronation 4-/5    Right Forearm Supination 4-/5    Left Forearm Pronation 5/5    Left Forearm Supination 5/5    Right Wrist Flexion 4-/5    Right Wrist Extension 3+/5    Left Wrist  Flexion 5/5    Left Wrist Extension 5/5    Right Hand Grip (lbs) 65/65/60    Left Hand Grip (lbs) 65/55/50      Flexibility   Soft Tissue Assessment /Muscle Length --   Pec tightness 11 cm on L, 12 cm on R     Palpation   Palpation comment TTP upper trapezius, cervicothoracic paraspinals, biceps, pecs      Special Tests    Special Tests Cervical      Spurling's   Findings Negative      Distraction Test   Findngs Negative                      Objective measurements completed on examination: See above findings.               PT Education - 07/12/20 1742    Education Details Discussed anatomy of shoulder and how alignment of scapula with humerus can cause increased pain and limitations.    Person(s) Educated Patient    Methods Explanation    Comprehension Verbalized understanding            PT Short Term Goals - 07/12/20 1752      PT SHORT TERM GOAL #1   Title Pt will be independent with initial HEP    Time 4    Period Weeks    Status New    Target Date 08/09/20      PT SHORT TERM GOAL #2   Title Pt will have reduced pain at rest for daily tasks to </=4/10    Baseline Currently 6/10    Time 4    Period Weeks    Status New    Target Date 08/09/20      PT SHORT TERM GOAL #3   Title Pt will have full neck ROM without pain    Baseline L side bend limited 80%, L rotation 10% limited with tightness at end range    Time 4    Period Weeks    Status New    Target Date 08/09/20             PT Long Term Goals - 07/12/20 1754      PT LONG TERM GOAL #1  Title Pt will be independent with advanced HEP    Time 8    Period Weeks    Status New    Target Date 09/06/20      PT LONG TERM GOAL #2   Title Pt will be able to reach overhead for dressing and household tasks with </= 2/10 pain    Baseline Unable without pain    Time 8    Period Weeks    Status New    Target Date 09/06/20      PT LONG TERM GOAL #3   Title R shoulder AROM  will be symmetrical to L shoulder for pt to reach overhead    Time 8    Period Weeks    Status New    Target Date 09/06/20      PT LONG TERM GOAL #4   Title Pt R UE strength will be grossly at least 5-/5 to be symmetrical with L UE    Time 8    Period Weeks    Status New    Target Date 09/06/20      PT LONG TERM GOAL #5   Title Pt will have improved FOTO score to </=28%    Baseline 47% impairment    Time 8    Period Weeks    Status New    Target Date 09/06/20                  Plan - 07/12/20 1743    Clinical Impression Statement Pt is a 39 y/o F presenting to clinic with chronic R shoulder pain. Pt's posture demonstrates anteriorly and superiorly tilted scapula with rounded shoulders and forward head. Pt's pain limits neck L side bending, shoulder flexion and abduction ROM, with decreased R UE strength. Pt with poor scapulohumeral rhythm. Pt with increased tone/spasm in trapezius, pecs, and biceps leading to increased R UE guarding with PROM. Pt would highly benefit from therapy to address these issues to improve her ability to perform shoulder and overhead tasks.    Personal Factors and Comorbidities Past/Current Experience;Time since onset of injury/illness/exacerbation    Examination-Activity Limitations Bathing;Caring for SunGardthers;Carry;Dressing;Lift;Reach Overhead;Toileting    Examination-Participation Restrictions Cleaning;Community Activity;Driving;Laundry;Meal Prep;Other   work   OptometristClinical Decision Making Low    Rehab Potential Good    PT Frequency 2x / week    PT Duration 8 weeks    PT Treatment/Interventions ADLs/Self Care Home Management;Cryotherapy;Electrical Stimulation;Iontophoresis 4mg /ml Dexamethasone;Moist Heat;Traction;Ultrasound;Functional mobility training;Therapeutic activities;Therapeutic exercise;Neuromuscular re-education;Patient/family education;Manual techniques;Passive range of motion;Dry needling;Taping;Vasopneumatic Device;Joint Manipulations    PT  Next Visit Plan Assess response to HEP. Assess thoracic spine mobility. Perform manual therapy and PROM as able. Consider shoulder AAROM due to heavy guarding. Continue periscapular strengthening and stretching for posture.    PT Home Exercise Plan Access Code: [email or web address]    Consulted and Agree with Plan of Care Patient           Patient will benefit from skilled therapeutic intervention in order to improve the following deficits and impairments:  Decreased coordination, Decreased range of motion, Impaired tone, Increased fascial restricitons, Increased muscle spasms, Impaired UE functional use, Pain, Hypomobility, Impaired flexibility, Improper body mechanics, Decreased mobility, Decreased strength, Postural dysfunction  Visit Diagnosis: Abnormal posture  Chronic right shoulder pain  Stiffness of right shoulder, not elsewhere classified  Cervicalgia  Muscle weakness (generalized)     Problem List Patient Active Problem List   Diagnosis Date Noted   Prediabetes 11/26/2019   Vitamin D deficiency 11/26/2019  Insulin resistance 12/07/2018   Other hyperlipidemia 11/01/2018   Other specified hypothyroidism 11/01/2018   Class 1 obesity with serious comorbidity and body mass index (BMI) of 34.0 to 34.9 in adult 11/01/2018   Slow transit constipation 10/11/2014    [facility] April Ma L Indigo Barbian PT, DPT 07/12/2020, 6:06 PM  [facility] [address] [place], [place], [address] Phone: [phone]   Fax:  [phone]  Name: Susan Shields MRN: [id number] Date of Birth: 05/08/1981

## 2020-07-12 NOTE — Addendum Note (Signed)
Addended by: Jules Husbands MARIE L on: 07/12/2020 06:09 PM   Modules accepted: Orders

## 2020-07-12 NOTE — Patient Instructions (Signed)
Access Code: [email or web address] URL: [email or web address] Date: 07/12/2020 Prepared by: Vernon Prey April Kirstie Peri  Exercises Seated Gentle Upper Trapezius Stretch - 1 x daily - 7 x weekly - 3 sets - 20 sec hold Gentle Levator Scapulae Stretch - 1 x daily - 7 x weekly - 3 sets - 20 sec hold Doorway Pec Stretch at 60 Elevation - 1 x daily - 7 x weekly - 3 sets - 20 sec hold Doorway Pec Stretch at 90 Degrees Abduction - 1 x daily - 7 x weekly - 3 sets - 20 sec hold Seated Scapular Retraction - 1 x daily - 7 x weekly - 3 sets - 10 reps Standing Cervical Retraction - 1 x daily - 7 x weekly - 3 sets - 10 reps

## 2020-07-27 ENCOUNTER — Ambulatory Visit: Payer: No Typology Code available for payment source | Admitting: Physical Therapy

## 2020-07-27 ENCOUNTER — Other Ambulatory Visit: Payer: Self-pay

## 2020-07-27 DIAGNOSIS — M542 Cervicalgia: Secondary | ICD-10-CM

## 2020-07-27 DIAGNOSIS — G8929 Other chronic pain: Secondary | ICD-10-CM

## 2020-07-27 DIAGNOSIS — M6281 Muscle weakness (generalized): Secondary | ICD-10-CM

## 2020-07-27 DIAGNOSIS — R293 Abnormal posture: Secondary | ICD-10-CM | POA: Diagnosis not present

## 2020-07-27 DIAGNOSIS — M25611 Stiffness of right shoulder, not elsewhere classified: Secondary | ICD-10-CM

## 2020-07-27 NOTE — Therapy (Signed)
[facility] [address] [place], [place], [address] Phone: [phone]   Fax:  [phone]  Physical Therapy Treatment  Patient Details  Name: Susan Shields MRN: [id number] Date of Birth: [birth year] Referring Provider (PT): Deatra James, MD   Encounter Date: 07/27/2020   PT End of Session - 07/27/20 1009    Visit Number 2    Number of Visits 16    Date for PT Re-Evaluation 09/06/20    PT Start Time 1009    PT Stop Time 1053    PT Time Calculation (min) 44 min    Activity Tolerance Patient tolerated treatment well    Behavior During Therapy [facility] for tasks assessed/performed           Past Medical History:  Diagnosis Date  . Constipation   . Graves disease   . Hyperlipidemia   . IBS (irritable bowel syndrome)   . Thyroid disease   . Vitamin D deficiency     Past Surgical History:  Procedure Laterality Date  . CESAREAN SECTION    . COLONOSCOPY      There were no vitals filed for this visit.   Subjective Assessment - 07/27/20 1011    Subjective Pt reports she does feel that the shoulder is a little looser. She reports she did go to [employer] and ride a roller coaster that might have made it tighten back up a little bit. Improved pain by end of session.    Currently in Pain? Yes    Pain Score 6                              [facility] Adult PT Treatment/Exercise - 07/27/20 0001      Shoulder Exercises: Supine   Flexion Strengthening;Right;10 reps    ABduction AAROM;Right;10 reps      Shoulder Exercises: Seated   Flexion Strengthening;Right;10 reps    Abduction Strengthening;10 reps    ABduction Limitations Required scapular assist    Other Seated Exercises First rib mobilization x 10      Shoulder Exercises: Prone   Other Prone Exercises I's, Y's, T's x 10      Shoulder Exercises: Stretch   Other Shoulder Stretches Sleeper stretch x 30 sec    Other Shoulder Stretches Cross body stretch x 30  sec      Manual Therapy   Manual Therapy Soft tissue mobilization;Myofascial release;Joint mobilization    Joint Mobilization Posterior and inferior capsule grade III mobs    Soft tissue mobilization Upper trap along insertion of shoulder, pecs                    PT Short Term Goals - 07/12/20 1752      PT SHORT TERM GOAL #1   Title Pt will be independent with initial HEP    Time 4    Period Weeks    Status New    Target Date 08/09/20      PT SHORT TERM GOAL #2   Title Pt will have reduced pain at rest for daily tasks to </=4/10    Baseline Currently 6/10    Time 4    Period Weeks    Status New    Target Date 08/09/20      PT SHORT TERM GOAL #3   Title Pt will have full neck ROM without pain    Baseline L side bend limited 80%, L rotation  10% limited with tightness at end range    Time 4    Period Weeks    Status New    Target Date 08/09/20             PT Long Term Goals - 07/12/20 1754      PT LONG TERM GOAL #1   Title Pt will be independent with advanced HEP    Time 8    Period Weeks    Status New    Target Date 09/06/20      PT LONG TERM GOAL #2   Title Pt will be able to reach overhead for dressing and household tasks with </= 2/10 pain    Baseline Unable without pain    Time 8    Period Weeks    Status New    Target Date 09/06/20      PT LONG TERM GOAL #3   Title R shoulder AROM will be symmetrical to L shoulder for pt to reach overhead    Time 8    Period Weeks    Status New    Target Date 09/06/20      PT LONG TERM GOAL #4   Title Pt R UE strength will be grossly at least 5-/5 to be symmetrical with L UE    Time 8    Period Weeks    Status New    Target Date 09/06/20      PT LONG TERM GOAL #5   Title Pt will have improved FOTO score to </=28%    Baseline 47% impairment    Time 8    Period Weeks    Status New    Target Date 09/06/20                 Plan - 07/27/20 1042    Clinical Impression Statement Pt presents  to clinic with improved muscle length. Initially with limitations to ~90 deg in flexion and abduction due to pain with compensation. Provided manual therapy for upper trap and pec, GH mobilizations and cueing for scapular placement and pt able to achieve full ROM pain free. PT progressed pt to scapular strengthening exercises.    Personal Factors and Comorbidities Past/Current Experience;Time since onset of injury/illness/exacerbation    Examination-Activity Limitations Bathing;Caring for [employer];Toileting    Examination-Participation Restrictions Cleaning;Community Activity;Driving;Laundry;Meal Prep;Other   work   Rehab Potential Good    PT Frequency 2x / week    PT Duration 8 weeks    PT Treatment/Interventions ADLs/Self Care Home Management;Cryotherapy;Electrical Stimulation;Iontophoresis 4mg /ml Dexamethasone;Moist Heat;Traction;Ultrasound;Functional mobility training;Therapeutic activities;Therapeutic exercise;Neuromuscular re-education;Patient/family education;Manual techniques;Passive range of motion;Dry needling;Taping;Vasopneumatic Device;Joint Manipulations    PT Next Visit Plan Assess response to HEP. Assess thoracic spine mobility. Perform manual therapy and PROM as able. Continue periscapular strengthening and stretching for posture.    PT Home Exercise Plan Access Code: [email or web address]    Consulted and Agree with Plan of Care Patient           Patient will benefit from skilled therapeutic intervention in order to improve the following deficits and impairments:  Decreased coordination, Decreased range of motion, Impaired tone, Increased fascial restricitons, Increased muscle spasms, Impaired UE functional use, Pain, Hypomobility, Impaired flexibility, Improper body mechanics, Decreased mobility, Decreased strength, Postural dysfunction  Visit Diagnosis: Abnormal posture  Chronic right shoulder pain  Stiffness of right shoulder, not elsewhere  classified  Cervicalgia  Muscle weakness (generalized)     Problem List Patient Active Problem List   Diagnosis Date Noted  .  Prediabetes 11/26/2019  . Vitamin D deficiency 11/26/2019  . Insulin resistance 12/07/2018  . Other hyperlipidemia 11/01/2018  . Other specified hypothyroidism 11/01/2018  . Class 1 obesity with serious comorbidity and body mass index (BMI) of 34.0 to 34.9 in adult 11/01/2018  . Slow transit constipation 10/11/2014    [facility] April Ma L Shiela Bruns PT, DPT 07/27/2020, 11:49 AM  [facility] [address] [place], [place], [address] Phone: [phone]   Fax:  [phone]  Name: KSENIA KUNZ MRN: [id number] Date of Birth: 02/18/1981

## 2020-07-30 MED FILL — LINZESS 290 MCG CAPSULE: 290 | 90 days supply | Qty: 90 | Fill #3

## 2020-08-15 MED FILL — VIT D2 1.25 MG (50,000 UNIT: 1.25 MG | 84 days supply | Qty: 12 | Fill #1

## 2020-08-20 ENCOUNTER — Ambulatory Visit: Payer: No Typology Code available for payment source | Admitting: Physical Therapy

## 2020-08-28 ENCOUNTER — Encounter: Payer: Self-pay | Admitting: Physical Therapy

## 2020-08-28 ENCOUNTER — Other Ambulatory Visit: Payer: Self-pay

## 2020-08-28 ENCOUNTER — Ambulatory Visit: Payer: No Typology Code available for payment source | Attending: Family Medicine | Admitting: Physical Therapy

## 2020-08-28 DIAGNOSIS — M25611 Stiffness of right shoulder, not elsewhere classified: Secondary | ICD-10-CM | POA: Diagnosis present

## 2020-08-28 DIAGNOSIS — G8929 Other chronic pain: Secondary | ICD-10-CM | POA: Insufficient documentation

## 2020-08-28 DIAGNOSIS — R293 Abnormal posture: Secondary | ICD-10-CM | POA: Insufficient documentation

## 2020-08-28 DIAGNOSIS — M542 Cervicalgia: Secondary | ICD-10-CM | POA: Insufficient documentation

## 2020-08-28 DIAGNOSIS — M6281 Muscle weakness (generalized): Secondary | ICD-10-CM | POA: Diagnosis present

## 2020-08-28 DIAGNOSIS — M25511 Pain in right shoulder: Secondary | ICD-10-CM | POA: Diagnosis present

## 2020-08-28 NOTE — Therapy (Signed)
[place], [place], [address] Phone: [phone]   Fax:  [phone]  Physical Therapy Treatment and Discharge  Patient Details  Name: Susan Shields MRN: [id number] Date of Birth: September 30, 1981 Referring Provider (PT): Donald Prose, MD   PHYSICAL THERAPY DISCHARGE SUMMARY  Visits from Start of Care: 3  Current functional level related to goals / functional outcomes: Pt has met all goals   Remaining deficits: None   Education / Equipment: Discussed self management techniques with tennis ball, foam roll, and theracane  Plan: Patient agrees to discharge.  Patient goals were met. Patient is being discharged due to meeting the stated rehab goals.  ?????        Encounter Date: 08/28/2020   PT End of Session - 08/28/20 0826    Visit Number 3    Number of Visits 16    Date for PT Re-Evaluation 09/06/20    PT Start Time 0830    PT Stop Time 0916    PT Time Calculation (min) 46 min    Activity Tolerance Patient tolerated treatment well    Behavior During Therapy [facility] for tasks assessed/performed             Past Medical History:  Diagnosis Date  . Constipation   . Graves disease   . Hyperlipidemia   . IBS (irritable bowel syndrome)   . Thyroid disease   . Vitamin D deficiency     Past Surgical History:  Procedure Laterality Date  . CESAREAN SECTION    . COLONOSCOPY      There were no vitals filed for this visit.   Subjective Assessment - 08/28/20 0858    Subjective Pt states she was doing very well and considered canceling her appointments; however, on Saturday she slept wrong and felt increased pain and stiffness affecting her neck and shoulder again. TTP along upper trap (rated 7/10).    Limitations Lifting;Writing;House hold activities    How long can you sit comfortably? n/a    How long can you stand comfortably? n/a    How long can you walk comfortably? n/a    Diagnostic tests none     Patient Stated Goals Get rid of pain, improve movement    Currently in Pain? Yes    Pain Score 7     Pain Location Shoulder    Pain Orientation Right                             [facility] Adult PT Treatment/Exercise - 08/28/20 0001      Shoulder Exercises: Sidelying   Other Sidelying Exercises open close book 5x5 sec, with circles CW & CCW 5x each way      Shoulder Exercises: Standing   External Rotation Strengthening;Both;20 reps    Row Strengthening;Both;20 reps;Theraband    Theraband Level (Shoulder Row) Level 3 (Green)    Other Standing Exercises low trap setting 2x10    Other Standing Exercises wall angel 2x10      Shoulder Exercises: Stretch   Other Shoulder Stretches foam roll vertical x10, foam roll horizontal for PA self mobs x 10      Manual Therapy   Manual Therapy Soft tissue mobilization;Myofascial release;Joint mobilization    Joint Mobilization PA thoracic mobs    Soft tissue mobilization upper trap, rhomboids                  PT Education -  08/28/20 0919    Education Details educated pt on foam roll and use of theracane for self massage and self joint mobilizations    Person(s) Educated Patient    Methods Explanation;Demonstration;Tactile cues;Verbal cues    Comprehension Verbalized understanding;Returned demonstration;Verbal cues required            PT Short Term Goals - 08/28/20 0912      PT SHORT TERM GOAL #1   Title Pt will be independent with initial HEP    Time 4    Period Weeks    Status Achieved    Target Date 08/09/20      PT SHORT TERM GOAL #2   Title Pt will have reduced pain at rest for daily tasks to </=4/10    Baseline Currently 6/10    Time 4    Period Weeks    Status Achieved    Target Date 08/09/20      PT SHORT TERM GOAL #3   Title Pt will have full neck ROM without pain    Baseline L side bend limited 80%, L rotation 10% limited with tightness at end range    Time 4    Period Weeks    Status  Achieved    Target Date 08/09/20             PT Long Term Goals - 08/28/20 0913      PT LONG TERM GOAL #1   Title Pt will be independent with advanced HEP    Time 8    Period Weeks    Status Achieved      PT LONG TERM GOAL #2   Title Pt will be able to reach overhead for dressing and household tasks with </= 2/10 pain    Baseline Unable without pain    Time 8    Period Weeks    Status Achieved      PT LONG TERM GOAL #3   Title R shoulder AROM will be symmetrical to L shoulder for pt to reach overhead    Time 8    Period Weeks    Status Achieved      PT LONG TERM GOAL #4   Title Pt R UE strength will be grossly at least 5-/5 to be symmetrical with L UE    Time 8    Period Weeks    Status Achieved      PT LONG TERM GOAL #5   Title Pt will have improved FOTO score to </=28%    Baseline 47% impairment    Time 8    Period Weeks    Status Achieved                 Plan - 08/28/20 0920    Clinical Impression Statement Pt with great improvement and reports ability to return to jumping jacks and normal activity. Pt reports minor exacerbation in her right shoulder. Pt found to have hypomobile thoracic spine that is likely a contributing factor to her increased pain. PT addressed with manual therapy, stretching, and scapular strengthening exercises. Pt provided education on self management techniques including theracane and foam roll for any new tightness or exacerbations. Pt has met all PT goals and FOTO score has improved to 26% impairment. Pt is ready for PT d/c.    Personal Factors and Comorbidities Past/Current Experience;Time since onset of injury/illness/exacerbation    Examination-Activity Limitations Bathing;Caring for [employer];Toileting    Examination-Participation Restrictions Cleaning;Community Activity;Driving;Laundry;Meal Prep;Other   work   Nurse, adult  Potential Good    PT Frequency 2x / week    PT Duration 8 weeks    PT  Treatment/Interventions ADLs/Self Care Home Management;Cryotherapy;Electrical Stimulation;Iontophoresis 13m/ml Dexamethasone;Moist Heat;Traction;Ultrasound;Functional mobility training;Therapeutic activities;Therapeutic exercise;Neuromuscular re-education;Patient/family education;Manual techniques;Passive range of motion;Dry needling;Taping;Vasopneumatic Device;Joint Manipulations    PT Next Visit Plan Assess response to HEP. Assess thoracic spine mobility. Perform manual therapy and PROM as able. Continue periscapular strengthening and stretching for posture.    PT Home Exercise Plan Access Code: [email or web address]    [email or web address] Agree with Plan of Care Patient           Patient will benefit from skilled therapeutic intervention in order to improve the following deficits and impairments:  Decreased coordination, Decreased range of motion, Impaired tone, Increased fascial restricitons, Increased muscle spasms, Impaired UE functional use, Pain, Hypomobility, Impaired flexibility, Improper body mechanics, Decreased mobility, Decreased strength, Postural dysfunction  Visit Diagnosis: Abnormal posture  Chronic right shoulder pain  Stiffness of right shoulder, not elsewhere classified  Cervicalgia  Muscle weakness (generalized)     Problem List Patient Active Problem List   Diagnosis Date Noted  . Prediabetes 11/26/2019  . Vitamin D deficiency 11/26/2019  . Insulin resistance 12/07/2018  . Other hyperlipidemia 11/01/2018  . Other specified hypothyroidism 11/01/2018  . Class 1 obesity with serious comorbidity and body mass index (BMI) of 34.0 to 34.9 in adult 11/01/2018  . Slow transit constipation 10/11/2014    [facility] Ma L Sofia Vanmeter PT, DPT 08/28/2020, 9:24 AM  [facility] [address]Phone: [phone]  Fax:  [phone] Name: JCOLENA KETTERMANMRN: [id number] of Birth: [birth year]

## 2020-09-10 ENCOUNTER — Ambulatory Visit (INDEPENDENT_AMBULATORY_CARE_PROVIDER_SITE_OTHER): Payer: No Typology Code available for payment source | Admitting: Family Medicine

## 2020-10-04 MED FILL — LEVOTHYROXINE 125 MCG TABLE: 125 | 90 days supply | Qty: 90 | Fill #1

## 2020-11-05 ENCOUNTER — Other Ambulatory Visit: Payer: Self-pay | Admitting: Gastroenterology

## 2020-11-06 ENCOUNTER — Other Ambulatory Visit (HOSPITAL_COMMUNITY): Payer: Self-pay | Admitting: Family Medicine

## 2020-11-06 MED FILL — VIT D2 1.25 MG (50,000 UNIT: 1.25 MG | 90 days supply | Qty: 13 | Fill #0

## 2020-11-07 ENCOUNTER — Other Ambulatory Visit: Payer: Self-pay | Admitting: Gastroenterology

## 2020-11-07 ENCOUNTER — Telehealth: Payer: Self-pay | Admitting: Gastroenterology

## 2020-11-07 MED FILL — LINZESS 290 MCG CAPSULE: 290 | 90 days supply | Qty: 90 | Fill #0

## 2020-11-07 NOTE — Telephone Encounter (Signed)
Informed patient I will send her mediation to last until her appt. Patient scheduled appt for 01/01/20 at 9:30am. Prescription sent to her pharmacy.

## 2020-11-07 NOTE — Telephone Encounter (Signed)
Pt is requesting a medication refill on her [facility], pt has not been seen since 06/2019, offered appt but first available is 11/30, pt did not want to take appt with APP.

## 2020-11-28 ENCOUNTER — Other Ambulatory Visit (HOSPITAL_COMMUNITY): Payer: Self-pay | Admitting: Family Medicine

## 2020-11-29 ENCOUNTER — Other Ambulatory Visit (HOSPITAL_COMMUNITY): Payer: Self-pay | Admitting: Family Medicine

## 2020-11-29 MED FILL — MELOXICAM 15 MG TABLET: 15 | 30 days supply | Qty: 30 | Fill #0

## 2020-12-14 NOTE — Progress Notes (Signed)
Office Visit Note  Patient: Susan Shields             Date of Birth: Jun 16, 1981           MRN: [id number]             PCP: Donald Prose, MD Referring: Harlan Stains, MD Visit Date: 12/17/2020 Occupation: '@GUAROCC' @  Subjective:  Pain in multiple joints.   History of Present Illness: Susan Shields is a 39 y.o. female seen in consultation per request of Dr. Dema Severin. According to the patient her symptoms started about 6 months ago with left knee joint pain. She states she decided to lose weight and did not notice any improvement. She was seen by her PCP about 3 months ago and discussed about her joint discomfort. In November 2021 she started having pain in her both wrist joints in both hands. She noticed some swelling in her knee joint but none of the other joints were swollen. About 2-1/2 weeks ago as she had increased pain she was prescribed meloxicam 15 mg tablet once a day. She noticed improvement in her knee joint pain and also her wrist joint pain. She continues to have throbbing pain in her bilateral hands. She complains of tingling in her bilateral hands. She states the pain is constant and not related to certain activities. She also noticed increased throbbing while she is driving. None of the other joints are painful. There is no family history of autoimmune disease.  Activities of Daily Living:  Patient reports morning stiffness for 0 minutes.   Patient Denies nocturnal pain.  Difficulty dressing/grooming: Denies Difficulty climbing stairs: Reports Difficulty getting out of chair: Denies Difficulty using hands for taps, buttons, cutlery, and/or writing: Reports  Review of Systems  Constitutional: Negative for fatigue.  HENT: Negative for mouth sores, mouth dryness and nose dryness.   Eyes: Positive for dryness. Negative for pain and itching.  Respiratory: Negative for shortness of breath and difficulty breathing.   Cardiovascular: Negative for chest pain and palpitations.   Gastrointestinal: Negative for blood in stool, constipation and diarrhea.  Endocrine: Negative for increased urination.  Genitourinary: Negative for difficulty urinating.  Musculoskeletal: Positive for arthralgias, joint pain and joint swelling. Negative for myalgias, morning stiffness, muscle tenderness and myalgias.  Skin: Positive for sensitivity to sunlight. Negative for color change, rash and redness.  Allergic/Immunologic: Negative for susceptible to infections.  Neurological: Positive for dizziness, numbness and headaches. Negative for memory loss and weakness.       H/o migraine  Hematological: Negative for bruising/bleeding tendency.  Psychiatric/Behavioral: Negative for confusion.    PMFS History:  Patient Active Problem List   Diagnosis Date Noted  . Prediabetes 11/26/2019  . Vitamin D deficiency 11/26/2019  . Insulin resistance 12/07/2018  . Other hyperlipidemia 11/01/2018  . Other specified hypothyroidism 11/01/2018  . Class 1 obesity with serious comorbidity and body mass index (BMI) of 34.0 to 34.9 in adult 11/01/2018  . Slow transit constipation 10/11/2014    Past Medical History:  Diagnosis Date  . Constipation   . Graves disease   . Hyperlipidemia   . IBS (irritable bowel syndrome)   . Thyroid disease   . Vitamin D deficiency     Family History  Problem Relation Age of Onset  . Diabetes Mother   . Cancer Mother        MMT Cancer  . Obesity Mother   . Throat cancer Father   . Sudden death Father   . Stroke Father   .  Healthy Sister   . Healthy Brother   . Healthy Sister   . Healthy Brother   . Healthy Brother   . Healthy Son    Past Surgical History:  Procedure Laterality Date  . CESAREAN SECTION    . COLONOSCOPY     Social History   Social History Narrative  . Not on file   Immunization History  Administered Date(s) Administered  . PFIZER SARS-COV-2 Vaccination 01/13/2020, 02/03/2020     Objective: Vital Signs: BP 128/83 (BP  Location: Right Arm, Patient Position: Sitting, Cuff Size: Normal)   Pulse 67   Ht 5' 4.25" (1.632 m)   Wt 215 lb 3.2 oz (97.6 kg)   BMI 36.65 kg/m    Physical Exam Vitals and nursing note reviewed.  Constitutional:      Appearance: She is well-developed and well-nourished.  HENT:     Head: Normocephalic and atraumatic.  Eyes:     Extraocular Movements: EOM normal.     Conjunctiva/sclera: Conjunctivae normal.  Cardiovascular:     Rate and Rhythm: Normal rate and regular rhythm.     Pulses: Intact distal pulses.     Heart sounds: Normal heart sounds.  Pulmonary:     Effort: Pulmonary effort is normal.     Breath sounds: Normal breath sounds.  Abdominal:     General: Bowel sounds are normal.     Palpations: Abdomen is soft.  Musculoskeletal:     Cervical back: Normal range of motion.  Lymphadenopathy:     Cervical: No cervical adenopathy.  Skin:    General: Skin is warm and dry.     Capillary Refill: Capillary refill takes less than 2 seconds.  Neurological:     Mental Status: She is alert and oriented to person, place, and time.  Psychiatric:        Mood and Affect: Mood and affect normal.        Behavior: Behavior normal.      Musculoskeletal Exam: C-spine, thoracic and lumbar spine with good range of motion.  She had no SI joint tenderness.  Shoulder joints, elbow joints, wrist joints, MCPs PIPs and DIPs with good range of motion with no synovitis.  She has some tenderness over bilateral median nerve on manual compression.  Phalen's was negative.  Hip joints, knee joints, ankles, MTPs and PIPs with good range of motion.  She has some discomfort in her left knee joint with range of motion.  No warmth swelling or effusion was noted.  There was no tenderness over ankles or MTPs.  CDAI Exam: CDAI Score: -- Patient Global: --; Provider Global: -- Swollen: --; Tender: -- Joint Exam 12/17/2020   No joint exam has been documented for this visit   There is currently no  information documented on the homunculus. Go to the Rheumatology activity and complete the homunculus joint exam.  Investigation: No additional findings.  Imaging: XR Hand 2 View Left  Result Date: 12/17/2020 CMC, PIP and DIP narrowing was noted.  No MCP, intercarpal or radiocarpal joint space narrowing was noted.  No erosive changes were noted. Impression: These findings are consistent with osteoarthritis of the hand.  XR Hand 2 View Right  Result Date: 12/17/2020 CMC, PIP and DIP narrowing was noted.  No MCP, intercarpal or radiocarpal joint space narrowing was noted.  No erosive changes were noted. Impression: These findings are consistent with osteoarthritis of the hand.  XR KNEE 3 VIEW LEFT  Result Date: 12/17/2020 No medial lateral compartment narrowing was noted.  Mild patellofemoral narrowing was noted.  No chondrocalcinosis was noted. 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   Recent Labs: Lab Results  Component Value Date   WBC 6.4 05/07/2020   HGB 13.3 05/07/2020   PLT 240 05/07/2020   NA 142 05/07/2020   K 4.1 05/07/2020   CL 105 05/07/2020   CO2 22 05/07/2020   GLUCOSE 77 05/07/2020   BUN 7 05/07/2020   CREATININE 1.07 (H) 05/07/2020   BILITOT 0.2 05/07/2020   ALKPHOS 51 05/07/2020   AST 22 05/07/2020   ALT 17 05/07/2020   PROT 7.1 05/07/2020   ALBUMIN 4.1 05/07/2020   CALCIUM 9.2 05/07/2020   GFRAA 75 05/07/2020    Speciality Comments: No specialty comments available.  Procedures:  No procedures performed Allergies: Simvastatin and Tetanus-diphth-acell pertussis   Assessment / Plan:     Visit Diagnoses: Polyarthralgia - 11/28/20: RF<10, ESR 32, TSH 1.57, vitamin D 39.5, ANA negative   Patellofemoral pain syndrome of left knee -she has been experiencing pain and discomfort in her left knee joint for the last 6 months.  She states the pain has improved since she has been taking meloxicam.  She recalls having some  swelling in her left knee joint.  Plan: XR KNEE 3 VIEW LEFT.  X-ray showed mild chondromalacia patella.  Have given her a handout on exercises.  Pain in both hands -she complains of discomfort in her bilateral hands.  No synovitis was noted.  She states his symptoms have improved since she has been taking meloxicam.  Plan: XR Hand 2 View Right, XR Hand 2 View Left.  X-rays were consistent with osteoarthritis involving bilateral hands.  A handout on exercises was given.  Joint protection muscle strengthening was discussed.  I will repeat sed rate and will also obtain anti-CCP and '14 3 3 ' eta antibodies.  Paresthesia of both hands-she complains of paresthesias in her bilateral hands.  She states her symptoms get worse when she is driving.  She is on the computer all day.  Manual compression test was positive Phalen's and Tinel's test were negative.  Have advised him to hold Mobic for 2 weeks prior to the ultrasound.  I will schedule ultrasound of bilateral hands and median nerve.  History of Graves' disease - hx of graves with hypothyroidism s/p radioactive ablation   Vitamin D deficiency  Hypercholesterolemia  History of IBS  BMI 36.65-patient is trying weight loss.  Diet and exercise was emphasized.  Educated about COVID-19 virus infection-she is fully vaccinated against COVID-19.  She was advised to get a booster.  Use of mask, social distancing and hand hygiene was discussed.   Orders: Orders Placed This Encounter  Procedures  . XR KNEE 3 VIEW LEFT  . XR Hand 2 View Right  . XR Hand 2 View Left  . Sedimentation rate  . Cyclic citrul peptide antibody, IgG  . 14-3-3 eta Protein   No orders of the defined types were placed in this encounter.    Follow-Up Instructions: Return for Pain in multiple joints.   Bo Merino, MD  Note - This record has been created using Editor, commissioning.  Chart creation errors have been sought, but may not always  have been located. Such creation  errors do not reflect on  the standard of medical care.

## 2020-12-17 ENCOUNTER — Ambulatory Visit: Payer: No Typology Code available for payment source

## 2020-12-17 ENCOUNTER — Encounter: Payer: Self-pay | Admitting: Rheumatology

## 2020-12-17 ENCOUNTER — Ambulatory Visit: Payer: Self-pay

## 2020-12-17 ENCOUNTER — Ambulatory Visit: Payer: No Typology Code available for payment source | Admitting: Rheumatology

## 2020-12-17 ENCOUNTER — Other Ambulatory Visit: Payer: Self-pay

## 2020-12-17 VITALS — BP 128/83 | HR 67 | Ht 64.25 in | Wt 215.2 lb

## 2020-12-17 DIAGNOSIS — R202 Paresthesia of skin: Secondary | ICD-10-CM | POA: Diagnosis not present

## 2020-12-17 DIAGNOSIS — M222X2 Patellofemoral disorders, left knee: Secondary | ICD-10-CM | POA: Diagnosis not present

## 2020-12-17 DIAGNOSIS — E559 Vitamin D deficiency, unspecified: Secondary | ICD-10-CM

## 2020-12-17 DIAGNOSIS — M2550 Pain in unspecified joint: Secondary | ICD-10-CM

## 2020-12-17 DIAGNOSIS — M79642 Pain in left hand: Secondary | ICD-10-CM | POA: Diagnosis not present

## 2020-12-17 DIAGNOSIS — Z6836 Body mass index (BMI) 36.0-36.9, adult: Secondary | ICD-10-CM

## 2020-12-17 DIAGNOSIS — Z8639 Personal history of other endocrine, nutritional and metabolic disease: Secondary | ICD-10-CM

## 2020-12-17 DIAGNOSIS — M79641 Pain in right hand: Secondary | ICD-10-CM

## 2020-12-17 DIAGNOSIS — Z7189 Other specified counseling: Secondary | ICD-10-CM

## 2020-12-17 DIAGNOSIS — Z8719 Personal history of other diseases of the digestive system: Secondary | ICD-10-CM

## 2020-12-17 DIAGNOSIS — E7800 Pure hypercholesterolemia, unspecified: Secondary | ICD-10-CM

## 2020-12-17 NOTE — Patient Instructions (Addendum)
Knee Exercises Ask your health care provider which exercises are safe for you. Do exercises exactly as told by your health care provider and adjust them as directed. It is normal to feel mild stretching, pulling, tightness, or discomfort as you do these exercises. Stop right away if you feel sudden pain or your pain gets worse. Do not begin these exercises until told by your health care provider. Stretching and range-of-motion exercises These exercises warm up your muscles and joints and improve the movement and flexibility of your knee. These exercises also help to relieve pain and swelling. Knee extension, prone 1. Lie on your abdomen (prone position) on a bed. 2. Place your left / right knee just beyond the edge of the surface so your knee is not on the bed. You can put a towel under your left / right thigh just above your kneecap for comfort. 3. Relax your leg muscles and allow gravity to straighten your knee (extension). You should feel a stretch behind your left / right knee. 4. Hold this position for __________ seconds. 5. Scoot up so your knee is supported between repetitions. Repeat __________ times. Complete this exercise __________ times a day. Knee flexion, active  1. Lie on your back with both legs straight. If this causes back discomfort, bend your left / right knee so your foot is flat on the floor. 2. Slowly slide your left / right heel back toward your buttocks. Stop when you feel a gentle stretch in the front of your knee or thigh (flexion). 3. Hold this position for __________ seconds. 4. Slowly slide your left / right heel back to the starting position. Repeat __________ times. Complete this exercise __________ times a day. Quadriceps stretch, prone  1. Lie on your abdomen on a firm surface, such as a bed or padded floor. 2. Bend your left / right knee and hold your ankle. If you cannot reach your ankle or pant leg, loop a belt around your foot and grab the belt  instead. 3. Gently pull your heel toward your buttocks. Your knee should not slide out to the side. You should feel a stretch in the front of your thigh and knee (quadriceps). 4. Hold this position for __________ seconds. Repeat __________ times. Complete this exercise __________ times a day. Hamstring, supine 1. Lie on your back (supine position). 2. Loop a belt or towel over the ball of your left / right foot. The ball of your foot is on the walking surface, right under your toes. 3. Straighten your left / right knee and slowly pull on the belt to raise your leg until you feel a gentle stretch behind your knee (hamstring). ? Do not let your knee bend while you do this. ? Keep your other leg flat on the floor. 4. Hold this position for __________ seconds. Repeat __________ times. Complete this exercise __________ times a day. Strengthening exercises These exercises build strength and endurance in your knee. Endurance is the ability to use your muscles for a long time, even after they get tired. Quadriceps, isometric This exercise stretches the muscles in front of your thigh (quadriceps) without moving your knee joint (isometric). 1. Lie on your back with your left / right leg extended and your other knee bent. Put a rolled towel or small pillow under your knee if told by your health care provider. 2. Slowly tense the muscles in the front of your left / right thigh. You should see your kneecap slide up toward your hip or   see increased dimpling just above the knee. This motion will push the back of the knee toward the floor. 3. For __________ seconds, hold the muscle as tight as you can without increasing your pain. 4. Relax the muscles slowly and completely. Repeat __________ times. Complete this exercise __________ times a day. Straight leg raises This exercise stretches the muscles in front of your thigh (quadriceps) and the muscles that move your hips (hip flexors). 1. Lie on your back with  your left / right leg extended and your other knee bent. 2. Tense the muscles in the front of your left / right thigh. You should see your kneecap slide up or see increased dimpling just above the knee. Your thigh may even shake a bit. 3. Keep these muscles tight as you raise your leg 4-6 inches (10-15 cm) off the floor. Do not let your knee bend. 4. Hold this position for __________ seconds. 5. Keep these muscles tense as you lower your leg. 6. Relax your muscles slowly and completely after each repetition. Repeat __________ times. Complete this exercise __________ times a day. Hamstring, isometric 1. Lie on your back on a firm surface. 2. Bend your left / right knee about __________ degrees. 3. Dig your left / right heel into the surface as if you are trying to pull it toward your buttocks. Tighten the muscles in the back of your thighs (hamstring) to "dig" as hard as you can without increasing any pain. 4. Hold this position for __________ seconds. 5. Release the tension gradually and allow your muscles to relax completely for __________ seconds after each repetition. Repeat __________ times. Complete this exercise __________ times a day. Hamstring curls If told by your health care provider, do this exercise while wearing ankle weights. Begin with __________ lb weights. Then increase the weight by 1 lb (0.5 kg) increments. Do not wear ankle weights that are more than __________ lb. 1. Lie on your abdomen with your legs straight. 2. Bend your left / right knee as far as you can without feeling pain. Keep your hips flat against the floor. 3. Hold this position for __________ seconds. 4. Slowly lower your leg to the starting position. Repeat __________ times. Complete this exercise __________ times a day. Squats This exercise strengthens the muscles in front of your thigh and knee (quadriceps). 1. Stand in front of a table, with your feet and knees pointing straight ahead. You may rest your  hands on the table for balance but not for support. 2. Slowly bend your knees and lower your hips like you are going to sit in a chair. ? Keep your weight over your heels, not over your toes. ? Keep your lower legs upright so they are parallel with the table legs. ? Do not let your hips go lower than your knees. ? Do not bend lower than told by your health care provider. ? If your knee pain increases, do not bend as low. 3. Hold the squat position for __________ seconds. 4. Slowly push with your legs to return to standing. Do not use your hands to pull yourself to standing. Repeat __________ times. Complete this exercise __________ times a day. Wall slides This exercise strengthens the muscles in front of your thigh and knee (quadriceps). 1. Lean your back against a smooth wall or door, and walk your feet out 18-24 inches (46-61 cm) from it. 2. Place your feet hip-width apart. 3. Slowly slide down the wall or door until your knees bend __________ degrees.   Keep your knees over your heels, not over your toes. Keep your knees in line with your hips. 4. Hold this position for __________ seconds. Repeat __________ times. Complete this exercise __________ times a day. Straight leg raises This exercise strengthens the muscles that rotate the leg at the hip and move it away from your body (hip abductors). 1. Lie on your side with your left / right leg in the top position. Lie so your head, shoulder, knee, and hip line up. You may bend your bottom knee to help you keep your balance. 2. Roll your hips slightly forward so your hips are stacked directly over each other and your left / right knee is facing forward. 3. Leading with your heel, lift your top leg 4-6 inches (10-15 cm). You should feel the muscles in your outer hip lifting. ? Do not let your foot drift forward. ? Do not let your knee roll toward the ceiling. 4. Hold this position for __________ seconds. 5. Slowly return your leg to the  starting position. 6. Let your muscles relax completely after each repetition. Repeat __________ times. Complete this exercise __________ times a day. Straight leg raises This exercise stretches the muscles that move your hips away from the front of the pelvis (hip extensors). 1. Lie on your abdomen on a firm surface. You can put a pillow under your hips if that is more comfortable. 2. Tense the muscles in your buttocks and lift your left / right leg about 4-6 inches (10-15 cm). Keep your knee straight as you lift your leg. 3. Hold this position for __________ seconds. 4. Slowly lower your leg to the starting position. 5. Let your leg relax completely after each repetition. Repeat __________ times. Complete this exercise __________ times a day. This information is not intended to replace advice given to you by your health care provider. Make sure you discuss any questions you have with your health care provider. Document Revised: 10/05/2018 Document Reviewed: 10/05/2018 Elsevier Patient Education  2020 [employer]  

## 2020-12-22 LAB — SEDIMENTATION RATE: Sed Rate: 14 mm/h (ref 0–20)

## 2020-12-22 LAB — CYCLIC CITRUL PEPTIDE ANTIBODY, IGG: Cyclic Citrullin Peptide Ab: <16 UNITS

## 2020-12-22 LAB — 14-3-3 ETA PROTEIN: 14-3-3 eta Protein: <0.2 ng/mL (ref <0.2)

## 2020-12-24 NOTE — Progress Notes (Signed)
Will discuss results at the follow-up visit.  All the labs are within normal limits.

## 2020-12-29 NOTE — Progress Notes (Deleted)
Office Visit Note  Patient: Susan Shields             Date of Birth: 1981/04/25           MRN: [id number]             PCP: Donald Prose, MD Referring: Donald Prose, MD Visit Date: 01/08/2021 Occupation: '@GUAROCC' @  Subjective:  No chief complaint on file.   History of Present Illness: Susan Shields is a 40 y.o. female ***   Activities of Daily Living:  Patient reports morning stiffness for *** {minute/hour:19697}.   Patient {ACTIONS;DENIES/REPORTS:21021675::"Denies"} nocturnal pain.  Difficulty dressing/grooming: {ACTIONS;DENIES/REPORTS:21021675::"Denies"} Difficulty climbing stairs: {ACTIONS;DENIES/REPORTS:21021675::"Denies"} Difficulty getting out of chair: {ACTIONS;DENIES/REPORTS:21021675::"Denies"} Difficulty using hands for taps, buttons, cutlery, and/or writing: {ACTIONS;DENIES/REPORTS:21021675::"Denies"}  No Rheumatology ROS completed.   PMFS History:  Patient Active Problem List   Diagnosis Date Noted  . Prediabetes 11/26/2019  . Vitamin D deficiency 11/26/2019  . Insulin resistance 12/07/2018  . Other hyperlipidemia 11/01/2018  . Other specified hypothyroidism 11/01/2018  . Class 1 obesity with serious comorbidity and body mass index (BMI) of 34.0 to 34.9 in adult 11/01/2018  . Slow transit constipation 10/11/2014    Past Medical History:  Diagnosis Date  . Constipation   . Graves disease   . Hyperlipidemia   . IBS (irritable bowel syndrome)   . Thyroid disease   . Vitamin D deficiency     Family History  Problem Relation Age of Onset  . Diabetes Mother   . Cancer Mother        MMT Cancer  . Obesity Mother   . Throat cancer Father   . Sudden death Father   . Stroke Father   . Healthy Sister   . Healthy Brother   . Healthy Sister   . Healthy Brother   . Healthy Brother   . Healthy Son    Past Surgical History:  Procedure Laterality Date  . CESAREAN SECTION    . COLONOSCOPY     Social History   Social History Narrative  . Not on file    Immunization History  Administered Date(s) Administered  . PFIZER SARS-COV-2 Vaccination 01/13/2020, 02/03/2020     Objective: Vital Signs: There were no vitals taken for this visit.   Physical Exam   Musculoskeletal Exam: ***  CDAI Exam: CDAI Score: - Patient Global: -; Provider Global: - Swollen: -; Tender: - Joint Exam 01/08/2021   No joint exam has been documented for this visit   There is currently no information documented on the homunculus. Go to the Rheumatology activity and complete the homunculus joint exam.  Investigation: No additional findings.  Imaging: XR Hand 2 View Left  Result Date: 12/17/2020 CMC, PIP and DIP narrowing was noted.  No MCP, intercarpal or radiocarpal joint space narrowing was noted.  No erosive changes were noted. Impression: These findings are consistent with osteoarthritis of the hand.  XR Hand 2 View Right  Result Date: 12/17/2020 CMC, PIP and DIP narrowing was noted.  No MCP, intercarpal or radiocarpal joint space narrowing was noted.  No erosive changes were noted. Impression: These findings are consistent with osteoarthritis of the hand.  XR KNEE 3 VIEW LEFT  Result Date: 12/17/2020 No medial lateral compartment narrowing was noted.  Mild patellofemoral narrowing was noted.  No chondrocalcinosis was noted. 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   Recent Labs: Lab Results  Component Value Date   WBC 6.4 05/07/2020   HGB 13.3 05/07/2020  PLT 240 05/07/2020   NA 142 05/07/2020   K 4.1 05/07/2020   CL 105 05/07/2020   CO2 22 05/07/2020   GLUCOSE 77 05/07/2020   BUN 7 05/07/2020   CREATININE 1.07 (H) 05/07/2020   BILITOT 0.2 05/07/2020   ALKPHOS 51 05/07/2020   AST 22 05/07/2020   ALT 17 05/07/2020   PROT 7.1 05/07/2020   ALBUMIN 4.1 05/07/2020   CALCIUM 9.2 05/07/2020   GFRAA 75 05/07/2020   December 25, 2020 ESR 14, anti-CCP negative, '14 3 3 ' eta negative   11/28/20: RF<10,  ESR 32, TSH 1.57, vitamin D 39.5, ANA negative   Speciality Comments: No specialty comments available.  Procedures:  No procedures performed Allergies: Simvastatin and Tetanus-diphth-acell pertussis   Assessment / Plan:     Visit Diagnoses: No diagnosis found.  Orders: No orders of the defined types were placed in this encounter.  No orders of the defined types were placed in this encounter.   Face-to-face time spent with patient was *** minutes. Greater than 50% of time was spent in counseling and coordination of care.  Follow-Up Instructions: No follow-ups on file.   Bo Merino, MD  Note - This record has been created using Editor, commissioning.  Chart creation errors have been sought, but may not always  have been located. Such creation errors do not reflect on  the standard of medical care.

## 2020-12-31 ENCOUNTER — Ambulatory Visit: Payer: No Typology Code available for payment source | Admitting: Gastroenterology

## 2020-12-31 ENCOUNTER — Telehealth: Payer: Self-pay | Admitting: Gastroenterology

## 2020-12-31 NOTE — Telephone Encounter (Signed)
Patient called and went ahead and rescheduled appointment but is still requesting a call back please.

## 2020-12-31 NOTE — Telephone Encounter (Signed)
Patient expressed that she has been exposed to someone that potentially has covid and is getting tested today. Patient states that is the reason she was unable to come in today for her appt. Informed patient that I was not aware because the message did not express the reason why she wanted her appt to be virtual. Also, that I did see her message after her appt time. Asked patient if she has enough refills to last her next appt. Patient states she does.

## 2020-12-31 NOTE — Telephone Encounter (Signed)
Pt is wanting to know if her appt scheduled for today @9 :30am could be a virtual visit instead.

## 2020-12-31 NOTE — Telephone Encounter (Signed)
Patient will have to reschedule. Didn't see message until now. This should have been a call directly to me. Thanks.

## 2021-01-02 ENCOUNTER — Other Ambulatory Visit: Payer: No Typology Code available for payment source | Admitting: Rheumatology

## 2021-01-03 MED FILL — LEVOTHYROXINE 125 MCG TABLE: 125 | 90 days supply | Qty: 90 | Fill #0

## 2021-01-08 ENCOUNTER — Ambulatory Visit: Payer: No Typology Code available for payment source | Admitting: Rheumatology

## 2021-01-08 ENCOUNTER — Other Ambulatory Visit (HOSPITAL_COMMUNITY): Payer: Self-pay | Admitting: Family Medicine

## 2021-01-08 DIAGNOSIS — R059 Cough, unspecified: Secondary | ICD-10-CM | POA: Diagnosis not present

## 2021-01-08 DIAGNOSIS — M222X2 Patellofemoral disorders, left knee: Secondary | ICD-10-CM

## 2021-01-08 DIAGNOSIS — Z8719 Personal history of other diseases of the digestive system: Secondary | ICD-10-CM

## 2021-01-08 DIAGNOSIS — E7800 Pure hypercholesterolemia, unspecified: Secondary | ICD-10-CM

## 2021-01-08 DIAGNOSIS — Z8639 Personal history of other endocrine, nutritional and metabolic disease: Secondary | ICD-10-CM

## 2021-01-08 DIAGNOSIS — E559 Vitamin D deficiency, unspecified: Secondary | ICD-10-CM

## 2021-01-08 DIAGNOSIS — U071 COVID-19: Secondary | ICD-10-CM | POA: Diagnosis not present

## 2021-01-08 DIAGNOSIS — R202 Paresthesia of skin: Secondary | ICD-10-CM

## 2021-01-08 DIAGNOSIS — M19041 Primary osteoarthritis, right hand: Secondary | ICD-10-CM

## 2021-01-08 DIAGNOSIS — Z6836 Body mass index (BMI) 36.0-36.9, adult: Secondary | ICD-10-CM

## 2021-01-08 MED FILL — HYDROCODONE-HOMATROPINE SYR: 5-1.5 | 5 days supply | Qty: 100 | Fill #0

## 2021-01-08 MED FILL — ALBUTEROL SULFATE HFA 108 (: 108 (90 BAS | 33 days supply | Qty: 18 | Fill #0

## 2021-01-09 NOTE — Progress Notes (Signed)
Office Visit Note  Patient: Susan Shields             Date of Birth: [birth year]           MRN: [id number]             PCP: Donald Prose, MD Referring: Donald Prose, MD Visit Date: 01/16/2021 Occupation: '@GUAROCC' @  Subjective:  Pain and tingling in both hands..   History of Present Illness: Susan Shields is a 40 y.o. female with history of pain in multiple joints.  She states she continues to have pain and discomfort in her bilateral hands.  She also has discomfort in her left knee joint.  She denies any joint swelling.  She states she has tingling in her bilateral hands.  The tingling gets worse when she is driving.  She also complains of discomfort in her right CMC joint.  Activities of Daily Living:  Patient reports morning stiffness for 0 minutes.   Patient Denies nocturnal pain.  Difficulty dressing/grooming: Denies Difficulty climbing stairs: Reports Difficulty getting out of chair: Denies Difficulty using hands for taps, buttons, cutlery, and/or writing: Reports  Review of Systems  Constitutional: Negative for fatigue.  HENT: Negative for mouth sores, mouth dryness and nose dryness.   Eyes: Negative for pain, itching and dryness.  Respiratory: Positive for cough. Negative for shortness of breath and difficulty breathing.   Cardiovascular: Negative for chest pain and palpitations.  Gastrointestinal: Negative for blood in stool, constipation and diarrhea.  Endocrine: Negative for increased urination.  Genitourinary: Negative for difficulty urinating.  Musculoskeletal: Positive for arthralgias, joint pain and joint swelling. Negative for myalgias, morning stiffness, muscle tenderness and myalgias.  Skin: Negative for color change, rash and redness.  Allergic/Immunologic: Negative for susceptible to infections.  Neurological: Positive for numbness and headaches. Negative for dizziness, memory loss and weakness.  Hematological: Negative for bruising/bleeding tendency.   Psychiatric/Behavioral: Negative for confusion.    PMFS History:  Patient Active Problem List   Diagnosis Date Noted  . Prediabetes 11/26/2019  . Vitamin D deficiency 11/26/2019  . Insulin resistance 12/07/2018  . Other hyperlipidemia 11/01/2018  . Other specified hypothyroidism 11/01/2018  . Class 1 obesity with serious comorbidity and body mass index (BMI) of 34.0 to 34.9 in adult 11/01/2018  . Slow transit constipation 10/11/2014    Past Medical History:  Diagnosis Date  . Constipation   . Graves disease   . Hyperlipidemia   . IBS (irritable bowel syndrome)   . Thyroid disease   . Vitamin D deficiency     Family History  Problem Relation Age of Onset  . Diabetes Mother   . Cancer Mother        MMT Cancer  . Obesity Mother   . Throat cancer Father   . Sudden death Father   . Stroke Father   . Healthy Sister   . Healthy Brother   . Healthy Sister   . Healthy Brother   . Healthy Brother   . Healthy Son    Past Surgical History:  Procedure Laterality Date  . CESAREAN SECTION    . COLONOSCOPY     Social History   Social History Narrative  . Not on file   Immunization History  Administered Date(s) Administered  . PFIZER(Purple Top)SARS-COV-2 Vaccination 01/13/2020, 02/03/2020     Objective: Vital Signs: BP 128/89 (BP Location: Left Arm, Patient Position: Sitting, Cuff Size: Normal)   Pulse 84   Resp 16   Ht '5\' 4"'  (1.626 m)  Wt 222 lb 6.4 oz (100.9 kg)   BMI 38.17 kg/m    Physical Exam Vitals and nursing note reviewed.  Constitutional:      Appearance: She is well-developed and well-nourished.  HENT:     Head: Normocephalic and atraumatic.  Eyes:     Extraocular Movements: EOM normal.     Conjunctiva/sclera: Conjunctivae normal.  Cardiovascular:     Rate and Rhythm: Normal rate and regular rhythm.     Pulses: Intact distal pulses.     Heart sounds: Normal heart sounds.  Pulmonary:     Effort: Pulmonary effort is normal.     Breath sounds:  Normal breath sounds.  Abdominal:     General: Bowel sounds are normal.     Palpations: Abdomen is soft.  Musculoskeletal:     Cervical back: Normal range of motion.  Lymphadenopathy:     Cervical: No cervical adenopathy.  Skin:    General: Skin is warm and dry.     Capillary Refill: Capillary refill takes less than 2 seconds.  Neurological:     Mental Status: She is alert and oriented to person, place, and time.  Psychiatric:        Mood and Affect: Mood and affect normal.        Behavior: Behavior normal.      Musculoskeletal Exam: C-spine was in good range of motion.  Shoulder joints, elbow joints, wrist joints, MCPs PIPs and DIPs with good range of motion.  She has some tenderness over right CMC joint.  Elbow joints, wrist joints, MTPs and PIPs with good range of motion with no synovitis.  She had no tenderness over ankle joints.  CDAI Exam: CDAI Score: -- Patient Global: --; Provider Global: -- Swollen: --; Tender: -- Joint Exam 01/16/2021   No joint exam has been documented for this visit   There is currently no information documented on the homunculus. Go to the Rheumatology activity and complete the homunculus joint exam.  Investigation: No additional findings.  Imaging: [place] COMPLETE JOINT SPACE STRUCTURES UP BILAT  Result Date: 01/16/2021 Ultrasound examination of the bilateral hands was performed per EULAR recommendations. Using a 15 MHz transducer, grayscale and power Doppler bilateral second, third, and fifth MCP joints and bilateral wrist joints both dorsal and volar aspects were evaluated to look for synovitis or tenosynovitis. The findings were there was no synovitis or tenosynovitis on ultrasound examination. Right median nerve was 0.07 cm squares which was within normal limits and left median nerve was 0.08 cm squares which was within normal limits. Impression: Ultrasound examination of the bilateral hands did not show any synovitis.  Bilateral median nerves were  within normal limits.   Recent Labs: Lab Results  Component Value Date   WBC 6.4 05/07/2020   HGB 13.3 05/07/2020   PLT 240 05/07/2020   NA 142 05/07/2020   K 4.1 05/07/2020   CL 105 05/07/2020   CO2 22 05/07/2020   GLUCOSE 77 05/07/2020   BUN 7 05/07/2020   CREATININE 1.07 (H) 05/07/2020   BILITOT 0.2 05/07/2020   ALKPHOS 51 05/07/2020   AST 22 05/07/2020   ALT 17 05/07/2020   PROT 7.1 05/07/2020   ALBUMIN 4.1 05/07/2020   CALCIUM 9.2 05/07/2020   GFRAA 75 05/07/2020   December 25, 2020 ESR 14, anti-CCP negative, '14 3 3 ' eta negative  11/28/20: RF<10, ESR 32, TSH 1.57, vitamin D 39.5, ANA negative   Speciality Comments: No specialty comments available.  Procedures:  No procedures performed Allergies:  Simvastatin and Tetanus-diphth-acell pertussis   Assessment / Plan:     Visit Diagnoses: Primary osteoarthritis of both hands - Clinical and radiographic findings are consistent with osteoarthritis.  All autoimmune labs were negative.  Left findings were discussed at length with the patient.  Detailed counseling on osteoarthritis was provided.  Joint protection muscle strengthening was discussed.  Exercises were demonstrated in the office and a handout was given.  She has been also having discomfort over right CMC joint.  A prescription for right CMC brace was given.  I obtain ultrasound of bilateral hands today.  Which was negative for synovitis.  Bilateral median nerves were within normal limits.  Paresthesia of both hands -she has been experiencing paresthesias in her bilateral hands.  Phalen's and Tinel's are negative although she has been having numbness in her both hands when she is driving.  These findings are consistent with carpal tunnel syndrome.  Have given her a prescription for bilateral carpal tunnel braces.  I also discussed use of topical diclofenac gel.  If her symptoms persist we may consider nerve conduction velocities.  Chondromalacia patellae, left knee - Mild  chondromalacia was noted on the x-ray.  Patient is noted improvement on meloxicam.  Weight loss diet and exercise was discussed.  Muscle strengthening exercises were discussed.  A handout was given.  Natural anti-inflammatories was discussed.  History of Graves' disease  Hypercholesterolemia  History of IBS-she has history of IBS and is followed by gastroenterology.  Vitamin D deficiency  Body mass index (BMI) of 36.0 to 36.9-weight loss diet and exercise was emphasized.  Orders: Orders Placed This Encounter  Procedures  . [place] COMPLETE JOINT SPACE STRUCTURES UP BILAT   No orders of the defined types were placed in this encounter.     Follow-Up Instructions: Return in about 3 months (around 04/16/2021).   Bo Merino, MD  Note - This record has been created using Editor, commissioning.  Chart creation errors have been sought, but may not always  have been located. Such creation errors do not reflect on  the standard of medical care.

## 2021-01-16 ENCOUNTER — Other Ambulatory Visit: Payer: Self-pay

## 2021-01-16 ENCOUNTER — Ambulatory Visit: Payer: 59 | Admitting: Rheumatology

## 2021-01-16 ENCOUNTER — Ambulatory Visit: Payer: Self-pay

## 2021-01-16 ENCOUNTER — Encounter: Payer: Self-pay | Admitting: Rheumatology

## 2021-01-16 VITALS — BP 128/89 | HR 84 | Resp 16 | Ht 64.0 in | Wt 222.4 lb

## 2021-01-16 DIAGNOSIS — M19041 Primary osteoarthritis, right hand: Secondary | ICD-10-CM

## 2021-01-16 DIAGNOSIS — R202 Paresthesia of skin: Secondary | ICD-10-CM | POA: Diagnosis not present

## 2021-01-16 DIAGNOSIS — M19042 Primary osteoarthritis, left hand: Secondary | ICD-10-CM

## 2021-01-16 DIAGNOSIS — Z8719 Personal history of other diseases of the digestive system: Secondary | ICD-10-CM

## 2021-01-16 DIAGNOSIS — Z8639 Personal history of other endocrine, nutritional and metabolic disease: Secondary | ICD-10-CM

## 2021-01-16 DIAGNOSIS — M2242 Chondromalacia patellae, left knee: Secondary | ICD-10-CM | POA: Diagnosis not present

## 2021-01-16 DIAGNOSIS — Z6836 Body mass index (BMI) 36.0-36.9, adult: Secondary | ICD-10-CM | POA: Diagnosis not present

## 2021-01-16 DIAGNOSIS — E559 Vitamin D deficiency, unspecified: Secondary | ICD-10-CM | POA: Diagnosis not present

## 2021-01-16 DIAGNOSIS — E7800 Pure hypercholesterolemia, unspecified: Secondary | ICD-10-CM | POA: Diagnosis not present

## 2021-01-16 NOTE — Patient Instructions (Signed)
Journal for Nurse Practitioners, 15(4), 263-267. Retrieved October 04, 2018 from [email or web address].[email or web address]">  Knee Exercises Ask your health care provider which exercises are safe for you. Do exercises exactly as told by your health care provider and adjust them as directed. It is normal to feel mild stretching, pulling, tightness, or discomfort as you do these exercises. Stop right away if you feel sudden pain or your pain gets worse. Do not begin these exercises until told by your health care provider. Stretching and range-of-motion exercises These exercises warm up your muscles and joints and improve the movement and flexibility of your knee. These exercises also help to relieve pain and swelling. Knee extension, prone 1. Lie on your abdomen (prone position) on a bed. 2. Place your left / right knee just beyond the edge of the surface so your knee is not on the bed. You can put a towel under your left / right thigh just above your kneecap for comfort. 3. Relax your leg muscles and allow gravity to straighten your knee (extension). You should feel a stretch behind your left / right knee. 4. Hold this position for __________ seconds. 5. Scoot up so your knee is supported between repetitions. Repeat __________ times. Complete this exercise __________ times a day. Knee flexion, active 1. Lie on your back with both legs straight. If this causes back discomfort, bend your left / right knee so your foot is flat on the floor. 2. Slowly slide your left / right heel back toward your buttocks. Stop when you feel a gentle stretch in the front of your knee or thigh (flexion). 3. Hold this position for __________ seconds. 4. Slowly slide your left / right heel back to the starting position. Repeat __________ times. Complete this exercise __________ times a day.   Quadriceps stretch, prone 1. Lie on your abdomen on a firm surface, such as a bed or padded floor. 2. Bend your left / right knee and hold  your ankle. If you cannot reach your ankle or pant leg, loop a belt around your foot and grab the belt instead. 3. Gently pull your heel toward your buttocks. Your knee should not slide out to the side. You should feel a stretch in the front of your thigh and knee (quadriceps). 4. Hold this position for __________ seconds. Repeat __________ times. Complete this exercise __________ times a day.   Hamstring, supine 1. Lie on your back (supine position). 2. Loop a belt or towel over the ball of your left / right foot. The ball of your foot is on the walking surface, right under your toes. 3. Straighten your left / right knee and slowly pull on the belt to raise your leg until you feel a gentle stretch behind your knee (hamstring). ? Do not let your knee bend while you do this. ? Keep your other leg flat on the floor. 4. Hold this position for __________ seconds. Repeat __________ times. Complete this exercise __________ times a day. Strengthening exercises These exercises build strength and endurance in your knee. Endurance is the ability to use your muscles for a long time, even after they get tired. Quadriceps, isometric This exercise stretches the muscles in front of your thigh (quadriceps) without moving your knee joint (isometric). 1. Lie on your back with your left / right leg extended and your other knee bent. Put a rolled towel or small pillow under your knee if told by your health care provider. 2. Slowly tense the muscles in the front of your   left / right thigh. You should see your kneecap slide up toward your hip or see increased dimpling just above the knee. This motion will push the back of the knee toward the floor. 3. For __________ seconds, hold the muscle as tight as you can without increasing your pain. 4. Relax the muscles slowly and completely. Repeat __________ times. Complete this exercise __________ times a day.   Straight leg raises This exercise stretches the muscles in  front of your thigh (quadriceps) and the muscles that move your hips (hip flexors). 1. Lie on your back with your left / right leg extended and your other knee bent. 2. Tense the muscles in the front of your left / right thigh. You should see your kneecap slide up or see increased dimpling just above the knee. Your thigh may even shake a bit. 3. Keep these muscles tight as you raise your leg 4-6 inches (10-15 cm) off the floor. Do not let your knee bend. 4. Hold this position for __________ seconds. 5. Keep these muscles tense as you lower your leg. 6. Relax your muscles slowly and completely after each repetition. Repeat __________ times. Complete this exercise __________ times a day. Hamstring, isometric 1. Lie on your back on a firm surface. 2. Bend your left / right knee about __________ degrees. 3. Dig your left / right heel into the surface as if you are trying to pull it toward your buttocks. Tighten the muscles in the back of your thighs (hamstring) to "dig" as hard as you can without increasing any pain. 4. Hold this position for __________ seconds. 5. Release the tension gradually and allow your muscles to relax completely for __________ seconds after each repetition. Repeat __________ times. Complete this exercise __________ times a day. Hamstring curls If told by your health care provider, do this exercise while wearing ankle weights. Begin with __________ lb weights. Then increase the weight by 1 lb (0.5 kg) increments. Do not wear ankle weights that are more than __________ lb. 1. Lie on your abdomen with your legs straight. 2. Bend your left / right knee as far as you can without feeling pain. Keep your hips flat against the floor. 3. Hold this position for __________ seconds. 4. Slowly lower your leg to the starting position. Repeat __________ times. Complete this exercise __________ times a day.   Squats This exercise strengthens the muscles in front of your thigh and knee  (quadriceps). 1. Stand in front of a table, with your feet and knees pointing straight ahead. You may rest your hands on the table for balance but not for support. 2. Slowly bend your knees and lower your hips like you are going to sit in a chair. ? Keep your weight over your heels, not over your toes. ? Keep your lower legs upright so they are parallel with the table legs. ? Do not let your hips go lower than your knees. ? Do not bend lower than told by your health care provider. ? If your knee pain increases, do not bend as low. 3. Hold the squat position for __________ seconds. 4. Slowly push with your legs to return to standing. Do not use your hands to pull yourself to standing. Repeat __________ times. Complete this exercise __________ times a day. Wall slides This exercise strengthens the muscles in front of your thigh and knee (quadriceps). 1. Lean your back against a smooth wall or door, and walk your feet out 18-24 inches (46-61 cm) from it. 2.   Place your feet hip-width apart. 3. Slowly slide down the wall or door until your knees bend __________ degrees. Keep your knees over your heels, not over your toes. Keep your knees in line with your hips. 4. Hold this position for __________ seconds. Repeat __________ times. Complete this exercise __________ times a day.   Straight leg raises This exercise strengthens the muscles that rotate the leg at the hip and move it away from your body (hip abductors). 1. Lie on your side with your left / right leg in the top position. Lie so your head, shoulder, knee, and hip line up. You may bend your bottom knee to help you keep your balance. 2. Roll your hips slightly forward so your hips are stacked directly over each other and your left / right knee is facing forward. 3. Leading with your heel, lift your top leg 4-6 inches (10-15 cm). You should feel the muscles in your outer hip lifting. ? Do not let your foot drift forward. ? Do not let your  knee roll toward the ceiling. 4. Hold this position for __________ seconds. 5. Slowly return your leg to the starting position. 6. Let your muscles relax completely after each repetition. Repeat __________ times. Complete this exercise __________ times a day.   Straight leg raises This exercise stretches the muscles that move your hips away from the front of the pelvis (hip extensors). 1. Lie on your abdomen on a firm surface. You can put a pillow under your hips if that is more comfortable. 2. Tense the muscles in your buttocks and lift your left / right leg about 4-6 inches (10-15 cm). Keep your knee straight as you lift your leg. 3. Hold this position for __________ seconds. 4. Slowly lower your leg to the starting position. 5. Let your leg relax completely after each repetition. Repeat __________ times. Complete this exercise __________ times a day. This information is not intended to replace advice given to you by your health care provider. Make sure you discuss any questions you have with your health care provider. Document Revised: 10/05/2018 Document Reviewed: 10/05/2018 Else[employer] Patient Education  2021 [employer] Hand Exercises Hand exercises can be helpful for almost anyone. These exercises can strengthen the hands, improve flexibility and movement, and increase blood flow to the hands. These results can make work and daily tasks easier. Hand exercises can be especially helpful for people who have joint pain from arthritis or have nerve damage from overuse (carpal tunnel syndrome). These exercises can also help people who have injured a hand. Exercises Most of these hand exercises are gentle stretching and motion exercises. It is usually safe to do them often throughout the day. Warming up your hands before exercise may help to reduce stiffness. You can do this with gentle massage or by placing your hands in warm water for 10-15 minutes. It is normal to feel some stretching,  pulling, tightness, or mild discomfort as you begin new exercises. This will gradually improve. Stop an exercise right away if you feel sudden, severe pain or your pain gets worse. Ask your health care provider which exercises are best for you. Knuckle bend or "claw" fist 1. Stand or sit with your arm, hand, and all five fingers pointed straight up. Make sure to keep your wrist straight during the exercise. 2. Gently bend your fingers down toward your palm until the tips of your fingers are touching the top of your palm. Keep your big knuckle straight and just bend the   small knuckles in your fingers. 3. Hold this position for __________ seconds. 4. Straighten (extend) your fingers back to the starting position. Repeat this exercise 5-10 times with each hand. Full finger fist 1. Stand or sit with your arm, hand, and all five fingers pointed straight up. Make sure to keep your wrist straight during the exercise. 2. Gently bend your fingers into your palm until the tips of your fingers are touching the middle of your palm. 3. Hold this position for __________ seconds. 4. Extend your fingers back to the starting position, stretching every joint fully. Repeat this exercise 5-10 times with each hand. Straight fist 1. Stand or sit with your arm, hand, and all five fingers pointed straight up. Make sure to keep your wrist straight during the exercise. 2. Gently bend your fingers at the big knuckle, where your fingers meet your hand, and the middle knuckle. Keep the knuckle at the tips of your fingers straight and try to touch the bottom of your palm. 3. Hold this position for __________ seconds. 4. Extend your fingers back to the starting position, stretching every joint fully. Repeat this exercise 5-10 times with each hand. Tabletop 1. Stand or sit with your arm, hand, and all five fingers pointed straight up. Make sure to keep your wrist straight during the exercise. 2. Gently bend your fingers at the  big knuckle, where your fingers meet your hand, as far down as you can while keeping the small knuckles in your fingers straight. Think of forming a tabletop with your fingers. 3. Hold this position for __________ seconds. 4. Extend your fingers back to the starting position, stretching every joint fully. Repeat this exercise 5-10 times with each hand. Finger spread 1. Place your hand flat on a table with your palm facing down. Make sure your wrist stays straight as you do this exercise. 2. Spread your fingers and thumb apart from each other as far as you can until you feel a gentle stretch. Hold this position for __________ seconds. 3. Bring your fingers and thumb tight together again. Hold this position for __________ seconds. Repeat this exercise 5-10 times with each hand. Making circles 1. Stand or sit with your arm, hand, and all five fingers pointed straight up. Make sure to keep your wrist straight during the exercise. 2. Make a circle by touching the tip of your thumb to the tip of your index finger. 3. Hold for __________ seconds. Then open your hand wide. 4. Repeat this motion with your thumb and each finger on your hand. Repeat this exercise 5-10 times with each hand. Thumb motion 1. Sit with your forearm resting on a table and your wrist straight. Your thumb should be facing up toward the ceiling. Keep your fingers relaxed as you move your thumb. 2. Lift your thumb up as high as you can toward the ceiling. Hold for __________ seconds. 3. Bend your thumb across your palm as far as you can, reaching the tip of your thumb for the small finger (pinkie) side of your palm. Hold for __________ seconds. Repeat this exercise 5-10 times with each hand. Grip strengthening 1. Hold a stress ball or other soft ball in the middle of your hand. 2. Slowly increase the pressure, squeezing the ball as much as you can without causing pain. Think of bringing the tips of your fingers into the middle of  your palm. All of your finger joints should bend when doing this exercise. 3. Hold your squeeze for __________ seconds,   then relax. Repeat this exercise 5-10 times with each hand.   Contact a health care provider if:  Your hand pain or discomfort gets much worse when you do an exercise.  Your hand pain or discomfort does not improve within 2 hours after you exercise. If you have any of these problems, stop doing these exercises right away. Do not do them again unless your health care provider says that you can. Get help right away if:  You develop sudden, severe hand pain or swelling. If this happens, stop doing these exercises right away. Do not do them again unless your health care provider says that you can. This information is not intended to replace advice given to you by your health care provider. Make sure you discuss any questions you have with your health care provider. Document Revised: 04/07/2019 Document Reviewed: 12/16/2018 Elsevier Patient Education  2021 [employer]  

## 2021-01-22 ENCOUNTER — Other Ambulatory Visit: Payer: Self-pay | Admitting: Family Medicine

## 2021-01-22 ENCOUNTER — Other Ambulatory Visit: Payer: Self-pay

## 2021-01-22 ENCOUNTER — Ambulatory Visit
Admission: RE | Admit: 2021-01-22 | Discharge: 2021-01-22 | Disposition: A | Discharge status: Home / Self Care | Payer: 59 | Source: Ambulatory Visit | Attending: Family Medicine | Admitting: Family Medicine

## 2021-01-22 DIAGNOSIS — J1282 Pneumonia due to coronavirus disease 2019: Secondary | ICD-10-CM

## 2021-01-22 DIAGNOSIS — U071 COVID-19: Secondary | ICD-10-CM

## 2021-01-22 DIAGNOSIS — R0602 Shortness of breath: Secondary | ICD-10-CM | POA: Diagnosis not present

## 2021-01-22 DIAGNOSIS — R059 Cough, unspecified: Secondary | ICD-10-CM | POA: Diagnosis not present

## 2021-01-24 ENCOUNTER — Other Ambulatory Visit: Payer: Self-pay

## 2021-01-24 ENCOUNTER — Ambulatory Visit: Payer: 59 | Admitting: Gastroenterology

## 2021-01-24 ENCOUNTER — Encounter: Payer: Self-pay | Admitting: Gastroenterology

## 2021-01-24 ENCOUNTER — Other Ambulatory Visit: Payer: Self-pay | Admitting: Gastroenterology

## 2021-01-24 VITALS — BP 130/70 | HR 78 | Ht 64.0 in | Wt 223.0 lb

## 2021-01-24 DIAGNOSIS — Z1211 Encounter for screening for malignant neoplasm of colon: Secondary | ICD-10-CM | POA: Diagnosis not present

## 2021-01-24 DIAGNOSIS — Z1212 Encounter for screening for malignant neoplasm of rectum: Secondary | ICD-10-CM | POA: Diagnosis not present

## 2021-01-24 DIAGNOSIS — K5904 Chronic idiopathic constipation: Secondary | ICD-10-CM | POA: Diagnosis not present

## 2021-01-24 MED ORDER — LINACLOTIDE 290 MCG PO CAPS: ORAL_CAPSULE | ORAL | 3 refills | Status: DC

## 2021-01-24 NOTE — Patient Instructions (Signed)
We have sent the following medications to your pharmacy for you to pick up at your convenience: Linzess 290 mcg daily.   Normal BMI (Body Mass Index- based on height and weight) is between 19 and 25. Your BMI today is Body mass index is 38.28 kg/m. [name] Kitchen Please consider follow up  regarding your BMI with your Primary Care Provider.  Thank you for choosing me and [facility].  Venita Lick. Pleas Koch., MD., Clementeen Graham

## 2021-01-24 NOTE — Progress Notes (Signed)
    History of Present Illness: This is a 40 year old female with chronic constipation.  She is doing well on a regimen of Linzess and Dulcolax in the evening.  She generally has a bowel movement every morning.  She recently had COVID-19 which was managed at home.  She notes some abdominal bloating and a slight weight gain over the past few weeks.  No other GI complaints  Current Medications, Allergies, Past Medical History, Past Surgical History, Family History and Social History were reviewed in [employer] record.   Physical Exam: General: Well developed, well nourished, no acute distress Head: Normocephalic and atraumatic Eyes:  sclerae anicteric, EOMI Ears: Normal auditory acuity Mouth: Not examined, mask on during Covid-19 pandemic Lungs: Clear throughout to auscultation Heart: Regular rate and rhythm; no murmurs, rubs or bruits Abdomen: Soft, non tender and non distended. No masses, hepatosplenomegaly or hernias noted. Normal Bowel sounds Rectal: Not done Musculoskeletal: Symmetrical with no gross deformities  Pulses:  Normal pulses noted Extremities: No clubbing, cyanosis, edema or deformities noted Neurological: Alert oriented x 4, grossly nonfocal Psychological:  Alert and cooperative. Normal mood and affect   Assessment and Recommendations:  1. CIC.  Continue Linzess 290 mcg qd and Dulcolax 1 po qd.  Recommended using an additional Dulcolax tablet over the weekend as her abdominal bloating could be related to incomplete evacuation.  If this is effective she may use an additional Dulcolax tablet as needed.  Maintain adequate hydration with at least 6 glasses of water daily.  REV in 1 year.   2. CRC screening, average risk. Recommend screening colonoscopy at age 7, in January 2026.

## 2021-01-28 ENCOUNTER — Ambulatory Visit: Payer: Self-pay

## 2021-02-01 ENCOUNTER — Ambulatory Visit: Payer: Self-pay

## 2021-02-06 MED FILL — LINZESS 290 MCG CAPSULE: 290 | 90 days supply | Qty: 90 | Fill #0

## 2021-02-06 MED FILL — VIT D2 1.25 MG (50,000 UNIT: 1.25 MG | 84 days supply | Qty: 12 | Fill #0

## 2021-02-27 ENCOUNTER — Other Ambulatory Visit (HOSPITAL_COMMUNITY): Payer: Self-pay | Admitting: Family Medicine

## 2021-02-27 DIAGNOSIS — Z6837 Body mass index (BMI) 37.0-37.9, adult: Secondary | ICD-10-CM | POA: Diagnosis not present

## 2021-02-27 DIAGNOSIS — E6609 Other obesity due to excess calories: Secondary | ICD-10-CM | POA: Diagnosis not present

## 2021-03-07 MED FILL — SAXENDA 18 MG/3 ML PEN: 18 | 30 days supply | Qty: 15 | Fill #0

## 2021-03-28 ENCOUNTER — Ambulatory Visit: Payer: 59

## 2021-03-29 ENCOUNTER — Ambulatory Visit: Payer: 59 | Attending: Internal Medicine

## 2021-03-29 ENCOUNTER — Ambulatory Visit: Payer: 59

## 2021-03-29 DIAGNOSIS — Z23 Encounter for immunization: Secondary | ICD-10-CM

## 2021-03-29 NOTE — Progress Notes (Signed)
   Covid-19 Vaccination Clinic  Name:  Susan Shields    MRN: [id number] DOB: 09/01/1981  03/29/2021  [name] was observed post Covid-19 immunization for 15 minutes without incident. She was provided with Vaccine Information Sheet and instruction to access the V-Safe system.   [name] was instructed to call 911 with any severe reactions post vaccine: [name] Kitchen Difficulty breathing  . Swelling of face and throat  . A fast heartbeat  . A bad rash all over body  . Dizziness and weakness   Immunizations Administered    Name Date Dose VIS Date Route   PFIZER Comrnaty(Gray TOP) Covid-19 Vaccine 03/29/2021  3:25 PM 0.3 mL 12/06/2020 Intramuscular   Manufacturer: [employer], [employer]   Lot: [id number]   NDC: [phone]

## 2021-04-08 ENCOUNTER — Other Ambulatory Visit (HOSPITAL_COMMUNITY): Payer: Self-pay

## 2021-04-08 ENCOUNTER — Other Ambulatory Visit (HOSPITAL_BASED_OUTPATIENT_CLINIC_OR_DEPARTMENT_OTHER): Payer: Self-pay

## 2021-04-08 MED ORDER — PFIZER-BIONT COVID-19 VAC-TRIS 30 MCG/0.3ML IM SUSP
INTRAMUSCULAR | 0 refills | Status: DC
  Filled 2021-04-08: qty 0.3, 1d supply, fill #0

## 2021-04-08 MED FILL — Levothyroxine Sodium Tab 125 MCG: ORAL | 90 days supply | Qty: 90 | Fill #0 | Status: AC

## 2021-04-11 NOTE — Progress Notes (Signed)
Office Visit Note  Patient: Susan Shields             Date of Birth: 1981/06/26           MRN: [id number]             PCP: Deatra James, MD Referring: Deatra James, MD Visit Date: 04/25/2021 Occupation: @GUAROCC @  Subjective:  Pain in hands and knees.   History of Present Illness: Susan Shields is a 40 y.o. female with history of osteoarthritis.  She states she continues to have some stiffness in her hands.  She also has some crepitus in her left knee joint when she is descending down the stairs.  She has not seen any joint swelling.  She continues to have some numbness in her hands.  She states she tried carpal tunnel braces at nighttime and then she gave up on the braces.  Activities of Daily Living:  Patient reports morning stiffness for less than 1 minute.   Patient Denies nocturnal pain.  Difficulty dressing/grooming: Denies Difficulty climbing stairs: Denies Difficulty getting out of chair: Denies Difficulty using hands for taps, buttons, cutlery, and/or writing: Denies  Review of Systems  Constitutional: Negative for fatigue.  HENT: Negative for mouth sores, mouth dryness and nose dryness.   Eyes: Negative for pain, itching and dryness.  Respiratory: Negative for shortness of breath and difficulty breathing.   Cardiovascular: Negative for chest pain and palpitations.  Gastrointestinal: Negative for blood in stool, constipation and diarrhea.  Endocrine: Negative for increased urination.  Genitourinary: Negative for difficulty urinating.  Musculoskeletal: Positive for arthralgias, joint pain and morning stiffness. Negative for joint swelling, myalgias, muscle tenderness and myalgias.  Skin: Negative for color change, rash and redness.  Allergic/Immunologic: Negative for susceptible to infections.  Neurological: Positive for numbness and headaches. Negative for dizziness, memory loss and weakness.  Hematological: Negative for bruising/bleeding tendency.   Psychiatric/Behavioral: Negative for confusion.    PMFS History:  Patient Active Problem List   Diagnosis Date Noted  . Prediabetes 11/26/2019  . Vitamin D deficiency 11/26/2019  . Insulin resistance 12/07/2018  . Other hyperlipidemia 11/01/2018  . Other specified hypothyroidism 11/01/2018  . Class 1 obesity with serious comorbidity and body mass index (BMI) of 34.0 to 34.9 in adult 11/01/2018  . Slow transit constipation 10/11/2014    Past Medical History:  Diagnosis Date  . Constipation   . Graves disease   . Hyperlipidemia   . IBS (irritable bowel syndrome)   . Thyroid disease   . Vitamin D deficiency     Family History  Problem Relation Age of Onset  . Diabetes Mother   . Cancer Mother        MMT Cancer  . Obesity Mother   . Throat cancer Father   . Sudden death Father   . Stroke Father   . Healthy Sister   . Healthy Brother   . Healthy Sister   . Healthy Brother   . Healthy Brother   . Healthy Son    Past Surgical History:  Procedure Laterality Date  . CESAREAN SECTION    . COLONOSCOPY     Social History   Social History Narrative  . Not on file   Immunization History  Administered Date(s) Administered  . PFIZER Comirnaty(Gray Top)Covid-19 Tri-Sucrose Vaccine 03/29/2021  . PFIZER(Purple Top)SARS-COV-2 Vaccination 01/13/2020, 02/03/2020     Objective: Vital Signs: BP 124/88 (BP Location: Left Arm, Patient Position: Sitting, Cuff Size: Normal)   Pulse 85  Ht 5\' 4"  (1.626 m)   Wt 222 lb 3.2 oz (100.8 kg)   BMI 38.14 kg/m    Physical Exam Vitals and nursing note reviewed.  Constitutional:      Appearance: She is well-developed.  HENT:     Head: Normocephalic and atraumatic.  Eyes:     Conjunctiva/sclera: Conjunctivae normal.  Cardiovascular:     Rate and Rhythm: Normal rate and regular rhythm.     Heart sounds: Normal heart sounds.  Pulmonary:     Effort: Pulmonary effort is normal.     Breath sounds: Normal breath sounds.  Abdominal:      General: Bowel sounds are normal.     Palpations: Abdomen is soft.  Musculoskeletal:     Cervical back: Normal range of motion.  Lymphadenopathy:     Cervical: No cervical adenopathy.  Skin:    General: Skin is warm and dry.     Capillary Refill: Capillary refill takes less than 2 seconds.  Neurological:     Mental Status: She is alert and oriented to person, place, and time.  Psychiatric:        Behavior: Behavior normal.      Musculoskeletal Exam: C-spine was in good range of motion.  Shoulder joints, elbow joints, wrist joints, MCPs PIPs and DIPs with good range of motion with no synovitis.  Hip joints, knee joints, ankles, MTPs and PIPs with good range of motion no synovitis.  She has some crepitus in her left knee joint.  CDAI Exam: CDAI Score: -- Patient Global: --; Provider Global: -- Swollen: --; Tender: -- Joint Exam 04/25/2021   No joint exam has been documented for this visit   There is currently no information documented on the homunculus. Go to the Rheumatology activity and complete the homunculus joint exam.  Investigation: No additional findings.  Imaging: No results found.  Recent Labs: Lab Results  Component Value Date   WBC 6.4 05/07/2020   HGB 13.3 05/07/2020   PLT 240 05/07/2020   NA 142 05/07/2020   K 4.1 05/07/2020   CL 105 05/07/2020   CO2 22 05/07/2020   GLUCOSE 77 05/07/2020   BUN 7 05/07/2020   CREATININE 1.07 (H) 05/07/2020   BILITOT 0.2 05/07/2020   ALKPHOS 51 05/07/2020   AST 22 05/07/2020   ALT 17 05/07/2020   PROT 7.1 05/07/2020   ALBUMIN 4.1 05/07/2020   CALCIUM 9.2 05/07/2020   GFRAA 75 05/07/2020    Speciality Comments: No specialty comments available.  Procedures:  No procedures performed Allergies: Simvastatin and Tetanus-diphth-acell pertussis   Assessment / Plan:     Visit Diagnoses: Primary osteoarthritis of both hands - Clinical and radiographic findings are consistent with osteoarthritis.  All autoimmune  labs were negative.  Joint protection muscle strengthening was discussed.  Paresthesia of both hands-ultrasound examination of bilateral hands did not show any synovitis.  Bilateral median nerves within normal limits.  She continues to have some paresthesias.  She uses carpal tunnel braces at nighttime for short time and then stopped using the braces.  I have encouraged her to use braces at bedtime.  Chondromalacia patellae, left knee - Mild chondromalacia was noted on the x-ray.  Patient is noted improvement on meloxicam.  Weight loss diet and exercise was discussed.  Lower extremity muscle strength exercises were discussed and demonstrated in the office.  I have advised her to come back in case her symptoms get worse.  Otherwise she will be returning on as needed basis.  History  of Graves' disease  History of IBS - she has history of IBS and is followed by gastroenterology.  Hypercholesterolemia  Vitamin D deficiency-vitamin D level on May 07, 2020 was normal.  Body mass index (BMI) of 36.0 to 36.9-today her BMI was 38.14.  Weight loss diet and exercise was emphasized.  Orders: No orders of the defined types were placed in this encounter.  No orders of the defined types were placed in this encounter.    Follow-Up Instructions: Return if symptoms worsen or fail to improve, for Osteoarthritis.   Pollyann Savoy, MD  Note - This record has been created using Animal nutritionist.  Chart creation errors have been sought, but may not always  have been located. Such creation errors do not reflect on  the standard of medical care.

## 2021-04-18 ENCOUNTER — Other Ambulatory Visit (HOSPITAL_COMMUNITY): Payer: Self-pay

## 2021-04-18 MED ORDER — VITAMIN D (ERGOCALCIFEROL) 1.25 MG (50000 UNIT) PO CAPS
ORAL_CAPSULE | ORAL | 0 refills | Status: DC
  Filled 2021-04-18: qty 13, 90d supply, fill #0

## 2021-04-18 MED FILL — Ergocalciferol Cap 1.25 MG (50000 Unit): ORAL | 7 days supply | Qty: 1 | Fill #0 | Status: CN

## 2021-04-19 ENCOUNTER — Other Ambulatory Visit (HOSPITAL_COMMUNITY): Payer: Self-pay

## 2021-04-25 ENCOUNTER — Other Ambulatory Visit: Payer: Self-pay

## 2021-04-25 ENCOUNTER — Encounter: Payer: Self-pay | Admitting: Rheumatology

## 2021-04-25 ENCOUNTER — Ambulatory Visit: Payer: 59 | Admitting: Rheumatology

## 2021-04-25 VITALS — BP 124/88 | HR 85 | Ht 64.0 in | Wt 222.2 lb

## 2021-04-25 DIAGNOSIS — R202 Paresthesia of skin: Secondary | ICD-10-CM | POA: Diagnosis not present

## 2021-04-25 DIAGNOSIS — E559 Vitamin D deficiency, unspecified: Secondary | ICD-10-CM

## 2021-04-25 DIAGNOSIS — Z8639 Personal history of other endocrine, nutritional and metabolic disease: Secondary | ICD-10-CM

## 2021-04-25 DIAGNOSIS — E7800 Pure hypercholesterolemia, unspecified: Secondary | ICD-10-CM

## 2021-04-25 DIAGNOSIS — Z8719 Personal history of other diseases of the digestive system: Secondary | ICD-10-CM

## 2021-04-25 DIAGNOSIS — M19042 Primary osteoarthritis, left hand: Secondary | ICD-10-CM

## 2021-04-25 DIAGNOSIS — Z6836 Body mass index (BMI) 36.0-36.9, adult: Secondary | ICD-10-CM

## 2021-04-25 DIAGNOSIS — M19041 Primary osteoarthritis, right hand: Secondary | ICD-10-CM

## 2021-04-25 DIAGNOSIS — M2242 Chondromalacia patellae, left knee: Secondary | ICD-10-CM

## 2021-05-08 ENCOUNTER — Other Ambulatory Visit (HOSPITAL_COMMUNITY): Payer: Self-pay

## 2021-05-08 MED FILL — Linaclotide Cap 290 MCG: ORAL | 90 days supply | Qty: 90 | Fill #0 | Status: AC

## 2021-05-16 ENCOUNTER — Other Ambulatory Visit (HOSPITAL_COMMUNITY): Payer: Self-pay

## 2021-05-16 MED FILL — Liraglutide (Weight Mngmt) Soln Pen-Inj 18 MG/3ML (6 MG/ML): SUBCUTANEOUS | 30 days supply | Qty: 15 | Fill #0 | Status: AC

## 2021-05-21 DIAGNOSIS — S8002XA Contusion of left knee, initial encounter: Secondary | ICD-10-CM | POA: Diagnosis not present

## 2021-05-21 DIAGNOSIS — I10 Essential (primary) hypertension: Secondary | ICD-10-CM | POA: Diagnosis not present

## 2021-05-21 DIAGNOSIS — R52 Pain, unspecified: Secondary | ICD-10-CM | POA: Diagnosis not present

## 2021-05-21 DIAGNOSIS — R519 Headache, unspecified: Secondary | ICD-10-CM | POA: Diagnosis not present

## 2021-05-21 DIAGNOSIS — M25562 Pain in left knee: Secondary | ICD-10-CM | POA: Diagnosis not present

## 2021-06-07 ENCOUNTER — Other Ambulatory Visit (HOSPITAL_COMMUNITY): Payer: Self-pay

## 2021-06-07 DIAGNOSIS — E890 Postprocedural hypothyroidism: Secondary | ICD-10-CM | POA: Diagnosis not present

## 2021-06-07 DIAGNOSIS — E6609 Other obesity due to excess calories: Secondary | ICD-10-CM | POA: Diagnosis not present

## 2021-06-07 DIAGNOSIS — G43009 Migraine without aura, not intractable, without status migrainosus: Secondary | ICD-10-CM | POA: Diagnosis not present

## 2021-06-07 MED ORDER — LEVOTHYROXINE SODIUM 125 MCG PO TABS
125.0000 ug | ORAL_TABLET | Freq: Every morning | ORAL | 1 refills | Status: DC
  Filled 2021-06-07: qty 90, 90d supply, fill #0
  Filled 2021-10-09: qty 90, 90d supply, fill #1

## 2021-06-07 MED ORDER — SUMATRIPTAN SUCCINATE 100 MG PO TABS
ORAL_TABLET | ORAL | 1 refills | Status: DC
Start: 2021-06-07 — End: 2022-01-02
  Filled 2021-06-07: qty 9, 30d supply, fill #0

## 2021-06-19 ENCOUNTER — Other Ambulatory Visit (HOSPITAL_COMMUNITY): Payer: Self-pay

## 2021-06-26 DIAGNOSIS — H5213 Myopia, bilateral: Secondary | ICD-10-CM | POA: Diagnosis not present

## 2021-07-05 DIAGNOSIS — Z01419 Encounter for gynecological examination (general) (routine) without abnormal findings: Secondary | ICD-10-CM | POA: Diagnosis not present

## 2021-07-05 DIAGNOSIS — Z6838 Body mass index (BMI) 38.0-38.9, adult: Secondary | ICD-10-CM | POA: Diagnosis not present

## 2021-07-05 DIAGNOSIS — Z1231 Encounter for screening mammogram for malignant neoplasm of breast: Secondary | ICD-10-CM | POA: Diagnosis not present

## 2021-07-10 ENCOUNTER — Other Ambulatory Visit: Payer: Self-pay | Admitting: Obstetrics and Gynecology

## 2021-07-10 DIAGNOSIS — R928 Other abnormal and inconclusive findings on diagnostic imaging of breast: Secondary | ICD-10-CM

## 2021-07-13 ENCOUNTER — Other Ambulatory Visit: Payer: Self-pay | Admitting: Obstetrics and Gynecology

## 2021-07-13 ENCOUNTER — Ambulatory Visit
Admission: RE | Admit: 2021-07-13 | Discharge: 2021-07-13 | Disposition: A | Discharge status: Home / Self Care | Payer: 59 | Source: Ambulatory Visit | Attending: Obstetrics and Gynecology | Admitting: Obstetrics and Gynecology

## 2021-07-13 DIAGNOSIS — R922 Inconclusive mammogram: Secondary | ICD-10-CM | POA: Diagnosis not present

## 2021-07-13 DIAGNOSIS — R928 Other abnormal and inconclusive findings on diagnostic imaging of breast: Secondary | ICD-10-CM

## 2021-07-15 ENCOUNTER — Other Ambulatory Visit (HOSPITAL_COMMUNITY): Payer: Self-pay

## 2021-07-16 ENCOUNTER — Other Ambulatory Visit (HOSPITAL_COMMUNITY): Payer: Self-pay

## 2021-07-16 MED ORDER — VITAMIN D (ERGOCALCIFEROL) 1.25 MG (50000 UNIT) PO CAPS
50000.0000 [IU] | ORAL_CAPSULE | ORAL | 1 refills | Status: DC
  Filled 2021-07-16: qty 13, 90d supply, fill #0
  Filled 2021-10-18: qty 13, 90d supply, fill #1

## 2021-08-06 MED FILL — Linaclotide Cap 290 MCG: ORAL | 90 days supply | Qty: 90 | Fill #1 | Status: AC

## 2021-08-07 ENCOUNTER — Other Ambulatory Visit (HOSPITAL_COMMUNITY): Payer: Self-pay

## 2021-10-09 ENCOUNTER — Other Ambulatory Visit (HOSPITAL_COMMUNITY): Payer: Self-pay

## 2021-10-18 ENCOUNTER — Other Ambulatory Visit (HOSPITAL_COMMUNITY): Payer: Self-pay

## 2021-10-18 IMAGING — CR DG CHEST 2V
2 series · 2 of 2 positions shown · non-contrast
Comparison: None.

CLINICAL DATA: Shortness of breath and cough

EXAM:
CHEST - 2 VIEW

[w chest pa]
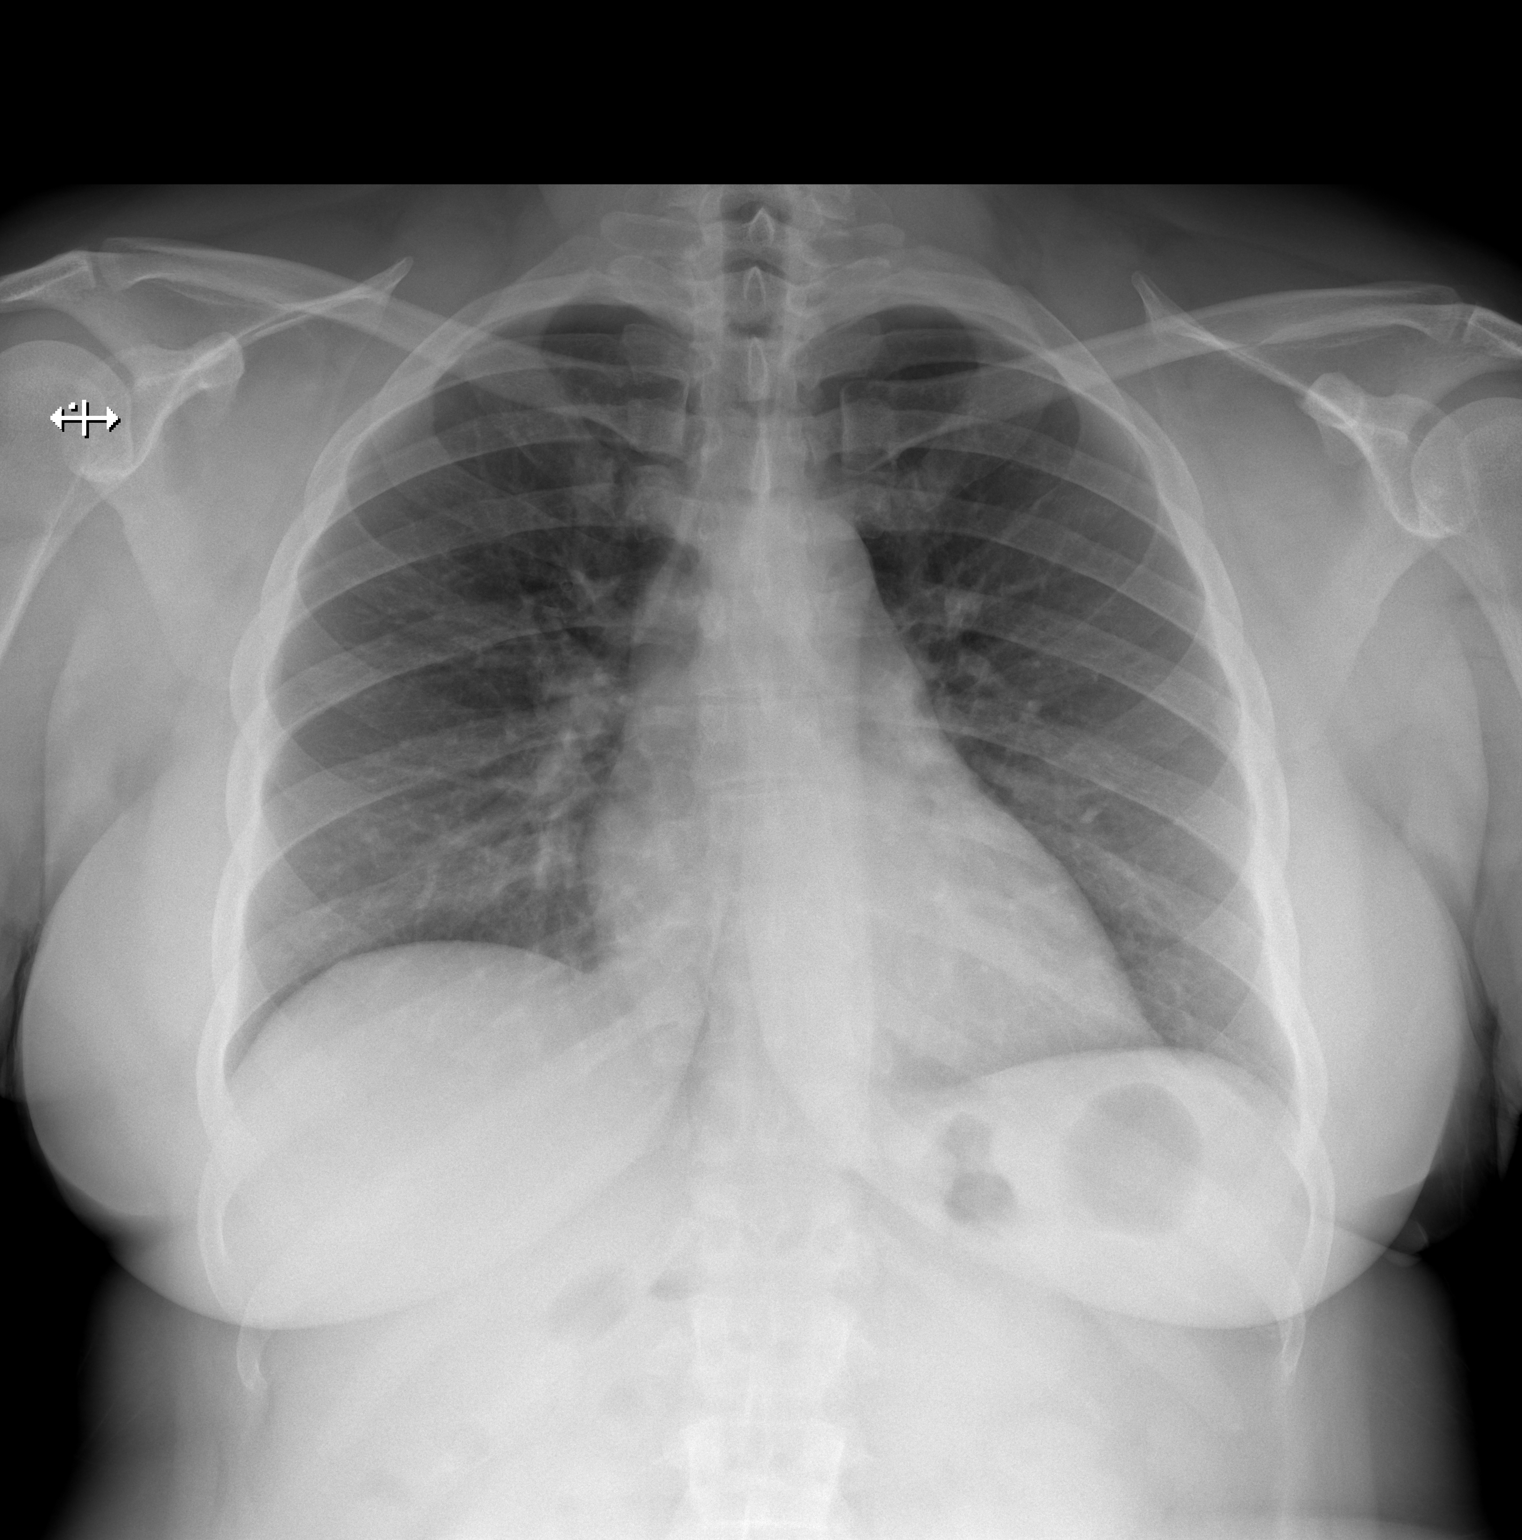

[w chest lat]
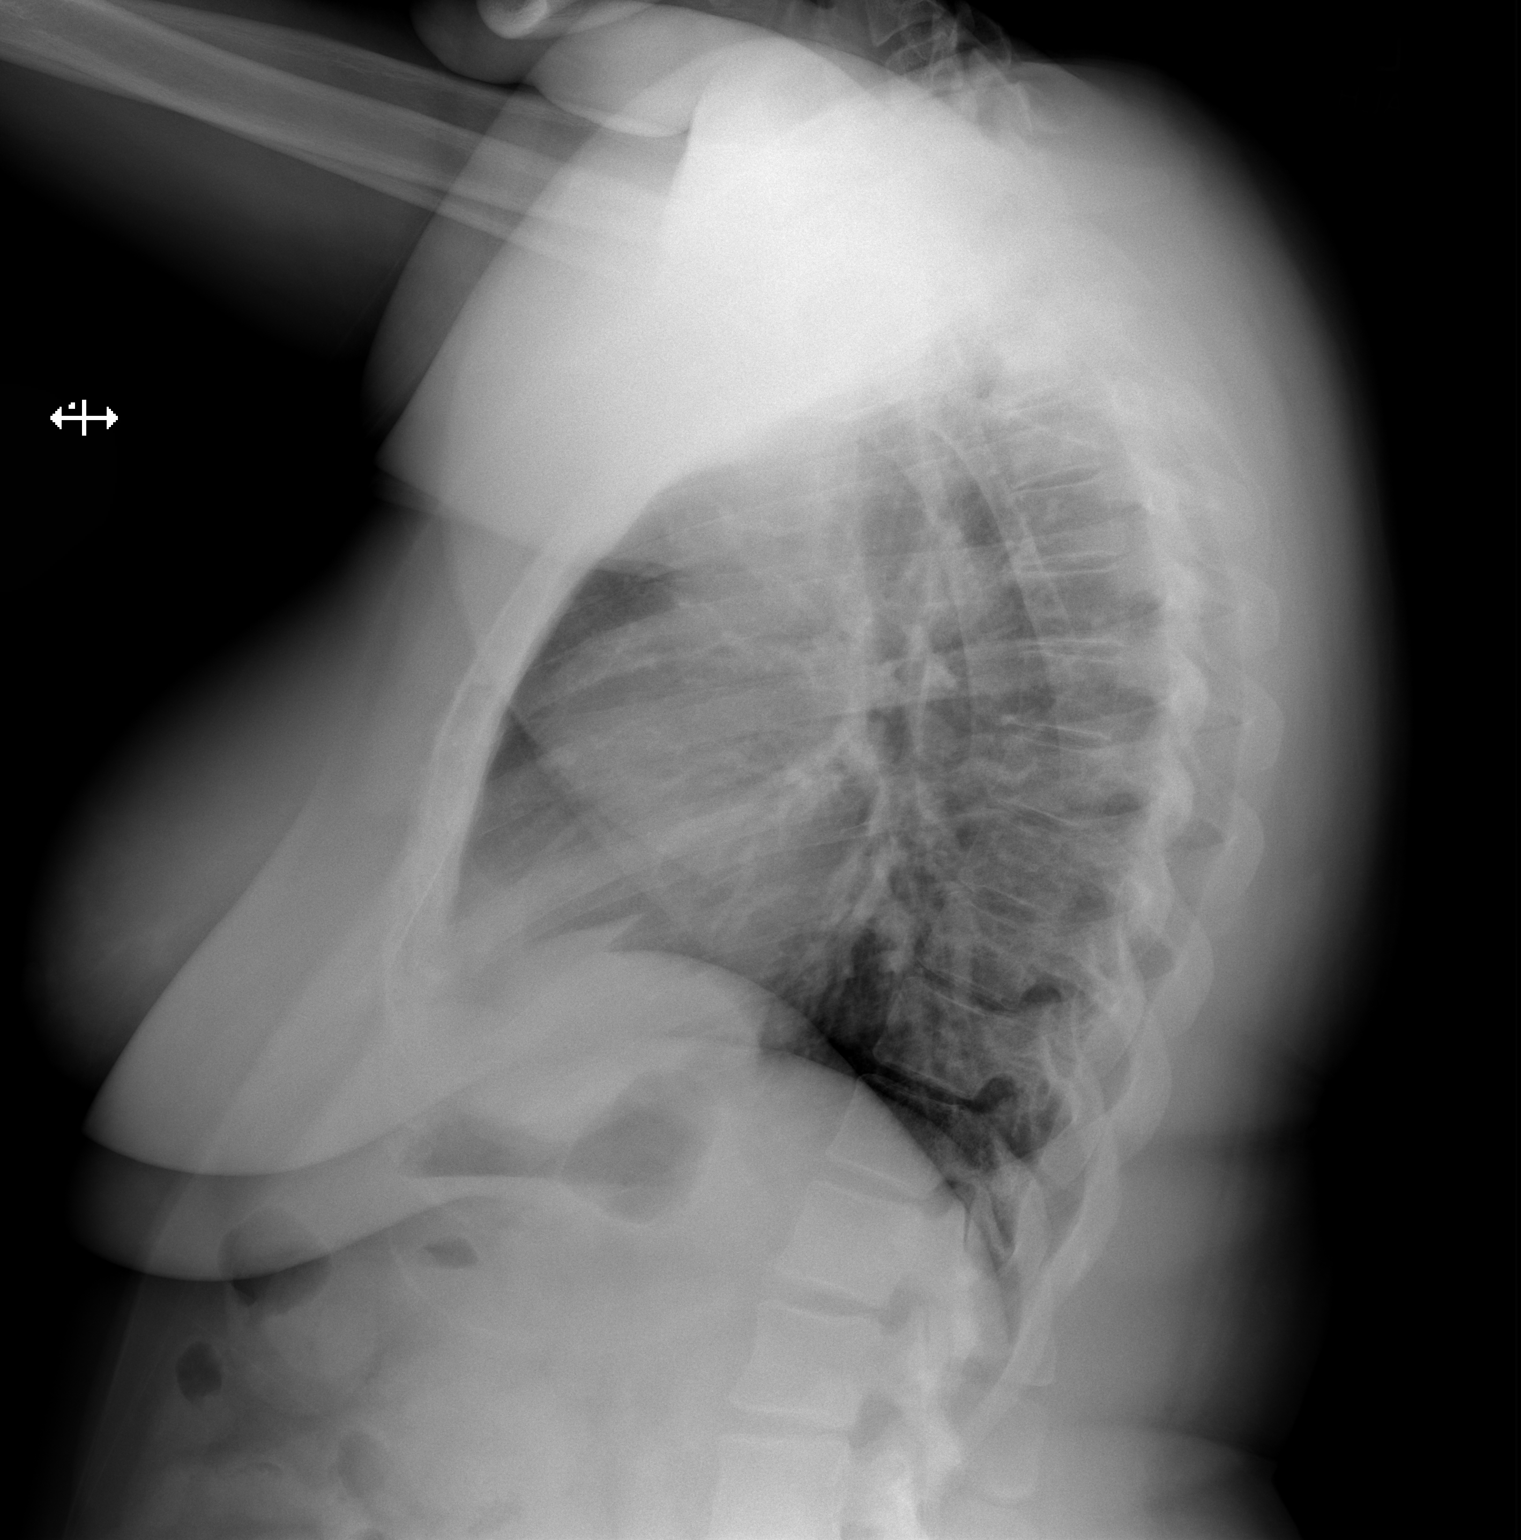

[2 of 2 positions shown; findings below may reference images not displayed]

FINDINGS: The heart size and mediastinal contours are within normal limits.
Both lungs are clear. The visualized skeletal structures are
unremarkable.
IMPRESSION: No active cardiopulmonary disease.

## 2021-11-10 ENCOUNTER — Other Ambulatory Visit (HOSPITAL_COMMUNITY): Payer: Self-pay

## 2021-11-10 MED FILL — Linaclotide Cap 290 MCG: ORAL | 90 days supply | Qty: 90 | Fill #2 | Status: AC

## 2021-11-11 ENCOUNTER — Other Ambulatory Visit (HOSPITAL_COMMUNITY): Payer: Self-pay

## 2021-11-12 ENCOUNTER — Other Ambulatory Visit (HOSPITAL_COMMUNITY): Payer: Self-pay

## 2021-12-09 ENCOUNTER — Other Ambulatory Visit (HOSPITAL_COMMUNITY): Payer: Self-pay

## 2021-12-09 DIAGNOSIS — E890 Postprocedural hypothyroidism: Secondary | ICD-10-CM | POA: Diagnosis not present

## 2021-12-09 DIAGNOSIS — R7303 Prediabetes: Secondary | ICD-10-CM | POA: Diagnosis not present

## 2021-12-09 DIAGNOSIS — E7800 Pure hypercholesterolemia, unspecified: Secondary | ICD-10-CM | POA: Diagnosis not present

## 2021-12-09 DIAGNOSIS — E559 Vitamin D deficiency, unspecified: Secondary | ICD-10-CM | POA: Diagnosis not present

## 2021-12-09 MED ORDER — LEVOTHYROXINE SODIUM 125 MCG PO TABS
ORAL_TABLET | ORAL | 1 refills | Status: DC
  Filled 2021-12-09: qty 90, 90d supply, fill #0
  Filled 2022-04-12: qty 90, 90d supply, fill #1

## 2021-12-20 ENCOUNTER — Other Ambulatory Visit (HOSPITAL_COMMUNITY): Payer: Self-pay

## 2021-12-25 ENCOUNTER — Other Ambulatory Visit (HOSPITAL_COMMUNITY): Payer: Self-pay

## 2022-01-02 ENCOUNTER — Ambulatory Visit: Payer: 59 | Admitting: Podiatry

## 2022-01-02 ENCOUNTER — Encounter: Payer: Self-pay | Admitting: Podiatry

## 2022-01-02 ENCOUNTER — Other Ambulatory Visit: Payer: Self-pay

## 2022-01-02 DIAGNOSIS — L603 Nail dystrophy: Secondary | ICD-10-CM

## 2022-01-02 DIAGNOSIS — B351 Tinea unguium: Secondary | ICD-10-CM | POA: Diagnosis not present

## 2022-01-02 DIAGNOSIS — L601 Onycholysis: Secondary | ICD-10-CM | POA: Diagnosis not present

## 2022-01-02 NOTE — Progress Notes (Signed)
°  Subjective:  Patient ID: Susan Shields, female    DOB: 15-Jun-1981,  MRN: [id number] HPI Chief Complaint  Patient presents with   Nail Problem    Toenails bilateral - thick and dark x years, injury to left hallux before   New Patient (Initial Visit)    41 y.o. female presents with the above complaint.   ROS: Denies fever chills nausea vomiting muscle aches pains calf pain back pain chest pain shortness of breath.  Past Medical History:  Diagnosis Date   Constipation    Graves disease    Hyperlipidemia    IBS (irritable bowel syndrome)    Thyroid disease    Vitamin D deficiency    Past Surgical History:  Procedure Laterality Date   CESAREAN SECTION     COLONOSCOPY      Current Outpatient Medications:    levothyroxine (SYNTHROID) 125 MCG tablet, Take 1 tablet by mouth once daily in the morning on an empty stomach, Disp: 90 tablet, Rfl: 1   linaclotide (LINZESS) 290 MCG CAPS capsule, TAKE 1 CAPSULE BY MOUTH ONCE DAILY BEFORE BREAKFAST, Disp: 90 capsule, Rfl: 3   Vitamin D, Ergocalciferol, (DRISDOL) 1.25 MG (50000 UNIT) CAPS capsule, Take 1 capsule by mouth once a week., Disp: 13 capsule, Rfl: 1  Allergies  Allergen Reactions   Simvastatin Other (See Comments)   Tetanus-Diphth-Acell Pertussis Other (See Comments)   Review of Systems Objective:  There were no vitals filed for this visit.  General: Well developed, nourished, in no acute distress, alert and oriented x3   Dermatological: Skin is warm, dry and supple bilateral. Nails x 10 are well maintained; remaining integument appears unremarkable at this time. There are no open sores, no preulcerative lesions, no rash or signs of infection present.  Vascular: Dorsalis Pedis artery and Posterior Tibial artery pedal pulses are 2/4 bilateral with immedate capillary fill time. Pedal hair growth present. No varicosities and no lower extremity edema present bilateral.   Neruologic: Grossly intact via light touch bilateral.  Vibratory intact via tuning fork bilateral. Protective threshold with Semmes Wienstein monofilament intact to all pedal sites bilateral. Patellar and Achilles deep tendon reflexes 2+ bilateral. No Babinski or clonus noted bilateral.   Musculoskeletal: No gross boney pedal deformities bilateral. No pain, crepitus, or limitation noted with foot and ankle range of motion bilateral. Muscular strength 5/5 in all groups tested bilateral.  Gait: Unassisted, Nonantalgic.    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  Plan: Samples of the skin and nails were taken today for pathologic evaluation encouraged her to obtain biotin and start taking that.  I will follow-up with her in 1 month for her results.     Kairi Harshbarger T. [place], [place]

## 2022-01-13 ENCOUNTER — Encounter: Payer: Self-pay | Admitting: Podiatry

## 2022-01-14 ENCOUNTER — Ambulatory Visit
Admission: RE | Admit: 2022-01-14 | Discharge: 2022-01-14 | Disposition: A | Discharge status: Home / Self Care | Payer: 59 | Source: Ambulatory Visit | Attending: Obstetrics and Gynecology | Admitting: Obstetrics and Gynecology

## 2022-01-14 ENCOUNTER — Other Ambulatory Visit: Payer: Self-pay | Admitting: Obstetrics and Gynecology

## 2022-01-14 DIAGNOSIS — R928 Other abnormal and inconclusive findings on diagnostic imaging of breast: Secondary | ICD-10-CM

## 2022-01-14 DIAGNOSIS — N6312 Unspecified lump in the right breast, upper inner quadrant: Secondary | ICD-10-CM | POA: Diagnosis not present

## 2022-01-15 ENCOUNTER — Other Ambulatory Visit: Payer: Self-pay | Admitting: Obstetrics and Gynecology

## 2022-01-15 DIAGNOSIS — R928 Other abnormal and inconclusive findings on diagnostic imaging of breast: Secondary | ICD-10-CM

## 2022-01-28 ENCOUNTER — Other Ambulatory Visit (HOSPITAL_COMMUNITY): Payer: Self-pay

## 2022-01-28 DIAGNOSIS — R1031 Right lower quadrant pain: Secondary | ICD-10-CM | POA: Diagnosis not present

## 2022-01-28 DIAGNOSIS — J209 Acute bronchitis, unspecified: Secondary | ICD-10-CM | POA: Diagnosis not present

## 2022-01-28 MED ORDER — AZITHROMYCIN 250 MG PO TABS
ORAL_TABLET | ORAL | 0 refills | Status: DC
  Filled 2022-01-28: qty 6, 5d supply, fill #0

## 2022-02-06 ENCOUNTER — Ambulatory Visit: Payer: 59 | Admitting: Podiatry

## 2022-02-07 ENCOUNTER — Other Ambulatory Visit: Payer: Self-pay | Admitting: Gastroenterology

## 2022-02-07 ENCOUNTER — Other Ambulatory Visit (HOSPITAL_COMMUNITY): Payer: Self-pay

## 2022-02-07 MED ORDER — VITAMIN D (ERGOCALCIFEROL) 1.25 MG (50000 UNIT) PO CAPS
ORAL_CAPSULE | ORAL | 1 refills | Status: DC
  Filled 2022-02-07: qty 12, 84d supply, fill #0
  Filled 2022-04-22: qty 12, 84d supply, fill #1
  Filled 2022-07-07: qty 12, 84d supply, fill #2

## 2022-02-10 ENCOUNTER — Other Ambulatory Visit: Payer: Self-pay | Admitting: Gastroenterology

## 2022-02-10 ENCOUNTER — Other Ambulatory Visit (HOSPITAL_COMMUNITY): Payer: Self-pay

## 2022-02-10 MED ORDER — LINACLOTIDE 290 MCG PO CAPS
ORAL_CAPSULE | ORAL | 0 refills | Status: DC
  Filled 2022-02-10: qty 90, 90d supply, fill #0

## 2022-02-11 ENCOUNTER — Other Ambulatory Visit (HOSPITAL_COMMUNITY): Payer: Self-pay

## 2022-02-11 MED ORDER — BENZONATATE 200 MG PO CAPS
ORAL_CAPSULE | ORAL | 0 refills | Status: DC
  Filled 2022-02-11: qty 30, 10d supply, fill #0

## 2022-02-18 ENCOUNTER — Telehealth (INDEPENDENT_AMBULATORY_CARE_PROVIDER_SITE_OTHER): Payer: 59 | Admitting: Gastroenterology

## 2022-02-18 ENCOUNTER — Telehealth: Payer: Self-pay | Admitting: Gastroenterology

## 2022-02-18 ENCOUNTER — Encounter: Payer: Self-pay | Admitting: Gastroenterology

## 2022-02-18 DIAGNOSIS — K5904 Chronic idiopathic constipation: Secondary | ICD-10-CM | POA: Insufficient documentation

## 2022-02-18 NOTE — Patient Instructions (Addendum)
Continue Linzess 290 mcg daily as well as her once daily Dulcolax tablet.  We will send further refills when needed.  She will continue with good diet, increase fluid intake, etc. will contact our office with any other issues otherwise follow-up in about a year.

## 2022-02-18 NOTE — Telephone Encounter (Signed)
Ms. Susan Shields, Susan Shields are scheduled for a virtual visit with your provider today.    Just as we do with appointments in the office, we must obtain your consent to participate.  Your consent will be active for this visit and any virtual visit you may have with one of our providers in the next 365 days.    If you have a MyChart account, I can also send a copy of this consent to you electronically.  All virtual visits are billed to your insurance company just like a traditional visit in the office.  As this is a virtual visit, video technology does not allow for your provider to perform a traditional examination.  This may limit your provider's ability to fully assess your condition.  If your provider identifies any concerns that need to be evaluated in person or the need to arrange testing such as labs, EKG, etc, we will make arrangements to do so.    Although advances in technology are sophisticated, we cannot ensure that it will always work on either your end or our end.  If the connection with a video visit is poor, we may have to switch to a telephone visit.  With either a video or telephone visit, we are not always able to ensure that we have a secure connection.   I need to obtain your verbal consent now.   Are you willing to proceed with your visit today?   Susan Shields has provided verbal consent on 02/18/2022 for a virtual visit (video or telephone).   Laban Emperor. Doyle Kunath, PA-C 02/18/2022  8:36 AM

## 2022-02-18 NOTE — Progress Notes (Signed)
02/18/2022 Susan Shields [id number] 01/19/1981  I connected with Susan Shields on 02/18/22 at 8:30 am by video enabled telemedicine application and verified that I am speaking with the correct person.  The patient and the provider were at separate locations during the entire encounter.  I discussed the limitations of evaluation and management by telemedicine and the availability of in person appointments.  Patient expressed understanding and agreed to proceed.  The patient and provider were at different locations for the entirety of the visit.  The patient was at work/office and the provider was at [facility] gastroenterology office.   HISTORY OF PRESENT ILLNESS: This is a pleasant 41 year old female who is a patient of Dr. Ardell Isaacs.  She follows here for CIC/IBS-C.  She is on Linzess 290 mcg daily and is requesting refills of that.  She also takes one Dulcolax tablet every day to manage her symptoms.  She has been on that same regimen for quite some time and continues to do well with that.  She denies any rectal bleeding.  She tells me that she just had labs performed by her PCP as they monitor her thyroid, etc. and things looked well except cholesterol slightly elevated and prediabetic so she is going to work on her diet, exercise, etc. over the next 6 months until she is seen again.  She denies any other significant GI complaints.  Colonoscopy July 2014 with only small internal hemorrhoids.   Past Medical History:  Diagnosis Date   Constipation    Graves disease    Hyperlipidemia    IBS (irritable bowel syndrome)    Thyroid disease    Vitamin D deficiency    Past Surgical History:  Procedure Laterality Date   CESAREAN SECTION     COLONOSCOPY      reports that she has never smoked. She has never used smokeless tobacco. She reports current alcohol use. She reports that she does not use drugs. family history includes Cancer in her mother; Diabetes in her mother; Healthy in her brother,  brother, brother, sister, sister, and son; Obesity in her mother; Stroke in her father; Sudden death in her father; Throat cancer in her father. Allergies  Allergen Reactions   Simvastatin Other (See Comments)   Tetanus-Diphth-Acell Pertussis Other (See Comments)      Outpatient Encounter Medications as of 02/18/2022  Medication Sig   benzonatate (TESSALON) 200 MG capsule Take 1 capsule by mouth as needed three times a day for cough   levothyroxine (SYNTHROID) 125 MCG tablet Take 1 tablet by mouth once daily in the morning on an empty stomach   linaclotide (LINZESS) 290 MCG CAPS capsule TAKE 1 CAPSULE BY MOUTH ONCE DAILY BEFORE BREAKFAST   Vitamin D, Ergocalciferol, (DRISDOL) 1.25 MG (50000 UNIT) CAPS capsule Take 1 capsule by mouth once a week.   azithromycin (ZITHROMAX Z-PAK) 250 MG tablet Take 2 tablets by mouth on the first day, then 1 tablet daily for 4 days   No facility-administered encounter medications on file as of 02/18/2022.    REVIEW OF SYSTEMS  : All other systems reviewed and negative except where noted in the History of Present Illness.   PHYSICAL EXAM: There were no vitals taken for this visit. 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  She has been on the same regimen for quite some time and continues to do well.  Needed an office visit to obtain further refills of her medication.  New prescription for 90 days was just sent last week.  We will authorize any other refills when needed, enough for a year supply when we are contacted for that.  Otherwise she is doing well.  We will contact us for any other issues.  She is due for colonoscopy January 2026 when she turns age 71.   CC:  Deatra James, MD

## 2022-02-20 ENCOUNTER — Other Ambulatory Visit (HOSPITAL_COMMUNITY): Payer: Self-pay

## 2022-02-20 ENCOUNTER — Encounter: Payer: Self-pay | Admitting: Podiatry

## 2022-02-20 ENCOUNTER — Other Ambulatory Visit: Payer: Self-pay

## 2022-02-20 ENCOUNTER — Ambulatory Visit: Payer: 59 | Admitting: Podiatry

## 2022-02-20 DIAGNOSIS — L603 Nail dystrophy: Secondary | ICD-10-CM

## 2022-02-20 DIAGNOSIS — Z79899 Other long term (current) drug therapy: Secondary | ICD-10-CM | POA: Diagnosis not present

## 2022-02-20 MED ORDER — CIPROFLOXACIN HCL 500 MG PO TABS: 500.0000 mg | ORAL_TABLET | Freq: Two times a day (BID) | ORAL | 0 refills | Status: DC

## 2022-02-20 MED ORDER — FLUCONAZOLE 150 MG PO TABS
300.0000 mg | ORAL_TABLET | ORAL | 0 refills | Status: DC
  Filled 2022-02-20 (×2): qty 24, 84d supply, fill #0

## 2022-02-21 ENCOUNTER — Other Ambulatory Visit (HOSPITAL_COMMUNITY): Payer: Self-pay

## 2022-02-21 ENCOUNTER — Encounter: Payer: Self-pay | Admitting: Podiatry

## 2022-02-21 ENCOUNTER — Other Ambulatory Visit: Payer: Self-pay | Admitting: Podiatry

## 2022-02-21 MED ORDER — CIPROFLOXACIN HCL 500 MG PO TABS
500.0000 mg | ORAL_TABLET | Freq: Two times a day (BID) | ORAL | 0 refills | Status: DC
  Filled 2022-02-21: qty 28, 14d supply, fill #0

## 2022-02-21 MED ORDER — FLUCONAZOLE 150 MG PO TABS
300.0000 mg | ORAL_TABLET | ORAL | 0 refills | Status: DC
  Filled 2022-02-21 – 2022-07-08 (×2): qty 24, 84d supply, fill #0

## 2022-02-22 NOTE — Progress Notes (Signed)
She presents today for follow-up of her nail pathology.  Objective: Nail pathology from the lab does demonstrate a yeast as well as a pseudomonal bacteria.  Assessment onychomycosis.  Plan: We are going to put her on Cipro 500 mg 1 twice daily for the next 2 weeks and then start Diflucan after that she will take 300 mg of Diflucan twice a day once weekly and I will follow-up with her in about a month for liver test.

## 2022-03-05 ENCOUNTER — Other Ambulatory Visit (HOSPITAL_COMMUNITY): Payer: Self-pay

## 2022-03-05 DIAGNOSIS — R053 Chronic cough: Secondary | ICD-10-CM | POA: Diagnosis not present

## 2022-03-05 MED ORDER — OMEPRAZOLE 20 MG PO CPDR
DELAYED_RELEASE_CAPSULE | ORAL | 2 refills | Status: DC
  Filled 2022-03-05: qty 30, 30d supply, fill #0

## 2022-03-06 ENCOUNTER — Other Ambulatory Visit: Payer: Self-pay | Admitting: Physician Assistant

## 2022-03-06 ENCOUNTER — Ambulatory Visit
Admission: RE | Admit: 2022-03-06 | Discharge: 2022-03-06 | Disposition: A | Discharge status: Home / Self Care | Payer: 59 | Source: Ambulatory Visit | Attending: Physician Assistant | Admitting: Physician Assistant

## 2022-03-06 DIAGNOSIS — R053 Chronic cough: Secondary | ICD-10-CM

## 2022-03-14 ENCOUNTER — Encounter: Payer: Self-pay | Admitting: Podiatry

## 2022-03-27 ENCOUNTER — Ambulatory Visit: Payer: 59 | Admitting: Pulmonary Disease

## 2022-03-27 ENCOUNTER — Encounter: Payer: Self-pay | Admitting: Pulmonary Disease

## 2022-03-27 ENCOUNTER — Other Ambulatory Visit (HOSPITAL_COMMUNITY): Payer: Self-pay

## 2022-03-27 VITALS — BP 126/74 | HR 102 | Temp 98.6°F | Ht 64.0 in | Wt 235.0 lb

## 2022-03-27 DIAGNOSIS — J4520 Mild intermittent asthma, uncomplicated: Secondary | ICD-10-CM | POA: Diagnosis not present

## 2022-03-27 DIAGNOSIS — R058 Other specified cough: Secondary | ICD-10-CM | POA: Diagnosis not present

## 2022-03-27 MED ORDER — ALBUTEROL SULFATE HFA 108 (90 BASE) MCG/ACT IN AERS
2.0000 | INHALATION_SPRAY | Freq: Four times a day (QID) | RESPIRATORY_TRACT | 6 refills | Status: DC | PRN
  Filled 2022-03-27: qty 8.5, 25d supply, fill #0

## 2022-03-27 MED ORDER — PREDNISONE 10 MG PO TABS
ORAL_TABLET | ORAL | 0 refills | Status: DC
  Filled 2022-03-27: qty 40, 16d supply, fill #0

## 2022-03-27 NOTE — Progress Notes (Signed)
? ?Synopsis: Referred in March 2023 for cough by Deatra JamesSun, Vyvyan, MD ? ?Subjective:  ? ?PATIENT ID: Susan Shields: female DOB: 05/14/1981, MRN: [id number] ? ?Chief Complaint  ?Patient presents with  ? Consult  ?  Pt has had problems with a cough for 3 months which she states happens all throughout the day. Denies any complaints of wheezing. States she does have occasional shortness of breath with exertion.  ? ? ?PMH of iBS, graves. Recent URI with persistent cough following URI symptoms. This all started in Jan 2023. She was covid neg. She however did have covid in 2021.  Since her URI she has had a persistent dry nonproductive cough.  Denies hemoptysis.  She was treated with antibiotics.  Cough never improved over the past couple of weeks she feels like it is getting worse.  It is dry nonproductive and sporadic throughout the day.  Denies any other respiratory symptoms does not feel short of breath.  She has been back to work however the cough has become much more of a nuisance.  She also treated with a PPI to see if this helped and had not had any relief.  Primary care referred to pulmonary for evaluation with ongoing persistent nonproductive cough ? ? ?Past Medical History:  ?Diagnosis Date  ? Constipation   ? Graves disease   ? Hyperlipidemia   ? IBS (irritable bowel syndrome)   ? Thyroid disease   ? Vitamin D deficiency   ?  ? ?Family History  ?Problem Relation Age of Onset  ? Diabetes Mother   ? Cancer Mother   ?     MMT Cancer  ? Obesity Mother   ? Throat cancer Father   ? Sudden death Father   ? Stroke Father   ? Healthy Sister   ? Healthy Brother   ? Healthy Sister   ? Healthy Brother   ? Healthy Brother   ? Healthy Son   ?  ? ?Past Surgical History:  ?Procedure Laterality Date  ? CESAREAN SECTION    ? COLONOSCOPY    ? ? ?Social History  ? ?Socioeconomic History  ? Marital status: Married  ?  Spouse name: Aurther Lofterry  ? Number of children: 1  ? Years of education: Not on file  ? Highest education level: Not on  file  ?Occupational History  ? Occupation: admin assist  ?  Employer: [employer]  ?Tobacco Use  ? Smoking status: Never  ? Smokeless tobacco: Never  ?Vaping Use  ? Vaping Use: Never used  ?Substance and Sexual Activity  ? Alcohol use: Yes  ?  Comment: occasional  ? Drug use: No  ? Sexual activity: Not on file  ?Other Topics Concern  ? Not on file  ?Social History Narrative  ? Not on file  ? ?Social Determinants of Health  ? ?Financial Resource Strain: Not on file  ?Food Insecurity: Not on file  ?Transportation Needs: Not on file  ?Physical Activity: Not on file  ?Stress: Not on file  ?Social Connections: Not on file  ?Intimate Partner Violence: Not on file  ?  ? ?Allergies  ?Allergen Reactions  ? Simvastatin Other (See Comments)  ? Tetanus-Diphth-Acell Pertussis Other (See Comments)  ?  ? ?Outpatient Medications Prior to Visit  ?Medication Sig Dispense Refill  ? fluconazole (DIFLUCAN) 150 MG tablet Take 2 tablets by mouth once a week. 24 tablet 0  ? levothyroxine (SYNTHROID) 125 MCG tablet Take 1 tablet by mouth once daily in the morning  on an empty stomach 90 tablet 1  ? linaclotide (LINZESS) 290 MCG CAPS capsule TAKE 1 CAPSULE BY MOUTH ONCE DAILY BEFORE BREAKFAST 90 capsule 0  ? omeprazole (PRILOSEC) 20 MG capsule Take 1 capsule by mouth 30 minutes before morning meal 30 capsule 2  ? Vitamin D, Ergocalciferol, (DRISDOL) 1.25 MG (50000 UNIT) CAPS capsule Take 1 capsule by mouth once a week. 13 capsule 1  ? benzonatate (TESSALON) 200 MG capsule Take 1 capsule by mouth as needed three times a day for cough 30 capsule 0  ? ciprofloxacin (CIPRO) 500 MG tablet Take 1 tablet by mouth 2 times daily. 28 tablet 0  ? ?No facility-administered medications prior to visit.  ? ? ?Review of Systems  ?Constitutional:  Negative for chills, fever, malaise/fatigue and weight loss.  ?HENT:  Negative for hearing loss, sore throat and tinnitus.   ?Eyes:  Negative for blurred vision and double vision.  ?Respiratory:  Positive for cough.  Negative for hemoptysis, sputum production, shortness of breath, wheezing and stridor.   ?Cardiovascular:  Negative for chest pain, palpitations, orthopnea, leg swelling and PND.  ?Gastrointestinal:  Negative for abdominal pain, constipation, diarrhea, heartburn, nausea and vomiting.  ?Genitourinary:  Negative for dysuria, hematuria and urgency.  ?Musculoskeletal:  Negative for joint pain and myalgias.  ?Skin:  Negative for itching and rash.  ?Neurological:  Negative for dizziness, tingling, weakness and headaches.  ?Endo/Heme/Allergies:  Negative for environmental allergies. Does not bruise/bleed easily.  ?Psychiatric/Behavioral:  Negative for depression. The patient is not nervous/anxious and does not have insomnia.   ?All other systems reviewed and are negative. ? ? ?Objective:  ?Physical Exam ?Vitals reviewed.  ?Constitutional:   ?   General: She is not in acute distress. ?   Appearance: She is well-developed. She is obese.  ?HENT:  ?   Head: Normocephalic and atraumatic.  ?Eyes:  ?   General: No scleral icterus. ?   Conjunctiva/sclera: Conjunctivae normal.  ?   Pupils: Pupils are equal, round, and reactive to light.  ?Neck:  ?   Vascular: No JVD.  ?   Trachea: No tracheal deviation.  ?Cardiovascular:  ?   Rate and Rhythm: Normal rate and regular rhythm.  ?   Heart sounds: Normal heart sounds. No murmur heard. ?Pulmonary:  ?   Effort: Pulmonary effort is normal. No tachypnea, accessory muscle usage or respiratory distress.  ?   Breath sounds: No stridor. No wheezing, rhonchi or rales.  ?Abdominal:  ?   General: There is no distension.  ?   Palpations: Abdomen is soft.  ?   Tenderness: There is no abdominal tenderness.  ?Musculoskeletal:     ?   General: No tenderness.  ?   Cervical back: Neck supple.  ?Lymphadenopathy:  ?   Cervical: No cervical adenopathy.  ?Skin: ?   General: Skin is warm and dry.  ?   Capillary Refill: Capillary refill takes less than 2 seconds.  ?   Findings: No rash.  ?Neurological:  ?    Mental Status: She is alert and oriented to person, place, and time.  ?Psychiatric:     ?   Behavior: Behavior normal.  ? ? ? ?Vitals:  ? 03/27/22 1621  ?BP: 126/74  ?Pulse: (!) 102  ?Temp: 98.6 ?F (37 ?C)  ?TempSrc: Oral  ?SpO2: 99%  ?Weight: 235 lb (106.6 kg)  ?Height: 5\' 4"  (1.626 m)  ? ?99% on RA ?BMI Readings from Last 3 Encounters:  ?03/27/22 40.34 kg/m?  ?04/25/21 38.14 kg/m?  ?  01/24/21 38.28 kg/m?  ? ?Wt Readings from Last 3 Encounters:  ?03/27/22 235 lb (106.6 kg)  ?04/25/21 222 lb 3.2 oz (100.8 kg)  ?01/24/21 223 lb (101.2 kg)  ? ? ? ?CBC ?   ?Component Value Date/Time  ? WBC 6.4 05/07/2020 1056  ? RBC 4.92 05/07/2020 1056  ? HGB 13.3 05/07/2020 1056  ? HCT 42.2 05/07/2020 1056  ? PLT 240 05/07/2020 1056  ? MCV 86 05/07/2020 1056  ? MCH 27.0 05/07/2020 1056  ? MCHC 31.5 05/07/2020 1056  ? RDW 15.7 (H) 05/07/2020 1056  ? LYMPHSABS 2.2 05/07/2020 1056  ? EOSABS 0.2 05/07/2020 1056  ? BASOSABS 0.0 05/07/2020 1056  ? ? ? ?Chest Imaging: ?Chest x-ray: 03/06/2022: ?No active pulmonary disease no infiltrate. ?The patient's images have been independently reviewed by me.   ? ?Pulmonary Functions Testing Results: ?   ? View : No data to display.  ?  ?  ?  ? ? ?FeNO:  ? ?Pathology:  ? ?Echocardiogram:  ? ?Heart Catheterization:  ?   ?Assessment & Plan:  ? ?  ICD-10-CM   ?1. Post-viral cough syndrome  R05.8   ?  ?2. Mild intermittent reactive airway disease without complication  J45.20   ?  ? ? ?Discussion: ?41 year old female recent URI in January.  Now approximately 8 weeks out with nonproductive dry cough.  I suspect she has postviral cough syndrome and mild reactive airway disease related to this. ? ?Plan: ?Start prednisone taper. ?Albuterol inhaler as needed. ?Would like to try this first before we continue other further investigation.  If she improves with the prednisone then we will try to hold off giving her an inhaled steroid. ?And continue to observe.  I did explain that there is a risk for her developing more  of recurrent asthma symptoms but I think we can continue to monitor. ?She is going to call us and let us know if her cough does not go away in the next couple weeks. ?Next step I think would be obtaini

## 2022-03-27 NOTE — Patient Instructions (Signed)
Thank you for visiting Dr. Valeta Harms at [facility]. ?Today we recommend the following: ? ?Meds ordered this encounter  ?Medications  ? predniSONE (DELTASONE) 10 MG tablet  ?  Sig: Take 4 tabs by mouth once daily x4 days, then 3 tabs x4 days, 2 tabs x4 days, 1 tab x4 days and stop.  ?  Dispense:  40 tablet  ?  Refill:  0  ? albuterol (VENTOLIN HFA) 108 (90 Base) MCG/ACT inhaler  ?  Sig: Inhale 2 puffs into the lungs every 6 (six) hours as needed for wheezing or shortness of breath.  ?  Dispense:  8 g  ?  Refill:  6  ? ?Return if symptoms worsen or fail to improve. ? ? ? ?Please do your part to reduce the spread of COVID-19.  ?

## 2022-04-03 ENCOUNTER — Ambulatory Visit: Payer: 59 | Admitting: Podiatry

## 2022-04-03 ENCOUNTER — Encounter: Payer: Self-pay | Admitting: Podiatry

## 2022-04-03 DIAGNOSIS — L603 Nail dystrophy: Secondary | ICD-10-CM

## 2022-04-03 DIAGNOSIS — Z79899 Other long term (current) drug therapy: Secondary | ICD-10-CM

## 2022-04-05 NOTE — Progress Notes (Signed)
She presents today for follow-up of her fluconazole.  States that she already sees some clearing on the nails. ? ?Objective: No change. ? ?Assessment: Onychomycosis associated yeast. ? ?Plan: We are requesting blood work for the use of the fluconazole I will follow-up with her in about 3 months. ?

## 2022-04-07 DIAGNOSIS — Z79899 Other long term (current) drug therapy: Secondary | ICD-10-CM | POA: Diagnosis not present

## 2022-04-08 ENCOUNTER — Telehealth: Payer: Self-pay | Admitting: *Deleted

## 2022-04-08 LAB — COMPREHENSIVE METABOLIC PANEL
AG Ratio: 1.3 (calc) (ref 1.0–2.5)
ALT: 20 U/L (ref 6–29)
AST: 13 U/L (ref 10–30)
Albumin: 3.9 g/dL (ref 3.6–5.1)
Alkaline phosphatase (APISO): 55 U/L (ref 31–125)
BUN/Creatinine Ratio: 14 (calc) (ref 6–22)
BUN: 14 mg/dL (ref 7–25)
CO2: 21 mmol/L (ref 20–32)
Calcium: 9.3 mg/dL (ref 8.6–10.2)
Chloride: 107 mmol/L (ref 98–110)
Creat: 1.02 mg/dL — ABNORMAL HIGH (ref 0.50–0.99)
Globulin: 3 g/dL (calc) (ref 1.9–3.7)
Glucose, Bld: 124 mg/dL (ref 65–139)
Potassium: 4.1 mmol/L (ref 3.5–5.3)
Sodium: 138 mmol/L (ref 135–146)
Total Bilirubin: 0.2 mg/dL (ref 0.2–1.2)
Total Protein: 6.9 g/dL (ref 6.1–8.1)

## 2022-04-08 NOTE — Telephone Encounter (Signed)
-----   Message from Elinor Parkinson, [place] sent at 04/08/2022  7:41 AM EDT ----- ?Blood work looks good and may continue medication. ?

## 2022-04-12 ENCOUNTER — Other Ambulatory Visit (HOSPITAL_COMMUNITY): Payer: Self-pay

## 2022-04-16 ENCOUNTER — Institutional Professional Consult (permissible substitution): Payer: 59 | Admitting: Pulmonary Disease

## 2022-04-22 ENCOUNTER — Other Ambulatory Visit (HOSPITAL_COMMUNITY): Payer: Self-pay

## 2022-05-10 ENCOUNTER — Other Ambulatory Visit (HOSPITAL_COMMUNITY): Payer: Self-pay

## 2022-05-10 ENCOUNTER — Other Ambulatory Visit: Payer: Self-pay | Admitting: Gastroenterology

## 2022-05-12 ENCOUNTER — Other Ambulatory Visit (HOSPITAL_COMMUNITY): Payer: Self-pay

## 2022-05-12 MED ORDER — LINACLOTIDE 290 MCG PO CAPS
ORAL_CAPSULE | ORAL | 1 refills | Status: DC
  Filled 2022-05-12: qty 90, 90d supply, fill #0
  Filled 2022-08-10: qty 90, 90d supply, fill #1

## 2022-06-10 DIAGNOSIS — E6609 Other obesity due to excess calories: Secondary | ICD-10-CM | POA: Diagnosis not present

## 2022-06-10 DIAGNOSIS — E890 Postprocedural hypothyroidism: Secondary | ICD-10-CM | POA: Diagnosis not present

## 2022-06-10 DIAGNOSIS — Z6839 Body mass index (BMI) 39.0-39.9, adult: Secondary | ICD-10-CM | POA: Diagnosis not present

## 2022-06-10 DIAGNOSIS — G43009 Migraine without aura, not intractable, without status migrainosus: Secondary | ICD-10-CM | POA: Diagnosis not present

## 2022-06-11 ENCOUNTER — Encounter: Payer: Self-pay | Admitting: Neurology

## 2022-06-12 ENCOUNTER — Ambulatory Visit: Payer: 59 | Admitting: Podiatry

## 2022-07-07 ENCOUNTER — Other Ambulatory Visit (HOSPITAL_COMMUNITY): Payer: Self-pay

## 2022-07-08 ENCOUNTER — Other Ambulatory Visit (HOSPITAL_COMMUNITY): Payer: Self-pay

## 2022-07-08 MED ORDER — VITAMIN D (ERGOCALCIFEROL) 1.25 MG (50000 UNIT) PO CAPS
50000.0000 [IU] | ORAL_CAPSULE | ORAL | 1 refills | Status: DC
  Filled 2022-07-08: qty 12, 84d supply, fill #0
  Filled 2022-09-26: qty 12, 84d supply, fill #1
  Filled 2023-01-19: qty 12, 84d supply, fill #2

## 2022-07-08 MED ORDER — LEVOTHYROXINE SODIUM 125 MCG PO TABS
125.0000 ug | ORAL_TABLET | Freq: Every day | ORAL | 1 refills | Status: DC
  Filled 2022-07-08: qty 90, 90d supply, fill #0
  Filled 2022-10-06: qty 90, 90d supply, fill #1

## 2022-07-14 ENCOUNTER — Other Ambulatory Visit: Payer: Self-pay | Admitting: Obstetrics and Gynecology

## 2022-07-14 DIAGNOSIS — R928 Other abnormal and inconclusive findings on diagnostic imaging of breast: Secondary | ICD-10-CM

## 2022-07-15 ENCOUNTER — Other Ambulatory Visit (HOSPITAL_COMMUNITY): Payer: Self-pay

## 2022-07-15 ENCOUNTER — Ambulatory Visit
Admission: RE | Admit: 2022-07-15 | Discharge: 2022-07-15 | Disposition: A | Discharge status: Home / Self Care | Payer: 59 | Source: Ambulatory Visit | Attending: Obstetrics and Gynecology | Admitting: Obstetrics and Gynecology

## 2022-07-15 ENCOUNTER — Encounter: Payer: Self-pay | Admitting: Psychiatry

## 2022-07-15 ENCOUNTER — Ambulatory Visit: Payer: 59 | Admitting: Psychiatry

## 2022-07-15 VITALS — BP 127/88 | HR 76 | Ht 64.0 in | Wt 237.8 lb

## 2022-07-15 DIAGNOSIS — G43101 Migraine with aura, not intractable, with status migrainosus: Secondary | ICD-10-CM | POA: Diagnosis not present

## 2022-07-15 DIAGNOSIS — R519 Headache, unspecified: Secondary | ICD-10-CM | POA: Diagnosis not present

## 2022-07-15 DIAGNOSIS — R928 Other abnormal and inconclusive findings on diagnostic imaging of breast: Secondary | ICD-10-CM

## 2022-07-15 DIAGNOSIS — R29818 Other symptoms and signs involving the nervous system: Secondary | ICD-10-CM

## 2022-07-15 DIAGNOSIS — R922 Inconclusive mammogram: Secondary | ICD-10-CM | POA: Diagnosis not present

## 2022-07-15 DIAGNOSIS — N6489 Other specified disorders of breast: Secondary | ICD-10-CM | POA: Diagnosis not present

## 2022-07-15 MED ORDER — KETOROLAC TROMETHAMINE 10 MG PO TABS
10.0000 mg | ORAL_TABLET | Freq: Four times a day (QID) | ORAL | 0 refills | Status: AC | PRN
  Filled 2022-07-15: qty 15, 4d supply, fill #0

## 2022-07-15 MED ORDER — SUMATRIPTAN SUCCINATE 50 MG PO TABS
50.0000 mg | ORAL_TABLET | ORAL | 6 refills | Status: DC | PRN
  Filled 2022-07-15: qty 10, 30d supply, fill #0
  Filled 2022-08-27: qty 10, 30d supply, fill #1

## 2022-07-15 MED ORDER — KETOROLAC TROMETHAMINE 60 MG/2ML IM SOLN
60.0000 mg | Freq: Once | INTRAMUSCULAR | Status: AC
  Administered 2022-07-15: 60 mg via INTRAMUSCULAR

## 2022-07-15 MED ORDER — TOPIRAMATE 25 MG PO TABS
ORAL_TABLET | ORAL | 3 refills | Status: DC
  Filled 2022-07-15: qty 120, 41d supply, fill #0
  Filled 2022-08-27: qty 120, 41d supply, fill #1

## 2022-07-15 NOTE — Progress Notes (Signed)
Orders given from MD for Ketorolac 60mg    Pt verified by name and DOB   Explain procedure to pt,   ketorolac 60mg  given in the RD   Pt advised to wait 15 min's after injection    Pt tolerated well, no questions.

## 2022-07-15 NOTE — Progress Notes (Signed)
Referring:  Deatra James, MD [phone] [address] [place],  [place] [address]  PCP: Deatra James, MD  Neurology was asked to evaluate Susan Shields, a 41 year old female for a chief complaint of headaches.  Our recommendations of care will be communicated by shared medical record.    CC:  headaches  History provided from self  HPI:  Medical co-morbidities: hypothyroidism  The patient presents for evaluation of headaches which began when she had her son 18 years ago. They had improved until they increased in frequency ~4 months ago. Her son was recently accepted into college which has increased her stress level. Headaches are described as occipital or temporal throbbing with associated photophobia and nausea. She will occasionally have a visual aura of black dots and squiggly lines in her vision. They can last for several days at a time. This past month her headaches have been constant.  Tried rizatriptan as needed which didn't help. She is currently taking OTCs at least twice a day every day. Took prednisone for a cough in March which did not help her headache and gave her side effects. She would prefer to avoid steroids if possible.  Headache History: Onset: 18 years ago, worsened 4 months ago Triggers: stress Aura: black dots, squiggly lines Location: occipital, temporal Quality/Description: throbbing Associated Symptoms:  Photophobia: yes  Phonophobia: no  Nausea: yes Vomiting: no Associated symptoms: vertigo Worse with activity?: yes Duration of headaches: several days  Headache days per month: 30 Headache free days per month: 0  Current Treatment: Abortive Excedrin migraine Tylenol Aleve  Preventative none  Prior Therapies                                 Maxalt Excedrin Aleve Tylenol   LABS: CBC    Component Value Date/Time   WBC 6.4 05/07/2020 1056   RBC 4.92 05/07/2020 1056   HGB 13.3 05/07/2020 1056   HCT 42.2 05/07/2020 1056   PLT 240  05/07/2020 1056   MCV 86 05/07/2020 1056   MCH 27.0 05/07/2020 1056   MCHC 31.5 05/07/2020 1056   RDW 15.7 (H) 05/07/2020 1056   LYMPHSABS 2.2 05/07/2020 1056   EOSABS 0.2 05/07/2020 1056   BASOSABS 0.0 05/07/2020 1056      Latest Ref Rng & Units 04/07/2022    4:59 PM 05/07/2020   10:56 AM 11/17/2019    8:14 AM  CMP  Glucose 65 - 139 mg/dL 024  77  78   BUN 7 - 25 mg/dL 14  7  11    Creatinine 0.50 - 0.99 mg/dL  0.97  3.53   Sodium 135 - 146 mmol/L 138  142  140   Potassium 3.5 - 5.3 mmol/L 4.1  4.1  3.9   Chloride 98 - 110 mmol/L 107  105  105   CO2 20 - 32 mmol/L 21  22  21    Calcium 8.6 - 10.2 mg/dL 9.3  9.2  9.0   Total Protein 6.1 - 8.1 g/dL 6.9  7.1  6.8   Total Bilirubin 0.2 - 1.2 mg/dL 0.2  0.2  0.3   Alkaline Phos 39 - 117 IU/L  51  50   AST 10 - 30 U/L 13  22  21    ALT 6 - 29 U/L 20  17  21       IMAGING:  MRI brain over 10 years ago was reportedly normal per  patient   Current Outpatient Medications on File Prior to Visit  Medication Sig Dispense Refill   fluconazole (DIFLUCAN) 150 MG tablet Take 2 tablets by mouth once a week. 24 tablet 0   levothyroxine (SYNTHROID) 125 MCG tablet Take 1 tablet by mouth once daily in the morning on an empty stomach 90 tablet 1   linaclotide (LINZESS) 290 MCG CAPS capsule TAKE 1 CAPSULE BY MOUTH ONCE DAILY BEFORE BREAKFAST 90 capsule 1   Vitamin D, Ergocalciferol, (DRISDOL) 1.25 MG (50000 UNIT) CAPS capsule Take 1 capsule by mouth once a week. 13 capsule 1   albuterol (VENTOLIN HFA) 108 (90 Base) MCG/ACT inhaler Inhale 2 puffs by mouth into the lungs every 6  hours as needed for wheezing or shortness of breath. 8 g 6   No current facility-administered medications on file prior to visit.     Allergies: Allergies  Allergen Reactions   Simvastatin Other (See Comments)    fatigue   Tetanus-Diphth-Acell Pertussis Other (See Comments)    swelling    Family History: Migraine or other headaches in the family:  sister has  migraines Aneurysms in a first degree relative:  no Brain tumors in the family:  mother passed away from glioblastoma Other neurological illness in the family:   no  Past Medical History: Past Medical History:  Diagnosis Date   Constipation    Graves disease    Hyperlipidemia    IBS (irritable bowel syndrome)    Migraine    Thyroid disease    Vitamin D deficiency     Past Surgical History Past Surgical History:  Procedure Laterality Date   CESAREAN SECTION     COLONOSCOPY      Social History: Social History   Tobacco Use   Smoking status: Never   Smokeless tobacco: Never  Vaping Use   Vaping Use: Never used  Substance Use Topics   Alcohol use: Yes    Comment: occasional   Drug use: No     ROS: Negative for fevers, chills. Positive for headaches. All other systems reviewed and negative unless stated otherwise in HPI.   Physical Exam:   Vital Signs: BP 127/88   Pulse 76   Ht 5\' 4"  (1.626 m)   Wt 237 lb 12.8 oz (107.9 kg)   BMI 40.82 kg/m  GENERAL: well appearing,in no acute distress,alert SKIN:  Color, texture, turgor normal. No rashes or lesions HEAD:  Normocephalic/atraumatic. CV:  RRR RESP: Normal respiratory effort MSK: +tenderness to palpation over left occiput  NEUROLOGICAL: Mental Status: Alert, oriented to person, place and time,Follows commands Cranial Nerves: PERRL, visual fields intact to confrontation, extraocular movements intact, decreased sensation right V1-3, no facial droop or ptosis, hearing grossly intact, no dysarthria Motor: muscle strength 5/5 both upper and lower extremities,no drift, normal tone Reflexes: 2+ throughout Sensation: decreased sensation right upper and lower extremity Coordination: Finger-to- nose-finger intact bilaterally Gait: normal-based   IMPRESSION: 41 year old female with a history of hypothyroidism who presents for evaluation of worsening headaches over the past 4 months. Will order MRI brain as her exam  is significant for decreased sensation over her right hemibody. Headache pattern is most consistent with migraine with aura. She likely has a component of medication overuse headache as well. Toradol shot given in the office today, will provide 5 day course of oral Toradol to help break current headache cycle. Will start Topamax for migraine prevention and Imitrex for rescue. Counseled on limiting rescue medication use to avoid rebound headaches.  PLAN: -MRI brain with contrast -Toradol shot in office today followed by Toradol 10 mg TID x5 days -Prevention: Start Topamax 25 mg QHS, increase to 25 mg weekly up to 100 mg QHS -Rescue: Start Imitrex 50 mg PRN   I spent a total of 28 minutes chart reviewing and counseling the patient. Headache education was done. Discussed treatment options including preventive and acute medications. Discussed medication overuse headache and to limit use of acute treatments to no more than 2 days/week or 10 days/month. Discussed medication side effects, adverse reactions and drug interactions. Written educational materials and patient instructions outlining all of the above were given.  Follow-up: 6 months   Ocie Doyne, MD 07/15/2022   8:51 AM

## 2022-07-15 NOTE — Addendum Note (Signed)
Addended by: Jacqualine Code D on: 07/15/2022 08:57 AM   Modules accepted: Orders

## 2022-07-15 NOTE — Patient Instructions (Addendum)
MRI brain  Start Topamax for headache prevention. Take 25 mg (1 pill) at bedtime for one week, then increase to 50 mg (2 pills) at bedtime for one week, then take 75 mg (3 pills) at bedtime for one week, then take 100 mg (4 pills) at bedtime  Start sumatriptan (Imitrex) as needed for migraine. Take at the onset of migraine. If headache recurs or does not fully resolve, you may take a second dose after 2 hours. Please avoid taking more than 2 days per week  to avoid rebound headaches  Take Toradol 10 mg three times a day for the next 5 days. Do not take NSAIDs (ibuprofen, advil, aleve) while taking this medication  Please limit over the counter medication use to 2 days per week to avoid rebound headaches   GENERAL HEADACHE INSTRUCTIONS Headache Preventive Treatment: Please keep in mind that it takes 4-6 weeks for the medication to start working well and 2-3 months at the appropriate dose before deciding if it will be useful or not. If it is not helping at all by this time, then we will discuss other medications to try. Supplements may take 3-6 months until you see full effect.   Natural supplements: Magnesium Oxide or Magnesium Glycinate 500 mg at bed (up to 800 mg daily) Coenzyme Q10 300 mg in AM Vitamin B2- 200 mg twice a day  Add 1 supplement at a time since even natural supplements can have undesirable side effects. You can sometimes buy supplements cheaper (especially Coenzyme Q10) at www.WebmailGuide.co.za or at [employer].  Vitamins and herbs that show potential:  Magnesium: Magnesium (250 mg twice a day or 500 mg at bed) has a relaxant effect on smooth muscles such as blood vessels. Individuals suffering from frequent or daily headache usually have low magnesium levels which can be increase with daily supplementation of 400-750 mg. Three trials found 40-90% average headache reduction  when used as a preventative. Magnesium also demonstrated the benefit in menstrually related migraine.  Magnesium  is part of the messenger system in the serotonin cascade and it is a good muscle relaxant.  It is also useful for constipation which can be a side effect of other medications used to treat migraine. Good sources include nuts, whole grains, and tomatoes. Side Effects: loose stool/diarrhea Riboflavin (vitamin B 2) 200 mg twice a day. This vitamin assists nerve cells in the production of ATP a principal energy storing molecule.  It is necessary for many chemical reactions in the body.  There have been at least 3 clinical trials of riboflavin using 400 mg per day all of which suggested that migraine frequency can be decreased.  All 3 trials showed significant improvement in over half of migraine sufferers.  The supplement is found in bread, cereal, milk, meat, and poultry.  Most Americans get more riboflavin than the recommended daily allowance, however riboflavin deficiency is not necessary for the supplements to help prevent headache. Side effects: energizing, green urine  Coenzyme Q10: This is present in almost all cells in the body and is critical component for the conversion of energy.  Recent studies have shown that a nutritional supplement of CoQ10 can reduce the frequency of migraine attacks by improving the energy production of cells as with riboflavin.  Doses of 150 mg twice a day have been shown to be effective.  HEADACHE DIET: Foods and beverages which may trigger migraine Note that only 20% of headache patients are food sensitive. You will know if you are food sensitive if  you get a headache consistently 20 minutes to 2 hours after eating a certain food. Only cut out a food if it causes headaches, otherwise you might remove foods you enjoy! What matters most for diet is to eat a well balanced healthy diet full of vegetables and low fat protein, and to not miss meals.  Chocolate, other sweets ALL cheeses except cottage and cream cheese Dairy products, yogurt, sour cream, ice cream Liver Meat  extracts (Bovril, Marmite, meat tenderizers) Meats or fish which have undergone aging, fermenting, pickling or smoking. These include: Hotdogs,salami,Lox,sausage, mortadellas,smoked salmon, pepperoni, Pickled herring Pods of broad bean (English beans, Chinese pea pods, [place] (fava) beans, lima and navy beans Ripe avocado, ripe banana Yeast extracts or active yeast preparations such as Brewer's or Fleishman's (commercial bakes goods are permitted) Tomato based foods, pizza (lasagna, etc.)  MSG (monosodium glutamate) is disguised as many things; look for these common aliases: Monopotassium glutamate Autolysed yeast Hydrolysed protein Sodium caseinate "flavorings" "all natural preservatives" Nutrasweet  Avoid all other foods that convincingly provoke headaches.  Resources: The Dizzy [place] Laundry Your Headache Diet, migrainestrong.com  https://zamora-andrews.com/  Caffeine and Migraine For patients that have migraine, caffeine intake more than 3 days per week can lead to dependency and increased migraine frequency. I would recommend cutting back on your caffeine intake as best you can. The recommended amount of caffeine is 200-300 mg daily, although migraine patients may experience dependency at even lower doses. While you may notice an increase in headache temporarily, cutting back will be helpful for headaches in the long run. For more information on caffeine and migraine, visit: https://americanmigrainefoundation.org/resource-library/caffeine-and-migraine/  Headache Prevention Strategies:  1. Maintain a headache diary; learn to identify and avoid triggers.  - This can be a simple note where you log when you had a headache, associated symptoms, and medications used - There are several smartphone apps developed to help track migraines: Migraine Buddy, Migraine Monitor, Curelator N1-Headache App  Common triggers include: Emotional  triggers: Emotional/Upset family or friends Emotional/Upset occupation Business reversal/success Anticipation anxiety Crisis-serious Post-crisis periodNew job/position   Physical triggers: Vacation Day Weekend Strenuous Exercise High Altitude Location New Move Menstrual Day Physical Illness Oversleep/Not enough sleep Weather changes Light: Photophobia or light sesnitivity treatment involves a balance between desensitization and reduction in overly strong input. Use dark polarized glasses outside, but not inside. Avoid bright or fluorescent light, but do not dim environment to the point that going into a normally lit room hurts. Consider FL-41 tint lenses, which reduce the most irritating wavelengths without blocking too much light.  These can be obtained at axonoptics.com or theraspecs.com Foods: see list above.  2. Limit use of acute treatments (over-the-counter medications, triptans, etc.) to no more than 2 days per week or 10 days per month to prevent medication overuse headache (rebound headache).    3. Follow a regular schedule (including weekends and holidays): Don't skip meals. Eat a balanced diet. 8 hours of sleep nightly. Minimize stress. Exercise 30 minutes per day. Being overweight is associated with a 5 times increased risk of chronic migraine. Keep well hydrated and drink 6-8 glasses of water per day.  4. Initiate non-pharmacologic measures at the earliest onset of your headache. Rest and quiet environment. Relax and reduce stress. Breathe2Relax is a free app that can instruct you on    some simple relaxtion and breathing techniques. Http://Dawnbuse.com is a    free website that provides teaching videos on relaxation.  Also, there are  many apps that   can  be downloaded for "mindful" relaxation.  An app called YOGA NIDRA will help walk you through mindfulness. Another app called Calm can be downloaded to give you a structured mindfulness guide with daily reminders and  skill development. Headspace for guided meditation Mindfulness Based Stress Reduction Online Course: www.palousemindfulness.com Cold compresses.  5. Don't wait!! Take the maximum allowable dosage of prescribed medication at the first sign of migraine.  6. Compliance:  Take prescribed medication regularly as directed and at the first sign of a migraine.  7. Communicate:  Call your physician when problems arise, especially if your headaches change, increase in frequency/severity, or become associated with neurological symptoms (weakness, numbness, slurred speech, etc.).  8. Headache/pain management therapies: Consider various complementary methods, including medication, behavioral therapy, psychological counselling, biofeedback, massage therapy, acupuncture, dry needling, and other modalities.  Such measures may reduce the need for medications. Counseling for pain management, where patients learn to function and ignore/minimize their pain, seems to work very well.  9. Recommend changing family's attention and focus away from patient's headaches. Instead, emphasize daily activities. If first question of day is 'How are your headaches/Do you have a headache today?', then patient will constantly think about headaches, thus making them worse. Goal is to re-direct attention away from headaches, toward daily activities and other distractions.  10. Helpful Websites: www.AmericanHeadacheSociety.org PatentHood.ch www.headaches.org TightMarket.nl www.achenet.org  11. HEADACHE EXPECTATIONS: There are many types of headaches, and only a rare few in which complete relief can be expected.  In general, there is no cure for headache, especially migraine based headaches.  There is nothing available that completely prevents headaches from occurring, breaking through, or having periodic flare-ups and fluctuations.  Regardless of what you are using on a daily basis for prevention, episodic headaches  should still be expected, and periods where frequency may escalate and fluctuate are unavoidable.   There is no quick fix for most headaches.  Furthermore, the longer you have had high frequency headaches (such as chronic daily headache), the longer it will likely take to expect any improvement.  In fact, some people will never improve, regardless of how many medications or other treatments we try.  Our treatment strategy is to evaluate for possible causes of your headache, although testing is usually always normal, even in cases of daily continuous headaches for years.  Most types of headache such as migraine are electrical brain disorders (similar to how epilepsy is an electrical brain disorders).  Therefore, there is no testing that will reveal this "dysfunctional electrical circuitry" such on MRI, or other testing.  We try to find a medication that may help lessen the frequency and/or severity of your headaches.  The goal is not to completely stop them from happening, although if that happens, great!  Different people respond to different medications, and some people just don't respond to anything, so it's usually a matter of trying different options.  We cannot predict if or when exactly you will respond to a treatment that we provide.   Preventive headache medications take 4-6 weeks to start working, and 2-3 months to see full effect, assuming you reach an effective dose.  Therefore, calling or messaging frequently because you have a headache flare prior to the 3 month mark is unlikely to change anything, and unfortunately there is nothing available that will expedite this, so please try to avoid this.  Our recommendation will generally be to give it adequate time first.  If you are unable to wait it out for medications to work,  we can also try IV infusions for some temporary relief.     In general, the best that preventive medications or other treatments (including Botox) are able to offer in migraine  management (variable in other headache types) is a 50% improvement in frequency and/or severity of headache.  That is our goal, and any additional benefit is considered a bonus.  Some people do significantly better than this, others do not get close to this.  Therefore, if your headaches are not improving by at least 3 months on your preventive strategy, contact us and we can discuss further adjustments.  Keep in mind that complete headache cure is not a realistic expectation.  Refills: Please pay attention to when your refills will need to be renewed. Due to the volume of phone calls daily, this could potentially take a few days, although we certainly try to honor your refill requests as soon as we can.  You should call at least 1 week in advance of needing a refill to ensure you do not run out of medication.  Keep in mind that refill requests on Fridays may not be filled until the following week.

## 2022-07-21 ENCOUNTER — Encounter: Payer: Self-pay | Admitting: Psychiatry

## 2022-07-21 ENCOUNTER — Telehealth: Payer: Self-pay | Admitting: Psychiatry

## 2022-07-21 NOTE — Telephone Encounter (Signed)
[facility] NPR case [id number] sent to [facility] per patient request

## 2022-07-22 ENCOUNTER — Other Ambulatory Visit (HOSPITAL_COMMUNITY): Payer: 59

## 2022-07-22 ENCOUNTER — Ambulatory Visit (HOSPITAL_COMMUNITY)
Admission: RE | Admit: 2022-07-22 | Discharge: 2022-07-22 | Disposition: A | Discharge status: Home / Self Care | Payer: 59 | Source: Ambulatory Visit | Attending: Psychiatry | Admitting: Psychiatry

## 2022-07-22 DIAGNOSIS — R29818 Other symptoms and signs involving the nervous system: Secondary | ICD-10-CM | POA: Insufficient documentation

## 2022-07-22 DIAGNOSIS — R519 Headache, unspecified: Secondary | ICD-10-CM | POA: Insufficient documentation

## 2022-07-22 MED ORDER — GADOBUTROL 1 MMOL/ML IV SOLN
10.0000 mL | Freq: Once | INTRAVENOUS | Status: AC | PRN
  Administered 2022-07-22: 10 mL via INTRAVENOUS

## 2022-08-06 ENCOUNTER — Encounter (INDEPENDENT_AMBULATORY_CARE_PROVIDER_SITE_OTHER): Payer: Self-pay

## 2022-08-11 ENCOUNTER — Other Ambulatory Visit (HOSPITAL_COMMUNITY): Payer: Self-pay

## 2022-08-21 DIAGNOSIS — E785 Hyperlipidemia, unspecified: Secondary | ICD-10-CM | POA: Diagnosis not present

## 2022-08-21 DIAGNOSIS — Z01818 Encounter for other preprocedural examination: Secondary | ICD-10-CM | POA: Diagnosis not present

## 2022-08-25 DIAGNOSIS — Z01411 Encounter for gynecological examination (general) (routine) with abnormal findings: Secondary | ICD-10-CM | POA: Diagnosis not present

## 2022-08-25 DIAGNOSIS — Z124 Encounter for screening for malignant neoplasm of cervix: Secondary | ICD-10-CM | POA: Diagnosis not present

## 2022-08-25 DIAGNOSIS — Z113 Encounter for screening for infections with a predominantly sexual mode of transmission: Secondary | ICD-10-CM | POA: Diagnosis not present

## 2022-08-25 DIAGNOSIS — Z0142 Encounter for cervical smear to confirm findings of recent normal smear following initial abnormal smear: Secondary | ICD-10-CM | POA: Diagnosis not present

## 2022-08-25 DIAGNOSIS — Z01419 Encounter for gynecological examination (general) (routine) without abnormal findings: Secondary | ICD-10-CM | POA: Diagnosis not present

## 2022-08-25 DIAGNOSIS — Z6838 Body mass index (BMI) 38.0-38.9, adult: Secondary | ICD-10-CM | POA: Diagnosis not present

## 2022-08-27 ENCOUNTER — Other Ambulatory Visit (HOSPITAL_COMMUNITY): Payer: Self-pay

## 2022-08-28 ENCOUNTER — Ambulatory Visit: Payer: Self-pay | Admitting: Surgery

## 2022-08-28 ENCOUNTER — Ambulatory Visit
Admission: RE | Admit: 2022-08-28 | Discharge: 2022-08-28 | Disposition: A | Discharge status: Home / Self Care | Payer: 59 | Source: Ambulatory Visit | Attending: Surgery | Admitting: Surgery

## 2022-08-28 DIAGNOSIS — I4510 Unspecified right bundle-branch block: Secondary | ICD-10-CM | POA: Diagnosis not present

## 2022-08-28 DIAGNOSIS — I498 Other specified cardiac arrhythmias: Secondary | ICD-10-CM | POA: Diagnosis not present

## 2022-08-28 DIAGNOSIS — Z01810 Encounter for preprocedural cardiovascular examination: Secondary | ICD-10-CM | POA: Diagnosis not present

## 2022-08-28 DIAGNOSIS — Z01818 Encounter for other preprocedural examination: Secondary | ICD-10-CM | POA: Diagnosis not present

## 2022-09-02 ENCOUNTER — Other Ambulatory Visit (HOSPITAL_COMMUNITY): Payer: Self-pay

## 2022-09-02 ENCOUNTER — Encounter: Payer: Self-pay | Admitting: Psychiatry

## 2022-09-02 ENCOUNTER — Other Ambulatory Visit: Payer: Self-pay | Admitting: *Deleted

## 2022-09-02 MED ORDER — TOPIRAMATE 100 MG PO TABS
100.0000 mg | ORAL_TABLET | Freq: Every day | ORAL | 5 refills | Status: DC
  Filled 2022-09-02: qty 30, 30d supply, fill #0
  Filled 2022-10-06: qty 30, 30d supply, fill #1
  Filled 2022-11-12: qty 30, 30d supply, fill #2
  Filled 2022-12-23: qty 30, 30d supply, fill #3
  Filled 2023-01-19 (×2): qty 30, 30d supply, fill #4
  Filled 2023-02-23: qty 30, 30d supply, fill #5

## 2022-09-08 ENCOUNTER — Other Ambulatory Visit (HOSPITAL_COMMUNITY): Payer: Self-pay

## 2022-09-15 ENCOUNTER — Encounter: Payer: Self-pay | Admitting: Dietician

## 2022-09-15 ENCOUNTER — Encounter: Payer: 59 | Attending: Surgery | Admitting: Dietician

## 2022-09-15 DIAGNOSIS — E669 Obesity, unspecified: Secondary | ICD-10-CM | POA: Insufficient documentation

## 2022-09-15 NOTE — Progress Notes (Signed)
Nutrition Assessment for Bariatric Surgery Medical Nutrition Therapy Appt Start Time: 4:05    End Time: 5:04  Patient was seen on 09/15/2022 for Pre-Operative Nutrition Assessment. Letter of approval faxed to [facility] bariatric surgery program coordinator on 09/15/2022.   Referral stated Supervised Weight Loss (SWL) visits needed: 0  Pt completed visits.   Pt has cleared nutrition requirements.   Planned surgery: Sleeve Gastrectomy Pt expectation of dietitian: tips or tricks to eating well    NUTRITION ASSESSMENT   Anthropometrics  Start weight at [facility]: 225.7 lbs (date: 09/15/2022)  Height: 64 in BMI: 38.74 kg/m2     Clinical  Medical hx: Constipation, obesity, hyperlipidemia, thyroid disease Medications: Linzess, Topamax, levothyroxine, Vit D  Labs: Chol 237; LDL 163; A1C 6.0 Notable signs/symptoms: none noted Any previous deficiencies? No  Micronutrient Nutrition Focused Physical Exam: Hair: No issues observed Eyes: No issues observed Mouth: No issues observed Neck: No issues observed Nails: No issues observed Skin: No issues observed  Lifestyle & Dietary Hx  Patient lives with her husband. The pt and her husband performs the food shopping and the pt prepares the meals. She reports that she typically skips or misses 1 out of 21 possible meals per week. She may have 1 meal per week that are take-out or at a restaurant.  Patient works as Web designer. She denies binge eating and denies feeling shame and/or guilt after eating too much food.  She denies having used laxatives or vomiting to facilitate weight loss. She denies emotional eating during times of stress. She states that she knows the difference between hunger and thirst and can tell when she is full or reached satisfaction. Pt states she takes Linzess for constipation, stating she has always had constipation.  Pt states she has a home gym that she can pick up and start doing again,  stating she is not afraid of physical activity.  Physical Activity: Walking daily, in the morning at work, 15-20-minute walk   Sleep Hygiene: duration and quality: 8 hours typically, good quality  Current Patient Perceived Stress Level as stated by pt on a scale of 1-10: 1      Stress Management Techniques: count blessings; go for a walk  Fruit servings per week: 5 Non starchy vegetable servings per week: 7 Whole Grains per week: 1   24-Hr Dietary Recall First Meal: scrambled eggs, [place] sausage Snack: fruit Second Meal: salad with lettuce, cucumber, egg, cheese, chicken Snack: 100 calorie Special K snack pack Third Meal: Chicken, cabbage or broccoli, and starch Snack: yogurt or 100 calorie ice cream ([place]asso) Beverages: water, sparkling ice, green tea  Alcoholic beverages per week: 0   Estimated Energy Needs Calories: 1500   NUTRITION DIAGNOSIS  Overweight/obesity ([place]-3.3) related to past poor dietary habits and physical inactivity as evidenced by patient w/ planned sleeve gastrectomy surgery following dietary guidelines for continued weight loss.    NUTRITION INTERVENTION  Nutrition counseling (C-1) and education (E-2) to facilitate bariatric surgery goals.  Educated pt on micronutrient deficiencies post surgery and strategies to mitigate that risk   Pre-Op Goals Reviewed with the Patient Track food and beverage intake (pen and paper, MyFitness Pal, Baritastic app, etc.) Make healthy food choices while monitoring portion sizes Consume 3 meals per day or try to eat every 3-5 hours Avoid concentrated sugars and fried foods Keep sugar & fat in the single digits per serving on food labels Practice CHEWING your food (aim for applesauce consistency) Practice not drinking 15 minutes before, during,  and 30 minutes after each meal and snack Avoid all carbonated beverages (ex: soda, sparkling beverages)  Limit caffeinated beverages (ex: coffee, tea, energy drinks) Avoid all  sugar-sweetened beverages (ex: regular soda, sports drinks)  Avoid alcohol  Aim for 64-100 ounces of FLUID daily (with at least half of fluid intake being plain water)  Aim for at least 60-80 grams of PROTEIN daily Look for a liquid protein source that contains ?15 g protein and ?5 g carbohydrate (ex: shakes, drinks, shots) Make a list of non-food related activities Physical activity is an important part of a healthy lifestyle so keep it moving! The goal is to reach 150 minutes of exercise per week, including cardiovascular and weight baring activity.  *Goals that are bolded indicate the pt would like to start working towards these  Handouts Provided Include  Bariatric Surgery handouts (Nutrition Visits, Pre-Op Goals, Protein Shakes, Vitamins & Minerals)  Learning Style & Readiness for Change Teaching method utilized: Visual & Auditory  Demonstrated degree of understanding via: Teach Back  Readiness Level: preparation  Barriers to learning/adherence to lifestyle change: none identified   RD's Notes for Next Visit     MONITORING & EVALUATION Dietary intake, weekly physical activity, body weight, and pre-op goals reached at next nutrition visit.    Next Steps  Pt has completed visits. No further supervised visits required/recomended  Patient is to follow up at [facility] for Pre-Op Class >2 weeks before surgery for further nutrition education.

## 2022-09-23 ENCOUNTER — Encounter: Payer: Self-pay | Admitting: Psychiatry

## 2022-09-26 ENCOUNTER — Other Ambulatory Visit (HOSPITAL_BASED_OUTPATIENT_CLINIC_OR_DEPARTMENT_OTHER): Payer: Self-pay

## 2022-09-26 ENCOUNTER — Other Ambulatory Visit (HOSPITAL_COMMUNITY): Payer: Self-pay

## 2022-10-07 ENCOUNTER — Other Ambulatory Visit (HOSPITAL_COMMUNITY): Payer: Self-pay

## 2022-10-10 ENCOUNTER — Other Ambulatory Visit (HOSPITAL_COMMUNITY): Payer: Self-pay

## 2022-10-14 ENCOUNTER — Other Ambulatory Visit (HOSPITAL_COMMUNITY): Payer: Self-pay

## 2022-10-15 ENCOUNTER — Other Ambulatory Visit (HOSPITAL_COMMUNITY): Payer: Self-pay

## 2022-10-22 ENCOUNTER — Encounter (HOSPITAL_COMMUNITY): Payer: Self-pay | Admitting: Surgery

## 2022-10-23 ENCOUNTER — Encounter (HOSPITAL_COMMUNITY): Payer: Self-pay | Admitting: Surgery

## 2022-10-24 ENCOUNTER — Ambulatory Visit (HOSPITAL_COMMUNITY)
Admission: RE | Admit: 2022-10-24 | Discharge: 2022-10-24 | Disposition: A | Discharge status: Home / Self Care | Payer: 59 | Source: Ambulatory Visit | Attending: Surgery | Admitting: Surgery

## 2022-10-24 ENCOUNTER — Ambulatory Visit (HOSPITAL_COMMUNITY): Payer: 59 | Admitting: Certified Registered"

## 2022-10-24 ENCOUNTER — Encounter (HOSPITAL_COMMUNITY)
Admission: RE | Disposition: A | Discharge status: Home / Self Care | Payer: Self-pay | Source: Ambulatory Visit | Attending: Surgery

## 2022-10-24 ENCOUNTER — Other Ambulatory Visit: Payer: Self-pay

## 2022-10-24 ENCOUNTER — Ambulatory Visit (HOSPITAL_BASED_OUTPATIENT_CLINIC_OR_DEPARTMENT_OTHER): Payer: 59 | Admitting: Certified Registered"

## 2022-10-24 DIAGNOSIS — E079 Disorder of thyroid, unspecified: Secondary | ICD-10-CM | POA: Diagnosis not present

## 2022-10-24 DIAGNOSIS — R7303 Prediabetes: Secondary | ICD-10-CM | POA: Insufficient documentation

## 2022-10-24 DIAGNOSIS — R1310 Dysphagia, unspecified: Secondary | ICD-10-CM | POA: Insufficient documentation

## 2022-10-24 DIAGNOSIS — E039 Hypothyroidism, unspecified: Secondary | ICD-10-CM | POA: Insufficient documentation

## 2022-10-24 DIAGNOSIS — Z13810 Encounter for screening for upper gastrointestinal disorder: Secondary | ICD-10-CM | POA: Diagnosis not present

## 2022-10-24 DIAGNOSIS — E0500 Thyrotoxicosis with diffuse goiter without thyrotoxic crisis or storm: Secondary | ICD-10-CM | POA: Insufficient documentation

## 2022-10-24 DIAGNOSIS — Z01810 Encounter for preprocedural cardiovascular examination: Secondary | ICD-10-CM | POA: Insufficient documentation

## 2022-10-24 DIAGNOSIS — K219 Gastro-esophageal reflux disease without esophagitis: Secondary | ICD-10-CM | POA: Insufficient documentation

## 2022-10-24 DIAGNOSIS — K5900 Constipation, unspecified: Secondary | ICD-10-CM | POA: Diagnosis not present

## 2022-10-24 DIAGNOSIS — E559 Vitamin D deficiency, unspecified: Secondary | ICD-10-CM | POA: Diagnosis not present

## 2022-10-24 DIAGNOSIS — K589 Irritable bowel syndrome without diarrhea: Secondary | ICD-10-CM | POA: Insufficient documentation

## 2022-10-24 DIAGNOSIS — Z79899 Other long term (current) drug therapy: Secondary | ICD-10-CM | POA: Diagnosis not present

## 2022-10-24 DIAGNOSIS — Z6837 Body mass index (BMI) 37.0-37.9, adult: Secondary | ICD-10-CM | POA: Diagnosis not present

## 2022-10-24 DIAGNOSIS — E785 Hyperlipidemia, unspecified: Secondary | ICD-10-CM | POA: Insufficient documentation

## 2022-10-24 HISTORY — PX: ESOPHAGOGASTRODUODENOSCOPY: SHX5428

## 2022-10-24 HISTORY — PX: BIOPSY: SHX5522

## 2022-10-24 SURGERY — EGD (ESOPHAGOGASTRODUODENOSCOPY)
Anesthesia: Monitor Anesthesia Care

## 2022-10-24 MED ORDER — LACTATED RINGERS IV SOLN
INTRAVENOUS | Status: AC | PRN
  Administered 2022-10-24: 1000 mL via INTRAVENOUS

## 2022-10-24 MED ORDER — PROPOFOL 10 MG/ML IV BOLUS
INTRAVENOUS | Status: DC | PRN
  Administered 2022-10-24: 30 mg via INTRAVENOUS
  Administered 2022-10-24: 70 mg via INTRAVENOUS
  Administered 2022-10-24: 30 mg via INTRAVENOUS

## 2022-10-24 MED ORDER — PROPOFOL 500 MG/50ML IV EMUL
INTRAVENOUS | Status: DC | PRN
  Administered 2022-10-24: 100 ug/kg/min via INTRAVENOUS

## 2022-10-24 MED ORDER — LACTATED RINGERS IV SOLN: INTRAVENOUS | Status: DC

## 2022-10-24 NOTE — Op Note (Signed)
[facility] Patient Name: Susan Shields Procedure Date: 10/24/2022 MRN: [id number] Attending MD: Felicie Morn , ,  Date of Birth: 1981/12/02 CSN: [id number] Age: 41 Admit Type: Outpatient Procedure:                Upper GI endoscopy Indications:               Providers:                Felicie Morn, Dulcy Fanny, Janee Morn, Technician Referring MD:              Medicines:                Monitored Anesthesia Care Complications:            No immediate complications. Estimated Blood Loss:     Estimated blood loss was minimal. Procedure:                Pre-Anesthesia Assessment:                           - Prior to the procedure, a History and Physical                            was performed, and patient medications and                            allergies were reviewed. The patient is competent.                            The risks and benefits of the procedure and the                            sedation options and risks were discussed with the                            patient. All questions were answered and informed                            consent was obtained. Patient identification and                            proposed procedure were verified in the                            pre-procedure area. Mental Status Examination:                            alert and oriented. Airway Examination: normal                            oropharyngeal airway and neck mobility. Respiratory                            Examination: clear to auscultation. CV Examination:  normal. ASA Grade Assessment: II - A patient with                            mild systemic disease. After reviewing the risks                            and benefits, the patient was deemed in                            satisfactory condition to undergo the procedure.                            The anesthesia plan was to use monitored  anesthesia                            care (MAC). Immediately prior to administration of                            medications, the patient was re-assessed for                            adequacy to receive sedatives. The heart rate,                            respiratory rate, oxygen saturations, blood                            pressure, adequacy of pulmonary ventilation, and                            response to care were monitored throughout the                            procedure. The physical status of the patient was                            re-assessed after the procedure.                           After obtaining informed consent, the endoscope was                            passed under direct vision. Throughout the                            procedure, the patient's blood pressure, pulse, and                            oxygen saturations were monitored continuously. The                            [id number]-[id number] ([id number]) Olympus endoscope was introduced  through the mouth, and advanced to the second part                            of duodenum. The upper GI endoscopy was                            accomplished without difficulty. The patient                            tolerated the procedure well. Scope In: Scope Out: Findings:      The examined esophagus was normal.      The gastroesophageal flap valve was visualized endoscopically and       classified as Hill Grade I (prominent fold, tight to endoscope).      The entire examined stomach was normal. Biopsies were taken with a cold       forceps for Helicobacter pylori testing. Verification of patient       identification for the specimen was done. Estimated blood loss was       minimal.      The in the duodenum was normal. Impression:               - Normal esophagus.                           - Gastroesophageal flap valve classified as Hill                            Grade I (prominent fold, tight  to endoscope).                           - Normal stomach. Biopsied.                           - Normal. Moderate Sedation:      Moderate (conscious) sedation was personally administered by an       anesthesia professional. The following parameters were monitored: oxygen       saturation, heart rate, blood pressure, and response to care. Total       physician intraservice time was 15 minutes. Recommendation:            Procedure Code(s):        --- Professional ---                           [phone], Esophagogastroduodenoscopy, flexible,                            transoral; with biopsy, single or multiple Diagnosis Code(s):        --- Professional ---                           Z13.810, Encounter for screening for upper                            gastrointestinal disorder CPT copyright 2022 American Medical Association. All rights reserved. The codes documented in this report are preliminary and upon coder review may  be revised  to meet current compliance requirements. [place],  10/24/2022 3:19:50 PM This report has been signed electronically. Number of Addenda: 0

## 2022-10-24 NOTE — H&P (Signed)
Admitting Physician: Nickola Major Nori Poland  Service: Bariatric surgery  CC: Obesity  Subjective   HPI: Susan Shields is an 41 y.o. female who is here for EGD prior to bariatric surgery.  She deals with some dysphagia, no major heartburn symptoms.  Past Medical History:  Diagnosis Date   Constipation    Graves disease    Hyperlipidemia    IBS (irritable bowel syndrome)    Migraine    Thyroid disease    Vitamin D deficiency     Past Surgical History:  Procedure Laterality Date   CESAREAN SECTION     COLONOSCOPY      Family History  Problem Relation Age of Onset   Diabetes Mother    Cancer Mother        MMT Cancer   Obesity Mother    Throat cancer Father    Sudden death Father    Stroke Father    Migraines Sister    Healthy Sister    Healthy Sister    Healthy Brother    Healthy Brother    Healthy Brother    Healthy Son     Social:  reports that she has never smoked. She has never used smokeless tobacco. She reports current alcohol use. She reports that she does not use drugs.  Allergies:  Allergies  Allergen Reactions   Simvastatin Other (See Comments)    fatigue   Tetanus-Diphth-Acell Pertussis Other (See Comments)    swelling    Medications: Current Outpatient Medications  Medication Instructions   levothyroxine (SYNTHROID) 125 MCG tablet Take 1 tablet by mouth once daily in the morning on an empty stomach   linaclotide (LINZESS) 290 MCG CAPS capsule TAKE 1 CAPSULE BY MOUTH ONCE DAILY BEFORE BREAKFAST   SUMAtriptan (IMITREX) 50 mg, Oral, Every 2 hours PRN, May repeat in 2 hours if headache persists or recurs.   topiramate (TOPAMAX) 100 mg, Oral, Daily at bedtime   Vitamin D, Ergocalciferol, (DRISDOL) 1.25 MG (50000 UNIT) CAPS capsule Take 1 capsule by mouth once a week.    ROS - all of the below systems have been reviewed with the patient and positives are indicated with bold text General: chills, fever or night sweats Eyes: blurry vision or  double vision ENT: epistaxis or sore throat Allergy/Immunology: itchy/watery eyes or nasal congestion Hematologic/Lymphatic: bleeding problems, blood clots or swollen lymph nodes Endocrine: temperature intolerance or unexpected weight changes Breast: new or changing breast lumps or nipple discharge Resp: cough, shortness of breath, or wheezing CV: chest pain or dyspnea on exertion GI: as per HPI GU: dysuria, trouble voiding, or hematuria MSK: joint pain or joint stiffness Neuro: TIA or stroke symptoms Derm: pruritus and skin lesion changes Psych: anxiety and depression  Objective   PE Blood pressure (!) 148/89, pulse 74, temperature (!) 96.9 F (36.1 C), temperature source Temporal, resp. rate 16, height 5\' 4"  (1.626 m), weight 99.8 kg, last menstrual period 10/20/2022, SpO2 100 %. Constitutional: NAD; conversant; no deformities Eyes: Moist conjunctiva; no lid lag; anicteric; PERRL Neck: Trachea midline; no thyromegaly Lungs: Normal respiratory effort; no tactile fremitus CV: RRR; no palpable thrills; no pitting edema GI: Abd Soft, nontender; no palpable hepatosplenomegaly MSK: Normal range of motion of extremities; no clubbing/cyanosis Psychiatric: Appropriate affect; alert and oriented x3 Lymphatic: No palpable cervical or axillary lymphadenopathy  No results found for this or any previous visit (from the past 24 hour(s)).  Imaging Orders  No imaging studies ordered today     Assessment and Plan  Susan Shields is a 41 y.o. female who is seen for bariatric surgery consultation. The patient has morbid obesity with a BMI of Body mass index is 37 kg/m. and the following conditions related to obesity: hyperlipidemia, prediabetes (A1C 6.0)  Today we discussed the surgical options to treat obesity and its associated comorbidity. After discussing the available procedures in the region, we discussed in great detail the surgeries I offer: robotic sleeve gastrectomy and robotic  roux-en-y gastric bypass. We discussed the procedures themselves as well as their risks, benefits and alternatives. I entered the patient's basic information into the [facility] Metabolic Surgery Risk/Benefit Calculator to facilitate this discussion.   After a full discussion and all questions answered, the patient is interested in pursuing a robotic sleeve gastrectomy with upper endoscopy.  She is interested in having surgery at [facility] if possible as she works at [employer].  Her preoperative pathway includes: - Bloodwork - Dietician consult - Chest x-ray - EKG - Psychology evaluation - Upper endoscopy with biopsy. I explained my rational for performing upper endoscopy in my bariatric patients. During the procedure I will biopsy for H. Pylori, evaluate for hiatal hernia, evaluate for reflux esophagitis and look for any other abnormalities that may influence the procedure. We discussed the risks, benefits and alternative to this procedure and the patient granted consent to proceed.   Today she presents for EGD.  We will proceed as scheduled.   Quentin Ore, MD  [facility] Use [email or web address] to contact on call provider

## 2022-10-24 NOTE — Anesthesia Preprocedure Evaluation (Signed)
Anesthesia Evaluation  Patient identified by MRN, date of birth, ID band Patient awake    Reviewed: Allergy & Precautions, NPO status , Patient's Chart, lab work & pertinent test results  Airway Mallampati: II  TM Distance: >3 FB Neck ROM: Full    Dental no notable dental hx.    Pulmonary neg pulmonary ROS,    Pulmonary exam normal breath sounds clear to auscultation       Cardiovascular negative cardio ROS Normal cardiovascular exam Rhythm:Regular Rate:Normal     Neuro/Psych negative neurological ROS  negative psych ROS   GI/Hepatic negative GI ROS, Neg liver ROS,   Endo/Other  Hypothyroidism   Renal/GU negative Renal ROS  negative genitourinary   Musculoskeletal negative musculoskeletal ROS (+)   Abdominal   Peds negative pediatric ROS (+)  Hematology negative hematology ROS (+)   Anesthesia Other Findings   Reproductive/Obstetrics negative OB ROS                             Anesthesia Physical Anesthesia Plan  ASA: 2  Anesthesia Plan: MAC   Post-op Pain Management:    Induction: Intravenous  PONV Risk Score and Plan: 2 and Propofol infusion and Treatment may vary due to age or medical condition  Airway Management Planned: Simple Face Mask  Additional Equipment:   Intra-op Plan:   Post-operative Plan:   Informed Consent: I have reviewed the patients History and Physical, chart, labs and discussed the procedure including the risks, benefits and alternatives for the proposed anesthesia with the patient or authorized representative who has indicated his/her understanding and acceptance.     Dental advisory given  Plan Discussed with: CRNA and Surgeon  Anesthesia Plan Comments:         Anesthesia Quick Evaluation

## 2022-10-24 NOTE — Transfer of Care (Signed)
Immediate Anesthesia Transfer of Care Note  Patient: Susan Shields  Procedure(s) Performed: ESOPHAGOGASTRODUODENOSCOPY (EGD) BIOPSY  Patient Location: PACU  Anesthesia Type:MAC  Level of Consciousness: awake, alert , oriented and patient cooperative  Airway & Oxygen Therapy: Patient Spontanous Breathing and Patient connected to face mask oxygen  Post-op Assessment: Report given to RN and Post -op Vital signs reviewed and stable  Post vital signs: Reviewed and stable  Last Vitals:  Vitals Value Taken Time  BP 123/82 10/24/22 1514  Temp 36.1 C 10/24/22 1514  Pulse 66 10/24/22 1514  Resp 24 10/24/22 1514  SpO2 100 % 10/24/22 1514  Vitals shown include unvalidated device data.  Last Pain:  Vitals:   10/24/22 1514  TempSrc: Tympanic  PainSc: Asleep         Complications: No notable events documented.

## 2022-10-24 NOTE — Anesthesia Postprocedure Evaluation (Signed)
Anesthesia Post Note  Patient: Susan Shields  Procedure(s) Performed: ESOPHAGOGASTRODUODENOSCOPY (EGD) BIOPSY     Patient location during evaluation: PACU Anesthesia Type: MAC Level of consciousness: awake and alert Pain management: pain level controlled Vital Signs Assessment: post-procedure vital signs reviewed and stable Respiratory status: spontaneous breathing, nonlabored ventilation, respiratory function stable and patient connected to nasal cannula oxygen Cardiovascular status: stable and blood pressure returned to baseline Postop Assessment: no apparent nausea or vomiting Anesthetic complications: no   No notable events documented.  Last Vitals:  Vitals:   10/24/22 1514 10/24/22 1524  BP: 123/82 (!) 143/98  Pulse: 62 60  Resp: 12 16  Temp: (!) 36.1 C   SpO2: 100% 100%    Last Pain:  Vitals:   10/24/22 1524  TempSrc:   PainSc: 0-No pain                 Amanii Snethen S

## 2022-10-26 ENCOUNTER — Encounter (HOSPITAL_COMMUNITY): Payer: Self-pay | Admitting: Surgery

## 2022-10-27 LAB — SURGICAL PATHOLOGY

## 2022-10-28 ENCOUNTER — Ambulatory Visit: Payer: Self-pay | Admitting: Surgery

## 2022-10-28 DIAGNOSIS — Z01818 Encounter for other preprocedural examination: Secondary | ICD-10-CM

## 2022-11-03 ENCOUNTER — Encounter: Payer: 59 | Attending: Surgery | Admitting: Skilled Nursing Facility1

## 2022-11-03 VITALS — Wt 222.8 lb

## 2022-11-03 DIAGNOSIS — Z6834 Body mass index (BMI) 34.0-34.9, adult: Secondary | ICD-10-CM | POA: Insufficient documentation

## 2022-11-03 DIAGNOSIS — E669 Obesity, unspecified: Secondary | ICD-10-CM | POA: Insufficient documentation

## 2022-11-03 NOTE — Progress Notes (Signed)
Pre-Operative Nutrition Class:    Patient was seen on 11/03/2022 for Pre-Operative Bariatric Surgery Education at the [facility].    Surgery date:  Surgery type: sleeve Start weight at [facility]: 225.7 Weight today: 222.8  Samples given per MNT protocol. Patient educated on appropriate usage:  [place] Lot # [id number] Exp: 9/26   Bariatric Advantage Calcium  Lot # [id number] Exp:11/18/2022   Ensure Max Protein Shake Lot # [id number] Exp: [id number]  The following the learning objectives were met by the patient during this course: Identify Pre-Op Dietary Goals and will begin 2 weeks pre-operatively Identify appropriate sources of fluids and proteins  State protein recommendations and appropriate sources pre and post-operatively Identify Post-Operative Dietary Goals and will follow for 2 weeks post-operatively Identify appropriate multivitamin and calcium sources Describe the need for physical activity post-operatively and will follow MD recommendations State when to call healthcare provider regarding medication questions or post-operative complications When having a diagnosis of diabetes understanding hypoglycemia symptoms and the inclusion of 1 complex carbohydrate per meal  Handouts given during class include: Pre-Op Bariatric Surgery Diet Handout Protein Shake Handout Post-Op Bariatric Surgery Nutrition Handout BELT Program Information Flyer Support Group Information Flyer [facility] Bariatric Supplements Price List  Follow-Up Plan: Patient will follow-up at [facility] 2 weeks post operatively for diet advancement per MD.

## 2022-11-06 ENCOUNTER — Ambulatory Visit: Payer: 59 | Admitting: Neurology

## 2022-11-09 ENCOUNTER — Other Ambulatory Visit: Payer: Self-pay | Admitting: Gastroenterology

## 2022-11-10 ENCOUNTER — Other Ambulatory Visit (HOSPITAL_COMMUNITY): Payer: Self-pay

## 2022-11-10 MED ORDER — LINACLOTIDE 290 MCG PO CAPS
290.0000 ug | ORAL_CAPSULE | Freq: Every day | ORAL | 1 refills | Status: DC
  Filled 2022-11-10: qty 90, 90d supply, fill #0
  Filled 2023-02-11: qty 90, 90d supply, fill #1

## 2022-11-10 NOTE — Telephone Encounter (Signed)
Patient is requesting Linzess. Please advise. 

## 2022-11-12 ENCOUNTER — Other Ambulatory Visit (HOSPITAL_COMMUNITY): Payer: Self-pay

## 2022-11-30 IMAGING — CR DG CHEST 2V
2 series · 2 of 2 positions shown · non-contrast
Comparison: Chest x-ray 01/22/2021

CLINICAL DATA: Chronic cough.

EXAM:
CHEST - 2 VIEW

[w chest pa]
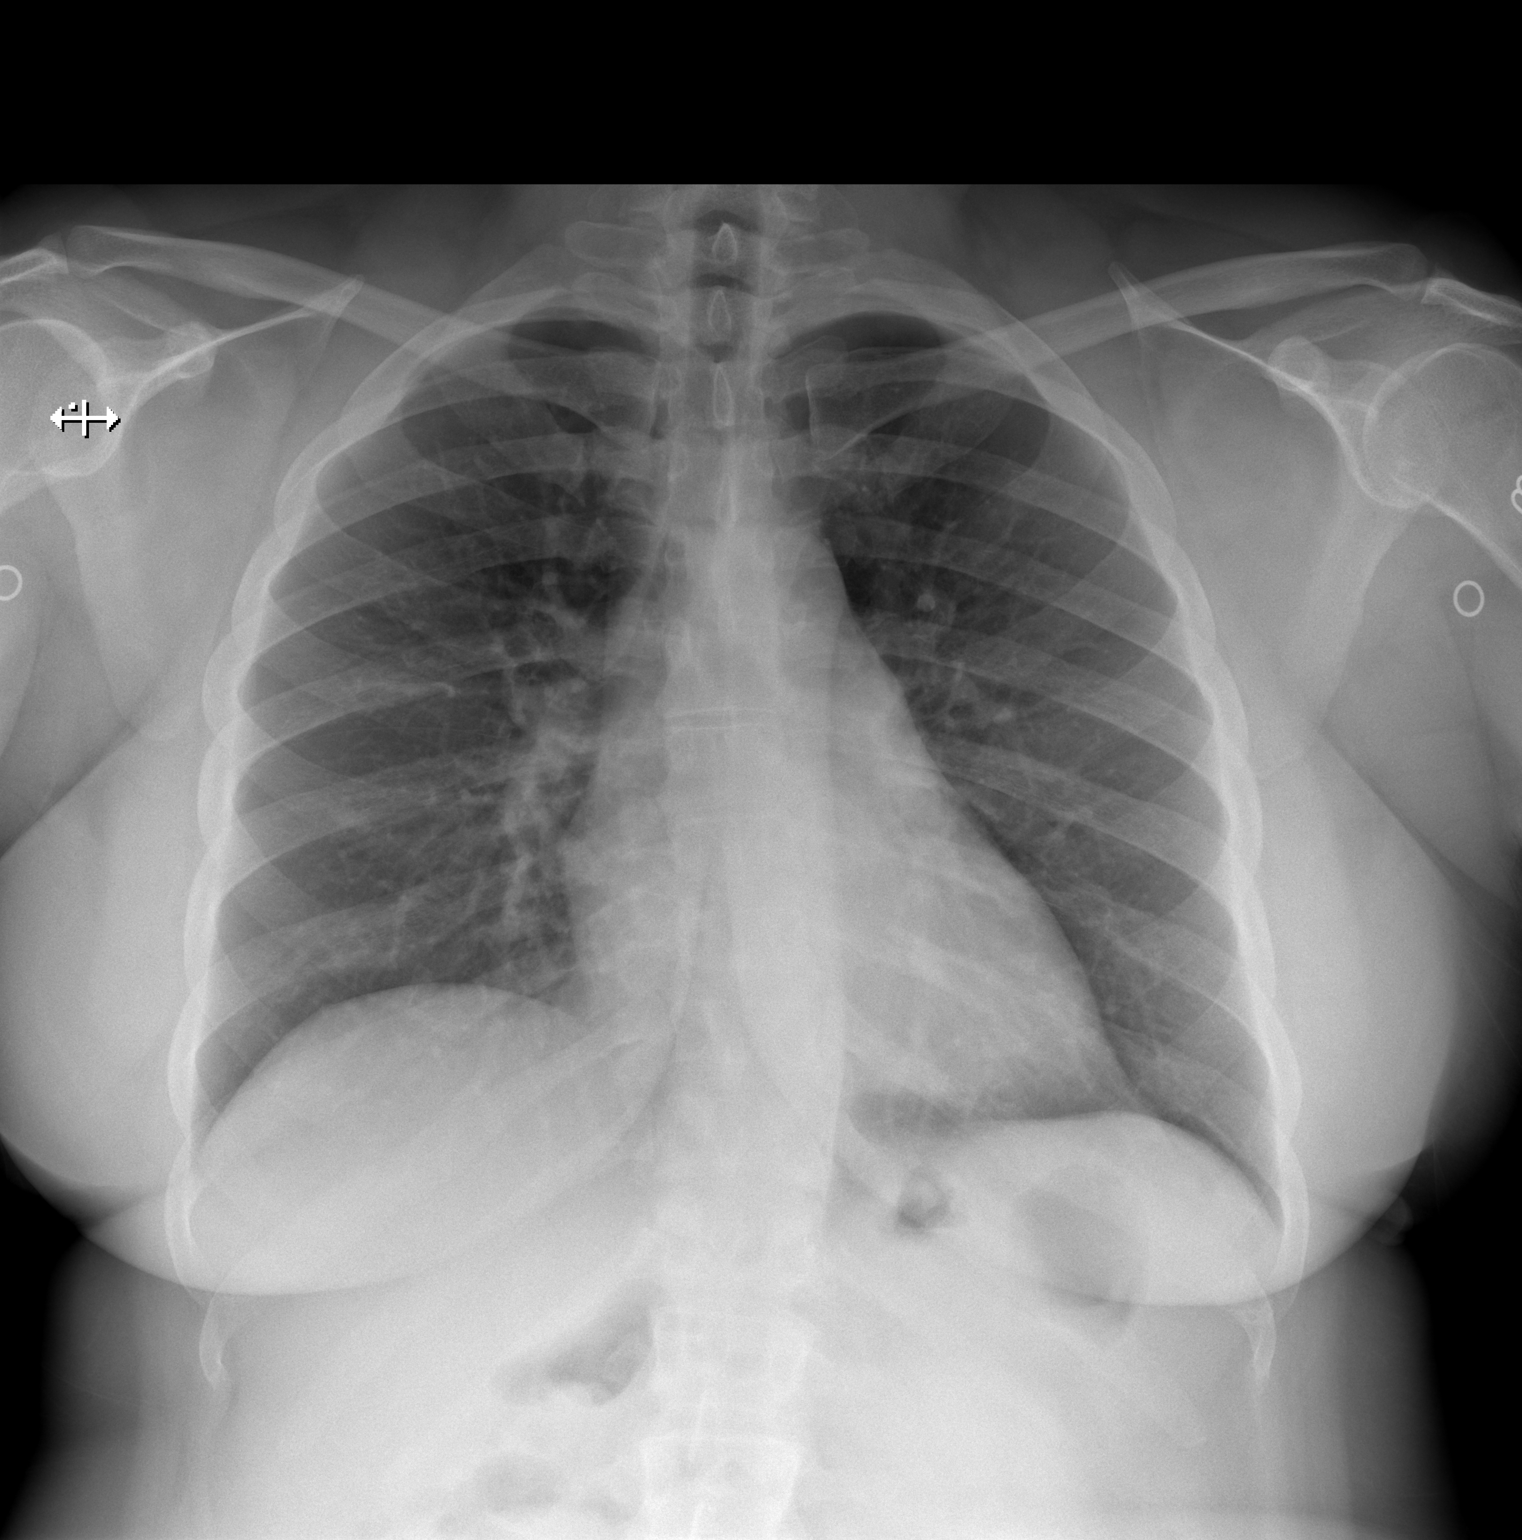

[w chest lat]
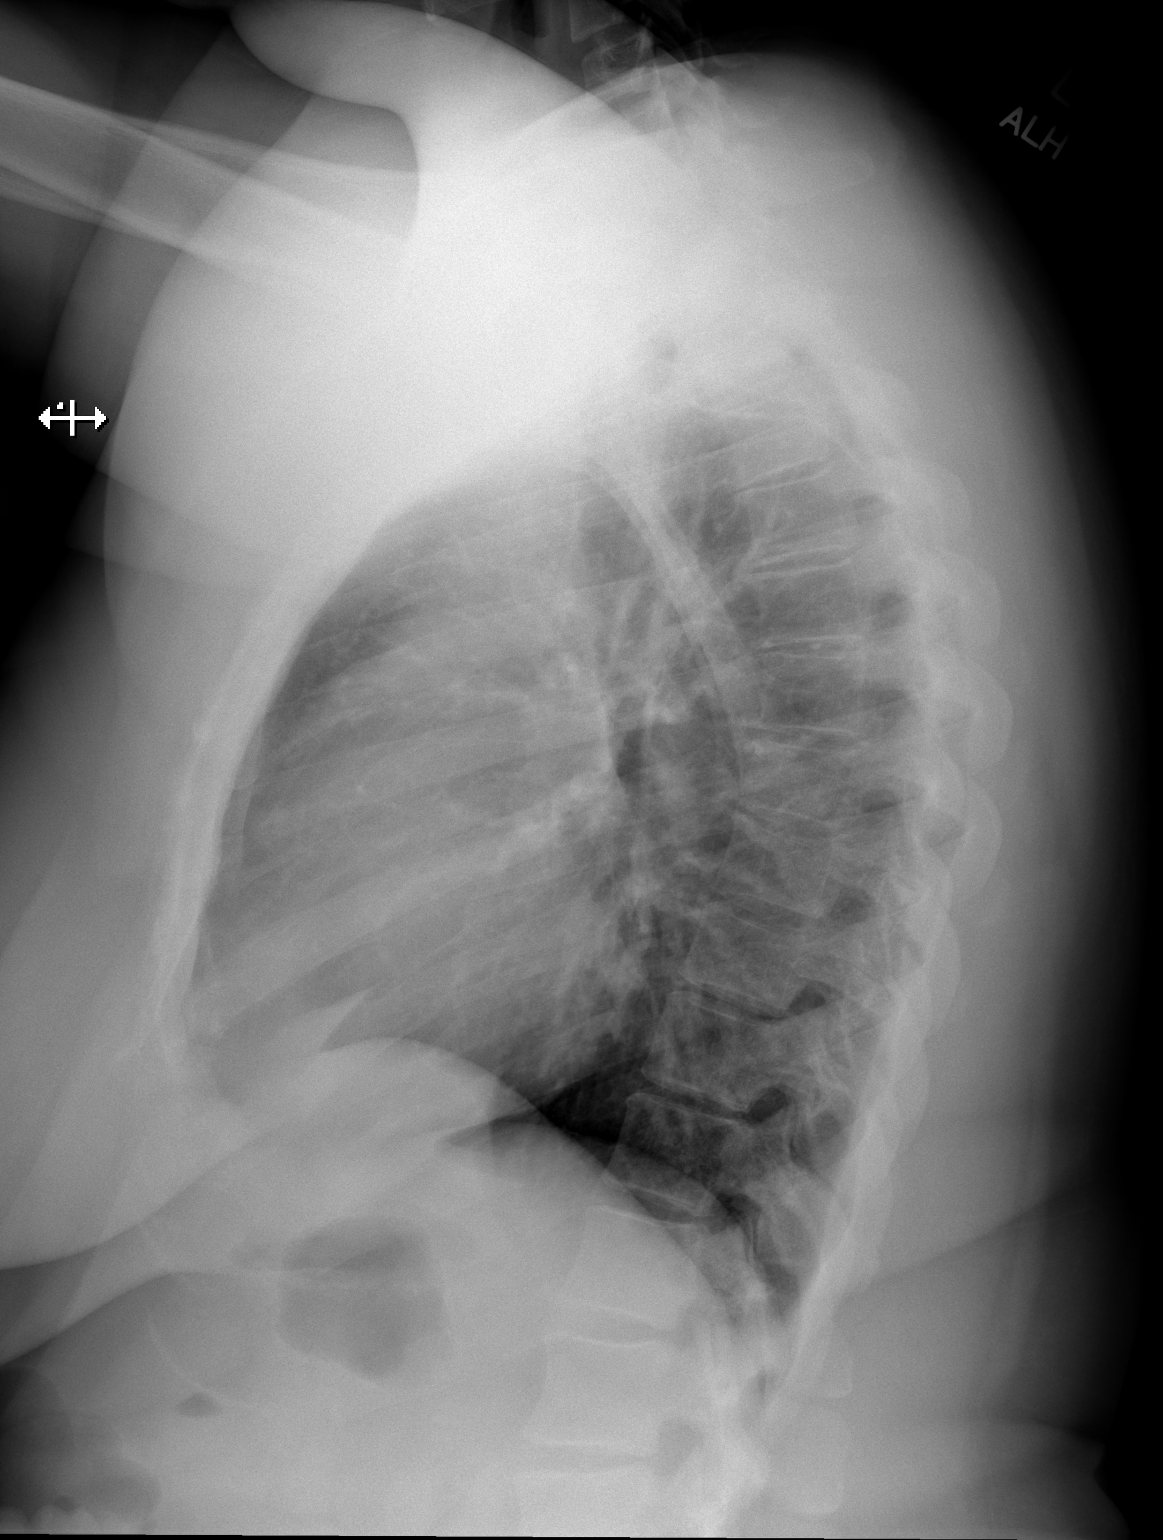

[2 of 2 positions shown; findings below may reference images not displayed]

FINDINGS: The heart size and mediastinal contours are within normal limits.
Both lungs are clear. The visualized skeletal structures are
unremarkable.
IMPRESSION: No active cardiopulmonary disease.

## 2022-11-30 NOTE — Progress Notes (Signed)
COVID Vaccine received:  _0  No _1  Yes Date of any COVID positive Test in last 90 days:  PCP - Donald Prose, MD Cardiologist - none  Chest x-ray - 03-06-2022 EKG -  08-28-2022 Stress Test -  ECHO -  Cardiac Cath -   PCR screen: _2  Ordered & Completed                      _3   No Order but Needs PROFEND                      _4   N/A for this surgery  Surgery Plan:  _5  Ambulatory                            _6  Outpatient in bed                            _7  Admit  Anesthesia:    _8  General  _9  Spinal                           _10   Choice _11   MAC  Bowel Prep - _12  No  _13   Yes _____________  Pacemaker / ICD device _14  No _15  Yes        Device order form faxed _16  No    _17   Yes      Faxed to:  Spinal Cord Stimulator:_18  No _19  Yes      (Remind patient to bring remote DOS) Other Implants:   History of Sleep Apnea? _20  No _21  Yes   CPAP used?- _22  No _23  Yes    PRE-DM, Last A1c on 08-28-2022 was 6.0 Does the patient monitor blood sugar? _24  No _25  Yes  _26  N/A Does patient have a [employer] or Dexacom? _27  No _28  Yes   Fasting Blood Sugar Ranges-  Checks Blood Sugar _____ times a day  Last dose of GLP1 agonist-  GLP1 instructions:  Last dose of SGLT-2 inhibitors-  SGLT-2 instructions:   Blood Thinner / Instructions:none Aspirin Instructions: none  ERAS Protocol Ordered: _29  No  _30  Yes _31  No Drink Ordered Patient is to be NPO after: 0900  Comments:   Activity level: Patient can / can not climb a flight of stairs without difficulty; _32  No CP  _33  No SOB, but would have ______   Patient can / can not perform ADLs without assistance.   Anesthesia review: Graves' disease, Pre-DM, migraine, ETOH  Patient denies shortness of breath, fever, cough and chest pain at PAT appointment.  Patient verbalized understanding and agreement to the Pre-Surgical Instructions that were given to them at this PAT appointment. Patient was also educated of the need to review these PAT instructions  again prior to his/her surgery.I reviewed the appropriate phone numbers to call if they have any and questions or concerns.

## 2022-11-30 NOTE — Patient Instructions (Signed)
SURGICAL WAITING ROOM VISITATION Patients having surgery or a procedure may have no more than 2 support people in the waiting area - these visitors may rotate in the visitor waiting room.   Children under the age of 68 must have an adult with them who is not the patient. If the patient needs to stay at the hospital during part of their recovery, the visitor guidelines for inpatient rooms apply.  PRE-OP VISITATION  Pre-op nurse will coordinate an appropriate time for 1 support person to accompany the patient in pre-op.  This support person may not rotate.  This visitor will be contacted when the time is appropriate for the visitor to come back in the pre-op area.  Please refer to the [facility] website for the visitor guidelines for Inpatients (after your surgery is over and you are in a regular room).  You are not required to quarantine at this time prior to your surgery. However, you must do this: Hand Hygiene often Do NOT share personal items Notify your provider if you are in close contact with someone who has COVID or you develop fever 100.4 or greater, new onset of sneezing, cough, sore throat, shortness of breath or body aches.  If you test positive for Covid or have been in contact with anyone that has tested positive in the last 10 days please notify you surgeon.    Your procedure is scheduled on:  Monday  December 15, 2022  Report to [facility] Main Entrance: [place] entrance where the [employer] is available.   Report to admitting at: 05:15 AM  +++++Call this number if you have any questions or problems the morning of surgery [phone]  Do not eat food after Midnight the night prior to your surgery/procedure.  After Midnight you may have the following liquids until  04:30 AM DAY OF SURGERY  Clear Liquid Diet Water Black Coffee (sugar ok, NO MILK/CREAM OR CREAMERS)  Tea (sugar ok, NO MILK/CREAM OR CREAMERS) regular and decaf                              Plain Jell-O  with no fruit (NO RED)                                           Fruit ices (not with fruit pulp, NO RED)                                     Popsicles (NO RED)                                                                  Juice: apple, WHITE grape, WHITE cranberry Sports drinks like Gatorade or Powerade (NO RED)              FOLLOW BOWEL PREP AND ANY ADDITIONAL PRE OP INSTRUCTIONS YOU RECEIVED FROM YOUR SURGEON'S OFFICE!!!   Oral Hygiene is also important to reduce your risk of infection.        Remember - BRUSH YOUR TEETH  THE MORNING OF SURGERY WITH YOUR REGULAR TOOTHPASTE   Take ONLY these medicines the morning of surgery with A SIP OF WATER: Levothyroxine (Synthroid)  If You have been diagnosed with Sleep Apnea - Bring CPAP mask and tubing day of surgery. We will provide you with a CPAP machine on the day of your surgery.                   You may not have any metal on your body including hair pins, jewelry, and body piercing  Do not wear make-up, lotions, powders, perfumes or deodorant  Do not wear nail polish including gel and S&S, artificial / acrylic nails, or any other type of covering on natural nails including finger and toenails. If you have artificial nails, gel coating, etc., that needs to be removed by a nail salon, Please have this removed prior to surgery. Not doing so may mean that your surgery could be cancelled or delayed if the Surgeon or anesthesia staff feels like they are unable to monitor you safely.   Do not shave 48 hours prior to surgery to avoid nicks in your skin which may contribute to postoperative infections.   Contacts, Hearing Aids, dentures or bridgework may not be worn into surgery. DENTURES WILL BE REMOVED PRIOR TO SURGERY PLEASE DO NOT APPLY "Poly grip" OR ADHESIVES!!!  You may bring a small overnight bag with you on the day of surgery, only pack items that are not valuable .[facility] IS NOT RESPONSIBLE   FOR VALUABLES THAT ARE  LOST OR STOLEN.   Do not bring your home medications to the hospital. The Pharmacy will dispense medications listed on your medication list to you during your admission in the Hospital.  Special Instructions: Bring a copy of your healthcare power of attorney and living will documents the day of surgery, if you wish to have them scanned into your Ward Medical Records- EPIC  Please read over the following fact sheets you were given: IF YOU HAVE QUESTIONS ABOUT YOUR PRE-OP INSTRUCTIONS, PLEASE CALL [phone]  (KAY)   [facility] - Preparing for Surgery Before surgery, you can play an important role.  Because skin is not sterile, your skin needs to be as free of germs as possible.  You can reduce the number of germs on your skin by washing with CHG (chlorahexidine gluconate) soap before surgery.  CHG is an antiseptic cleaner which kills germs and bonds with the skin to continue killing germs even after washing. Please DO NOT use if you have an allergy to CHG or antibacterial soaps.  If your skin becomes reddened/irritated stop using the CHG and inform your nurse when you arrive at Short Stay. Do not shave (including legs and underarms) for at least 48 hours prior to the first CHG shower.  You may shave your face/neck.  Please follow these instructions carefully:  1.  Shower with CHG Soap the night before surgery and the  morning of surgery.  2.  If you choose to wash your hair, wash your hair first as usual with your normal  shampoo.  3.  After you shampoo, rinse your hair and body thoroughly to remove the shampoo.                             4.  Use CHG as you would any other liquid soap.  You can apply chg directly to the skin and wash.  Gently with a  scrungie or clean washcloth.  5.  Apply the CHG Soap to your body ONLY FROM THE NECK DOWN.   Do not use on face/ open                           Wound or open sores. Avoid contact with eyes, ears mouth and genitals (private parts).                        Wash face,  Genitals (private parts) with your normal soap.             6.  Wash thoroughly, paying special attention to the area where your  surgery  will be performed.  7.  Thoroughly rinse your body with warm water from the neck down.  8.  DO NOT shower/wash with your normal soap after using and rinsing off the CHG Soap.            9.  Pat yourself dry with a clean towel.            10.  Wear clean pajamas.            11.  Place clean sheets on your bed the night of your first shower and do not  sleep with pets.  ON THE DAY OF SURGERY : Do not apply any lotions/deodorants the morning of surgery.  Please wear clean clothes to the hospital/surgery center.    FAILURE TO FOLLOW THESE INSTRUCTIONS MAY RESULT IN THE CANCELLATION OF YOUR SURGERY  PATIENT SIGNATURE_________________________________  NURSE SIGNATURE__________________________________  ________________________________________________________________________         Rogelia MireIncentive Spirometer    An incentive spirometer is a tool that can help keep your lungs clear and active. This tool measures how well you are filling your lungs with each breath. Taking long deep breaths may help reverse or decrease the chance of developing breathing (pulmonary) problems (especially infection) following: A long period of time when you are unable to move or be active. BEFORE THE PROCEDURE  If the spirometer includes an indicator to show your best effort, your nurse or respiratory therapist will set it to a desired goal. If possible, sit up straight or lean slightly forward. Try not to slouch. Hold the incentive spirometer in an upright position. INSTRUCTIONS FOR USE  Sit on the edge of your bed if possible, or sit up as far as you can in bed or on a chair. Hold the incentive spirometer in an upright position. Breathe out normally. Place the mouthpiece in your mouth and seal your lips tightly around it. Breathe in slowly and as  deeply as possible, raising the piston or the ball toward the top of the column. Hold your breath for 3-5 seconds or for as long as possible. Allow the piston or ball to fall to the bottom of the column. Remove the mouthpiece from your mouth and breathe out normally. Rest for a few seconds and repeat Steps 1 through 7 at least 10 times every 1-2 hours when you are awake. Take your time and take a few normal breaths between deep breaths. The spirometer may include an indicator to show your best effort. Use the indicator as a goal to work toward during each repetition. After each set of 10 deep breaths, practice coughing to be sure your lungs are clear. If you have an incision (the cut made at the time of surgery), support your incision when coughing by  placing a pillow or rolled up towels firmly against it. Once you are able to get out of bed, walk around indoors and cough well. You may stop using the incentive spirometer when instructed by your caregiver.  RISKS AND COMPLICATIONS Take your time so you do not get dizzy or light-headed. If you are in pain, you may need to take or ask for pain medication before doing incentive spirometry. It is harder to take a deep breath if you are having pain. AFTER USE Rest and breathe slowly and easily. It can be helpful to keep track of a log of your progress. Your caregiver can provide you with a simple table to help with this. If you are using the spirometer at home, follow these instructions: SEEK MEDICAL CARE IF:  You are having difficultly using the spirometer. You have trouble using the spirometer as often as instructed. Your pain medication is not giving enough relief while using the spirometer. You develop fever of 100.5 F (38.1 C) or higher.                                                                                                    SEEK IMMEDIATE MEDICAL CARE IF:  You cough up bloody sputum that had not been present before. You develop fever  of 102 F (38.9 C) or greater. You develop worsening pain at or near the incision site. MAKE SURE YOU:  Understand these instructions. Will watch your condition. Will get help right away if you are not doing well or get worse. Document Released: 04/27/2007 Document Revised: 03/08/2012 Document Reviewed: 06/28/2007 [facility] Patient Information 2014 [place], [place].     WHAT IS A BLOOD TRANSFUSION? Blood Transfusion Information  A transfusion is the replacement of blood or some of its parts. Blood is made up of multiple cells which provide different functions. Red blood cells carry oxygen and are used for blood loss replacement. White blood cells fight against infection. Platelets control bleeding. Plasma helps clot blood. Other blood products are available for specialized needs, such as hemophilia or other clotting disorders. BEFORE THE TRANSFUSION  Who gives blood for transfusions?  Healthy volunteers who are fully evaluated to make sure their blood is safe. This is blood bank blood. Transfusion therapy is the safest it has ever been in the practice of medicine. Before blood is taken from a donor, a complete history is taken to make sure that person has no history of diseases nor engages in risky social behavior (examples are intravenous drug use or sexual activity with multiple partners). The donor's travel history is screened to minimize risk of transmitting infections, such as malaria. The donated blood is tested for signs of infectious diseases, such as HIV and hepatitis. The blood is then tested to be sure it is compatible with you in order to minimize the chance of a transfusion reaction. If you or a relative donates blood, this is often done in anticipation of surgery and is not appropriate for emergency situations. It takes many days to process the donated blood. RISKS AND COMPLICATIONS Although transfusion therapy is very safe  and saves many lives, the main dangers of transfusion  include:  Getting an infectious disease. Developing a transfusion reaction. This is an allergic reaction to something in the blood you were given. Every precaution is taken to prevent this. The decision to have a blood transfusion has been considered carefully by your caregiver before blood is given. Blood is not given unless the benefits outweigh the risks. AFTER THE TRANSFUSION Right after receiving a blood transfusion, you will usually feel much better and more energetic. This is especially true if your red blood cells have gotten low (anemic). The transfusion raises the level of the red blood cells which carry oxygen, and this usually causes an energy increase. The nurse administering the transfusion will monitor you carefully for complications. HOME CARE INSTRUCTIONS  No special instructions are needed after a transfusion. You may find your energy is better. Speak with your caregiver about any limitations on activity for underlying diseases you may have. SEEK MEDICAL CARE IF:  Your condition is not improving after your transfusion. You develop redness or irritation at the intravenous (IV) site. SEEK IMMEDIATE MEDICAL CARE IF:  Any of the following symptoms occur over the next 12 hours: Shaking chills. You have a temperature by mouth above 102 F (38.9 C), not controlled by medicine. Chest, back, or muscle pain. People around you feel you are not acting correctly or are confused. Shortness of breath or difficulty breathing. Dizziness and fainting. You get a rash or develop hives. You have a decrease in urine output. Your urine turns a dark color or changes to pink, red, or brown. Any of the following symptoms occur over the next 10 days: You have a temperature by mouth above 102 F (38.9 C), not controlled by medicine. Shortness of breath. Weakness after normal activity. The white part of the eye turns yellow (jaundice). You have a decrease in the amount of urine or are urinating  less often. Your urine turns a dark color or changes to pink, red, or brown. Document Released: 12/12/2000 Document Revised: 03/08/2012 Document Reviewed: 07/31/2008 [facility] Patient Information 2014 [place], [place].  _______________________________________________________________________

## 2022-12-02 ENCOUNTER — Encounter (HOSPITAL_COMMUNITY)
Admission: RE | Admit: 2022-12-02 | Discharge: 2022-12-02 | Disposition: A | Discharge status: Home / Self Care | Payer: 59 | Source: Ambulatory Visit | Attending: Surgery | Admitting: Surgery

## 2022-12-02 ENCOUNTER — Other Ambulatory Visit: Payer: Self-pay

## 2022-12-02 ENCOUNTER — Encounter (HOSPITAL_COMMUNITY): Payer: Self-pay

## 2022-12-02 VITALS — BP 124/91 | HR 68 | Temp 98.7°F | Resp 16 | Ht 64.0 in | Wt 218.0 lb

## 2022-12-02 DIAGNOSIS — R7303 Prediabetes: Secondary | ICD-10-CM | POA: Diagnosis not present

## 2022-12-02 DIAGNOSIS — Z01812 Encounter for preprocedural laboratory examination: Secondary | ICD-10-CM | POA: Diagnosis not present

## 2022-12-02 DIAGNOSIS — Z01818 Encounter for other preprocedural examination: Secondary | ICD-10-CM

## 2022-12-02 HISTORY — DX: Prediabetes: R73.03

## 2022-12-02 HISTORY — DX: Hypothyroidism, unspecified: E03.9

## 2022-12-02 HISTORY — DX: Unspecified osteoarthritis, unspecified site: M19.90

## 2022-12-02 LAB — CBC WITH DIFFERENTIAL/PLATELET
Abs Immature Granulocytes: 0.01 10*3/uL (ref 0.00–0.07)
Basophils Absolute: 0 10*3/uL (ref 0.0–0.1)
Basophils Relative: 0 %
Eosinophils Absolute: 0.4 10*3/uL (ref 0.0–0.5)
Eosinophils Relative: 4 %
HCT: 40.4 % (ref 36.0–46.0)
Hemoglobin: 13.1 g/dL (ref 12.0–15.0)
Immature Granulocytes: 0 %
Lymphocytes Relative: 35 %
Lymphs Abs: 3.1 10*3/uL (ref 0.7–4.0)
MCH: 26.4 pg (ref 26.0–34.0)
MCHC: 32.4 g/dL (ref 30.0–36.0)
MCV: 81.5 fL (ref 80.0–100.0)
Monocytes Absolute: 0.5 10*3/uL (ref 0.1–1.0)
Monocytes Relative: 6 %
Neutro Abs: 4.9 10*3/uL (ref 1.7–7.7)
Neutrophils Relative %: 55 %
Platelets: 216 10*3/uL (ref 150–400)
RBC: 4.96 MIL/uL (ref 3.87–5.11)
RDW: 16 % — ABNORMAL HIGH (ref 11.5–15.5)
WBC: 8.9 10*3/uL (ref 4.0–10.5)
nRBC: 0 % (ref 0.0–0.2)

## 2022-12-02 LAB — COMPREHENSIVE METABOLIC PANEL
ALT: 15 U/L (ref 0–44)
AST: 14 U/L — ABNORMAL LOW (ref 15–41)
Albumin: 3.9 g/dL (ref 3.5–5.0)
Alkaline Phosphatase: 46 U/L (ref 38–126)
Anion gap: 9 (ref 5–15)
BUN: 16 mg/dL (ref 6–20)
CO2: 20 mmol/L — ABNORMAL LOW (ref 22–32)
Calcium: 9.3 mg/dL (ref 8.9–10.3)
Chloride: 110 mmol/L (ref 98–111)
Creatinine, Ser: 1.02 mg/dL — ABNORMAL HIGH (ref 0.44–1.00)
GFR, Estimated: >60 mL/min (ref >60)
Glucose, Bld: 87 mg/dL (ref 70–99)
Potassium: 3.2 mmol/L — ABNORMAL LOW (ref 3.5–5.1)
Sodium: 139 mmol/L (ref 135–145)
Total Bilirubin: 0.5 mg/dL (ref 0.3–1.2)
Total Protein: 7.7 g/dL (ref 6.5–8.1)

## 2022-12-02 LAB — GLUCOSE, CAPILLARY: Glucose-Capillary: 94 mg/dL (ref 70–99)

## 2022-12-02 LAB — TYPE AND SCREEN
ABO/RH(D): B POS
Antibody Screen: NEGATIVE

## 2022-12-03 LAB — HEMOGLOBIN A1C
Hgb A1c MFr Bld: 5.8 % — ABNORMAL HIGH (ref 4.8–5.6)
Mean Plasma Glucose: 120 mg/dL

## 2022-12-10 ENCOUNTER — Other Ambulatory Visit (HOSPITAL_COMMUNITY): Payer: Self-pay

## 2022-12-10 DIAGNOSIS — R7303 Prediabetes: Secondary | ICD-10-CM | POA: Diagnosis not present

## 2022-12-10 DIAGNOSIS — Z6836 Body mass index (BMI) 36.0-36.9, adult: Secondary | ICD-10-CM | POA: Diagnosis not present

## 2022-12-10 DIAGNOSIS — E039 Hypothyroidism, unspecified: Secondary | ICD-10-CM | POA: Diagnosis not present

## 2022-12-10 DIAGNOSIS — E6609 Other obesity due to excess calories: Secondary | ICD-10-CM | POA: Diagnosis not present

## 2022-12-10 MED ORDER — LEVOTHYROXINE SODIUM 125 MCG PO TABS
125.0000 ug | ORAL_TABLET | Freq: Every morning | ORAL | 3 refills | Status: DC
  Filled 2022-12-10 – 2023-01-19 (×3): qty 90, 90d supply, fill #0
  Filled 2023-04-16: qty 90, 90d supply, fill #1
  Filled 2023-07-09: qty 90, 90d supply, fill #2
  Filled 2023-10-20: qty 90, 90d supply, fill #3

## 2022-12-14 NOTE — Anesthesia Preprocedure Evaluation (Signed)
Anesthesia Evaluation  Patient identified by MRN, date of birth, ID band Patient awake    Reviewed: Allergy & Precautions, NPO status , Patient's Chart, lab work & pertinent test results  Airway Mallampati: II  TM Distance: >3 FB Neck ROM: Full    Dental no notable dental hx. (+) Teeth Intact, Dental Advisory Given   Pulmonary    Pulmonary exam normal breath sounds clear to auscultation       Cardiovascular Exercise Tolerance: Good Normal cardiovascular exam Rhythm:Regular Rate:Normal     Neuro/Psych  Headaches    GI/Hepatic   Endo/Other  Hypothyroidism    Renal/GU Lab Results      Component                Value               Date                      CREATININE               1.02 (H)            12/02/2022                BUN                      16                  12/02/2022                NA                       139                 12/02/2022                K                        3.2 (L)             12/02/2022                CL                       110                 12/02/2022                CO2                      20 (L)              12/02/2022                Musculoskeletal  (+) Arthritis ,    Abdominal   Peds  Hematology Lab Results      Component                Value               Date                      WBC                      8.9                 12/02/2022  HGB                      13.1                12/02/2022                HCT                      40.4                12/02/2022                MCV                      81.5                12/02/2022                PLT                      216                 12/02/2022              Anesthesia Other Findings All: Simvastatin  Reproductive/Obstetrics negative OB ROS                             Anesthesia Physical Anesthesia Plan  ASA: 2  Anesthesia Plan: General   Post-op Pain Management: Ketamine  IV*, Lidocaine infusion* and Tylenol PO (pre-op)*   Induction: Intravenous  PONV Risk Score and Plan: Treatment may vary due to age or medical condition, Ondansetron, Midazolam and Dexamethasone  Airway Management Planned: Oral ETT  Additional Equipment: None  Intra-op Plan:   Post-operative Plan: Extubation in OR  Informed Consent: I have reviewed the patients History and Physical, chart, labs and discussed the procedure including the risks, benefits and alternatives for the proposed anesthesia with the patient or authorized representative who has indicated his/her understanding and acceptance.     Dental advisory given  Plan Discussed with: CRNA, Anesthesiologist and Surgeon  Anesthesia Plan Comments:         Anesthesia Quick Evaluation

## 2022-12-15 ENCOUNTER — Other Ambulatory Visit: Payer: Self-pay

## 2022-12-15 ENCOUNTER — Inpatient Hospital Stay (HOSPITAL_COMMUNITY): Payer: 59 | Admitting: Anesthesiology

## 2022-12-15 ENCOUNTER — Inpatient Hospital Stay (HOSPITAL_COMMUNITY)
Admission: RE | Admit: 2022-12-15 | Discharge: 2022-12-16 | DRG: 621 | Disposition: A | Discharge status: Home / Self Care | Payer: 59 | Attending: Surgery | Admitting: Surgery

## 2022-12-15 ENCOUNTER — Encounter (HOSPITAL_COMMUNITY)
Admission: RE | Disposition: A | Discharge status: Home / Self Care | Payer: Self-pay | Source: Home / Self Care | Attending: Surgery

## 2022-12-15 ENCOUNTER — Encounter (HOSPITAL_COMMUNITY): Payer: Self-pay | Admitting: Surgery

## 2022-12-15 DIAGNOSIS — Z833 Family history of diabetes mellitus: Secondary | ICD-10-CM | POA: Diagnosis not present

## 2022-12-15 DIAGNOSIS — Z888 Allergy status to other drugs, medicaments and biological substances status: Secondary | ICD-10-CM

## 2022-12-15 DIAGNOSIS — N736 Female pelvic peritoneal adhesions (postinfective): Secondary | ICD-10-CM | POA: Diagnosis not present

## 2022-12-15 DIAGNOSIS — M1990 Unspecified osteoarthritis, unspecified site: Secondary | ICD-10-CM | POA: Diagnosis not present

## 2022-12-15 DIAGNOSIS — Z823 Family history of stroke: Secondary | ICD-10-CM

## 2022-12-15 DIAGNOSIS — R7303 Prediabetes: Secondary | ICD-10-CM | POA: Diagnosis present

## 2022-12-15 DIAGNOSIS — E785 Hyperlipidemia, unspecified: Secondary | ICD-10-CM | POA: Diagnosis not present

## 2022-12-15 DIAGNOSIS — Z887 Allergy status to serum and vaccine status: Secondary | ICD-10-CM | POA: Diagnosis not present

## 2022-12-15 DIAGNOSIS — K660 Peritoneal adhesions (postprocedural) (postinfection): Secondary | ICD-10-CM | POA: Diagnosis not present

## 2022-12-15 DIAGNOSIS — E039 Hypothyroidism, unspecified: Secondary | ICD-10-CM | POA: Diagnosis not present

## 2022-12-15 DIAGNOSIS — Z79890 Hormone replacement therapy (postmenopausal): Secondary | ICD-10-CM | POA: Diagnosis not present

## 2022-12-15 DIAGNOSIS — Z6837 Body mass index (BMI) 37.0-37.9, adult: Secondary | ICD-10-CM

## 2022-12-15 DIAGNOSIS — E559 Vitamin D deficiency, unspecified: Secondary | ICD-10-CM | POA: Diagnosis not present

## 2022-12-15 DIAGNOSIS — Z01818 Encounter for other preprocedural examination: Secondary | ICD-10-CM

## 2022-12-15 DIAGNOSIS — Z6836 Body mass index (BMI) 36.0-36.9, adult: Secondary | ICD-10-CM

## 2022-12-15 DIAGNOSIS — Z808 Family history of malignant neoplasm of other organs or systems: Secondary | ICD-10-CM

## 2022-12-15 HISTORY — PX: UPPER GI ENDOSCOPY: SHX6162

## 2022-12-15 LAB — CBC
HCT: 41.6 % (ref 36.0–46.0)
Hemoglobin: 13.6 g/dL (ref 12.0–15.0)
MCH: 27.3 pg (ref 26.0–34.0)
MCHC: 32.7 g/dL (ref 30.0–36.0)
MCV: 83.5 fL (ref 80.0–100.0)
Platelets: 251 10*3/uL (ref 150–400)
RBC: 4.98 MIL/uL (ref 3.87–5.11)
RDW: 16 % — ABNORMAL HIGH (ref 11.5–15.5)
WBC: 11 10*3/uL — ABNORMAL HIGH (ref 4.0–10.5)
nRBC: 0 % (ref 0.0–0.2)

## 2022-12-15 LAB — ABO/RH: ABO/RH(D): B POS

## 2022-12-15 LAB — HEMOGLOBIN AND HEMATOCRIT, BLOOD
HCT: 41 % (ref 36.0–46.0)
Hemoglobin: 13.2 g/dL (ref 12.0–15.0)

## 2022-12-15 LAB — CREATININE, SERUM
Creatinine, Ser: 1.33 mg/dL — ABNORMAL HIGH (ref 0.44–1.00)
GFR, Estimated: 51 mL/min — ABNORMAL LOW (ref >60)

## 2022-12-15 LAB — POCT PREGNANCY, URINE: Preg Test, Ur: NEGATIVE

## 2022-12-15 SURGERY — XI ROBOTIC GASTRIC SLEEVE RESECTION
Anesthesia: General

## 2022-12-15 MED ORDER — CHLORHEXIDINE GLUCONATE CLOTH 2 % EX PADS: 6.0000 | MEDICATED_PAD | Freq: Once | CUTANEOUS | Status: DC

## 2022-12-15 MED ORDER — DEXAMETHASONE SODIUM PHOSPHATE 10 MG/ML IJ SOLN
INTRAMUSCULAR | Status: DC | PRN
  Administered 2022-12-15: 8 mg via INTRAVENOUS

## 2022-12-15 MED ORDER — ONDANSETRON HCL 4 MG/2ML IJ SOLN
INTRAMUSCULAR | Status: DC | PRN
  Administered 2022-12-15: 4 mg via INTRAVENOUS

## 2022-12-15 MED ORDER — FENTANYL CITRATE (PF) 250 MCG/5ML IJ SOLN
INTRAMUSCULAR | Status: AC
  Filled 2022-12-15: qty 5

## 2022-12-15 MED ORDER — MIDAZOLAM HCL 2 MG/2ML IJ SOLN
INTRAMUSCULAR | Status: AC
  Filled 2022-12-15: qty 2

## 2022-12-15 MED ORDER — HYDROMORPHONE HCL 1 MG/ML IJ SOLN
INTRAMUSCULAR | Status: AC
  Filled 2022-12-15: qty 1

## 2022-12-15 MED ORDER — ORAL CARE MOUTH RINSE: 15.0000 mL | Freq: Once | OROMUCOSAL | Status: AC

## 2022-12-15 MED ORDER — APREPITANT 40 MG PO CAPS
40.0000 mg | ORAL_CAPSULE | ORAL | Status: AC
  Administered 2022-12-15: 40 mg via ORAL
  Filled 2022-12-15: qty 1

## 2022-12-15 MED ORDER — OXYCODONE HCL 5 MG PO TABS: 5.0000 mg | ORAL_TABLET | Freq: Once | ORAL | Status: AC | PRN

## 2022-12-15 MED ORDER — ONDANSETRON HCL 4 MG/2ML IJ SOLN: 4.0000 mg | Freq: Once | INTRAMUSCULAR | Status: DC | PRN

## 2022-12-15 MED ORDER — ONDANSETRON HCL 4 MG/2ML IJ SOLN
4.0000 mg | INTRAMUSCULAR | Status: DC | PRN
  Administered 2022-12-15 (×2): 4 mg via INTRAVENOUS
  Filled 2022-12-15 (×2): qty 2

## 2022-12-15 MED ORDER — OXYCODONE HCL 5 MG/5ML PO SOLN
5.0000 mg | Freq: Once | ORAL | Status: AC | PRN
  Administered 2022-12-15: 5 mg via ORAL

## 2022-12-15 MED ORDER — LIDOCAINE HCL (PF) 2 % IJ SOLN
INTRAMUSCULAR | Status: DC | PRN
  Administered 2022-12-15: 1.5 mg/kg/h via INTRADERMAL

## 2022-12-15 MED ORDER — BUPIVACAINE LIPOSOME 1.3 % IJ SUSP
INTRAMUSCULAR | Status: DC | PRN
  Administered 2022-12-15: 20 mL

## 2022-12-15 MED ORDER — ACETAMINOPHEN 500 MG PO TABS
1000.0000 mg | ORAL_TABLET | Freq: Three times a day (TID) | ORAL | Status: DC
  Administered 2022-12-15 – 2022-12-16 (×4): 1000 mg via ORAL
  Filled 2022-12-15 (×4): qty 2

## 2022-12-15 MED ORDER — 0.9 % SODIUM CHLORIDE (POUR BTL) OPTIME
TOPICAL | Status: DC | PRN
  Administered 2022-12-15: 1000 mL

## 2022-12-15 MED ORDER — PROPOFOL 10 MG/ML IV BOLUS
INTRAVENOUS | Status: DC | PRN
  Administered 2022-12-15: 180 mg via INTRAVENOUS

## 2022-12-15 MED ORDER — STERILE WATER FOR IRRIGATION IR SOLN
Status: DC | PRN
  Administered 2022-12-15: 1000 mL
  Administered 2022-12-15: 500 mL

## 2022-12-15 MED ORDER — OXYCODONE HCL 5 MG/5ML PO SOLN
ORAL | Status: AC
  Filled 2022-12-15: qty 5

## 2022-12-15 MED ORDER — BISACODYL 5 MG PO TBEC
5.0000 mg | DELAYED_RELEASE_TABLET | Freq: Every day | ORAL | Status: DC
  Administered 2022-12-15: 5 mg via ORAL
  Filled 2022-12-15: qty 1

## 2022-12-15 MED ORDER — ROCURONIUM BROMIDE 100 MG/10ML IV SOLN
INTRAVENOUS | Status: DC | PRN
  Administered 2022-12-15: 70 mg via INTRAVENOUS

## 2022-12-15 MED ORDER — BUPIVACAINE LIPOSOME 1.3 % IJ SUSP: 20.0000 mL | Freq: Once | INTRAMUSCULAR | Status: DC

## 2022-12-15 MED ORDER — PROPOFOL 10 MG/ML IV BOLUS
INTRAVENOUS | Status: AC
  Filled 2022-12-15: qty 20

## 2022-12-15 MED ORDER — HEPARIN SODIUM (PORCINE) 5000 UNIT/ML IJ SOLN
5000.0000 [IU] | INTRAMUSCULAR | Status: AC
  Administered 2022-12-15: 5000 [IU] via SUBCUTANEOUS
  Filled 2022-12-15: qty 1

## 2022-12-15 MED ORDER — LIP MEDEX EX OINT
TOPICAL_OINTMENT | CUTANEOUS | Status: DC | PRN
  Filled 2022-12-15 (×2): qty 7

## 2022-12-15 MED ORDER — LACTATED RINGERS IR SOLN
Status: DC | PRN
  Administered 2022-12-15: 1000 mL

## 2022-12-15 MED ORDER — SUMATRIPTAN SUCCINATE 50 MG PO TABS: 50.0000 mg | ORAL_TABLET | ORAL | Status: DC | PRN

## 2022-12-15 MED ORDER — KETOROLAC TROMETHAMINE 30 MG/ML IJ SOLN
30.0000 mg | Freq: Once | INTRAMUSCULAR | Status: AC | PRN
  Administered 2022-12-15: 30 mg via INTRAVENOUS

## 2022-12-15 MED ORDER — PROPOFOL 500 MG/50ML IV EMUL
INTRAVENOUS | Status: AC
  Filled 2022-12-15: qty 50

## 2022-12-15 MED ORDER — SIMETHICONE 80 MG PO CHEW: 80.0000 mg | CHEWABLE_TABLET | Freq: Four times a day (QID) | ORAL | Status: DC | PRN

## 2022-12-15 MED ORDER — BUPIVACAINE-EPINEPHRINE 0.5% -1:200000 IJ SOLN
INTRAMUSCULAR | Status: DC | PRN
  Administered 2022-12-15: 30 mL

## 2022-12-15 MED ORDER — AMISULPRIDE (ANTIEMETIC) 5 MG/2ML IV SOLN
10.0000 mg | Freq: Once | INTRAVENOUS | Status: AC
  Administered 2022-12-15: 10 mg via INTRAVENOUS

## 2022-12-15 MED ORDER — PANTOPRAZOLE SODIUM 40 MG IV SOLR
40.0000 mg | Freq: Every day | INTRAVENOUS | Status: DC
  Administered 2022-12-15: 40 mg via INTRAVENOUS
  Filled 2022-12-15: qty 10

## 2022-12-15 MED ORDER — LACTATED RINGERS IV SOLN: INTRAVENOUS | Status: DC

## 2022-12-15 MED ORDER — ENSURE MAX PROTEIN PO LIQD
2.0000 [oz_av] | ORAL | Status: DC
  Administered 2022-12-16 (×5): 2 [oz_av] via ORAL

## 2022-12-15 MED ORDER — LIDOCAINE HCL (CARDIAC) PF 100 MG/5ML IV SOSY
PREFILLED_SYRINGE | INTRAVENOUS | Status: DC | PRN
  Administered 2022-12-15: 100 mg via INTRAVENOUS

## 2022-12-15 MED ORDER — AMISULPRIDE (ANTIEMETIC) 5 MG/2ML IV SOLN
INTRAVENOUS | Status: AC
  Filled 2022-12-15: qty 4

## 2022-12-15 MED ORDER — BUPIVACAINE LIPOSOME 1.3 % IJ SUSP
INTRAMUSCULAR | Status: AC
  Filled 2022-12-15: qty 20

## 2022-12-15 MED ORDER — HYDROMORPHONE HCL 1 MG/ML IJ SOLN
0.2500 mg | INTRAMUSCULAR | Status: DC | PRN
  Administered 2022-12-15: 0.5 mg via INTRAVENOUS

## 2022-12-15 MED ORDER — KETAMINE HCL 10 MG/ML IJ SOLN
INTRAMUSCULAR | Status: AC
  Filled 2022-12-15: qty 1

## 2022-12-15 MED ORDER — AMISULPRIDE (ANTIEMETIC) 5 MG/2ML IV SOLN: 10.0000 mg | Freq: Once | INTRAVENOUS | Status: DC

## 2022-12-15 MED ORDER — SUGAMMADEX SODIUM 500 MG/5ML IV SOLN
INTRAVENOUS | Status: DC | PRN
  Administered 2022-12-15: 200 mg via INTRAVENOUS

## 2022-12-15 MED ORDER — ACETAMINOPHEN 500 MG PO TABS
1000.0000 mg | ORAL_TABLET | ORAL | Status: AC
  Administered 2022-12-15: 1000 mg via ORAL
  Filled 2022-12-15: qty 2

## 2022-12-15 MED ORDER — MIDAZOLAM HCL 5 MG/5ML IJ SOLN
INTRAMUSCULAR | Status: DC | PRN
  Administered 2022-12-15: 2 mg via INTRAVENOUS

## 2022-12-15 MED ORDER — OXYCODONE HCL 5 MG/5ML PO SOLN
5.0000 mg | Freq: Four times a day (QID) | ORAL | Status: DC | PRN
  Administered 2022-12-16: 5 mg via ORAL
  Filled 2022-12-15: qty 5

## 2022-12-15 MED ORDER — KETAMINE HCL 10 MG/ML IJ SOLN
INTRAMUSCULAR | Status: DC | PRN
  Administered 2022-12-15: 10 mg via INTRAVENOUS

## 2022-12-15 MED ORDER — KETOROLAC TROMETHAMINE 30 MG/ML IJ SOLN
INTRAMUSCULAR | Status: AC
  Filled 2022-12-15: qty 1

## 2022-12-15 MED ORDER — BUPIVACAINE-EPINEPHRINE (PF) 0.5% -1:200000 IJ SOLN
INTRAMUSCULAR | Status: AC
  Filled 2022-12-15: qty 30

## 2022-12-15 MED ORDER — SODIUM CHLORIDE 0.9 % IV SOLN
2.0000 g | INTRAVENOUS | Status: AC
  Administered 2022-12-15: 2 g via INTRAVENOUS
  Filled 2022-12-15: qty 2

## 2022-12-15 MED ORDER — FENTANYL CITRATE (PF) 100 MCG/2ML IJ SOLN
INTRAMUSCULAR | Status: DC | PRN
  Administered 2022-12-15 (×2): 100 ug via INTRAVENOUS
  Administered 2022-12-15: 50 ug via INTRAVENOUS

## 2022-12-15 MED ORDER — HEPARIN SODIUM (PORCINE) 5000 UNIT/ML IJ SOLN
5000.0000 [IU] | Freq: Three times a day (TID) | INTRAMUSCULAR | Status: DC
  Administered 2022-12-15 – 2022-12-16 (×3): 5000 [IU] via SUBCUTANEOUS
  Filled 2022-12-15 (×3): qty 1

## 2022-12-15 MED ORDER — LEVOTHYROXINE SODIUM 125 MCG PO TABS
125.0000 ug | ORAL_TABLET | Freq: Every morning | ORAL | Status: DC
  Administered 2022-12-16: 125 ug via ORAL
  Filled 2022-12-15: qty 1

## 2022-12-15 MED ORDER — LINACLOTIDE 145 MCG PO CAPS
290.0000 ug | ORAL_CAPSULE | Freq: Every day | ORAL | Status: DC
  Administered 2022-12-15: 290 ug via ORAL
  Filled 2022-12-15 (×2): qty 2

## 2022-12-15 MED ORDER — LIDOCAINE HCL (PF) 2 % IJ SOLN
INTRAMUSCULAR | Status: AC
  Filled 2022-12-15: qty 15

## 2022-12-15 MED ORDER — ROCURONIUM BROMIDE 10 MG/ML (PF) SYRINGE
PREFILLED_SYRINGE | INTRAVENOUS | Status: AC
  Filled 2022-12-15: qty 10

## 2022-12-15 MED ORDER — ACETAMINOPHEN 160 MG/5ML PO SOLN: 1000.0000 mg | Freq: Three times a day (TID) | ORAL | Status: DC

## 2022-12-15 MED ORDER — CHLORHEXIDINE GLUCONATE 0.12 % MT SOLN
15.0000 mL | Freq: Once | OROMUCOSAL | Status: AC
  Administered 2022-12-15: 15 mL via OROMUCOSAL

## 2022-12-15 MED ORDER — MORPHINE SULFATE (PF) 2 MG/ML IV SOLN: 1.0000 mg | INTRAVENOUS | Status: DC | PRN

## 2022-12-15 SURGICAL SUPPLY — 75 items
ANTIFOG SOL W/FOAM PAD STRL (MISCELLANEOUS) ×1
APPLIER CLIP 5 13 M/L LIGAMAX5 (MISCELLANEOUS)
APPLIER CLIP ROT 10 11.4 M/L (STAPLE)
BAG COUNTER SPONGE SURGICOUNT (BAG) ×1 IMPLANT
BLADE SURG SZ11 CARB STEEL (BLADE) ×1 IMPLANT
CANNULA REDUC XI 12-8 STAPL (CANNULA) ×1
CANNULA REDUCER 12-8 DVNC XI (CANNULA) ×1 IMPLANT
CHLORAPREP W/TINT 26 (MISCELLANEOUS) ×2 IMPLANT
CLIP APPLIE 5 13 M/L LIGAMAX5 (MISCELLANEOUS) IMPLANT
CLIP APPLIE ROT 10 11.4 M/L (STAPLE) IMPLANT
COVER SURGICAL LIGHT HANDLE (MISCELLANEOUS) ×1 IMPLANT
COVER TIP SHEARS 8 DVNC (MISCELLANEOUS) IMPLANT
COVER TIP SHEARS 8MM DA VINCI (MISCELLANEOUS)
DERMABOND ADVANCED .7 DNX12 (GAUZE/BANDAGES/DRESSINGS) ×1 IMPLANT
DRAPE ARM DVNC X/XI (DISPOSABLE) ×4 IMPLANT
DRAPE COLUMN DVNC XI (DISPOSABLE) ×1 IMPLANT
DRAPE DA VINCI XI ARM (DISPOSABLE) ×4
DRAPE DA VINCI XI COLUMN (DISPOSABLE) ×1
ELECT REM PT RETURN 15FT ADLT (MISCELLANEOUS) ×1 IMPLANT
GAUZE 4X4 16PLY ~~LOC~~+RFID DBL (SPONGE) ×1 IMPLANT
GLOVE BIO SURGEON STRL SZ7.5 (GLOVE) ×2 IMPLANT
GLOVE INDICATOR 8.0 STRL GRN (GLOVE) ×2 IMPLANT
GOWN STRL REUS W/ TWL XL LVL3 (GOWN DISPOSABLE) ×2 IMPLANT
GOWN STRL REUS W/TWL XL LVL3 (GOWN DISPOSABLE) ×2
GRASPER SUT TROCAR 14GX15 (MISCELLANEOUS) ×1 IMPLANT
HEMOSTAT SNOW SURGICEL 2X4 (HEMOSTASIS) IMPLANT
IRRIG SUCT STRYKERFLOW 2 WTIP (MISCELLANEOUS) ×1
IRRIGATION SUCT STRKRFLW 2 WTP (MISCELLANEOUS) ×1 IMPLANT
IRRIGATOR SUCT 8 DISP DVNC XI (IRRIGATION / IRRIGATOR) IMPLANT
IRRIGATOR SUCTION 8MM XI DISP (IRRIGATION / IRRIGATOR)
KIT BASIN OR (CUSTOM PROCEDURE TRAY) ×1 IMPLANT
KIT TURNOVER KIT A (KITS) IMPLANT
LUBRICANT JELLY K Y 4OZ (MISCELLANEOUS) IMPLANT
MARKER SKIN DUAL TIP RULER LAB (MISCELLANEOUS) IMPLANT
MAT PREVALON FULL STRYKER (MISCELLANEOUS) ×1 IMPLANT
NDL SPNL 18GX3.5 QUINCKE PK (NEEDLE) ×1 IMPLANT
NEEDLE SPNL 18GX3.5 QUINCKE PK (NEEDLE) ×1 IMPLANT
OBTURATOR OPTICAL STANDARD 8MM (TROCAR) ×1
OBTURATOR OPTICAL STND 8 DVNC (TROCAR) ×1
OBTURATOR OPTICALSTD 8 DVNC (TROCAR) ×1 IMPLANT
PACK CARDIOVASCULAR III (CUSTOM PROCEDURE TRAY) ×1 IMPLANT
RELOAD STAPLE 60 2.5 WHT DVNC (STAPLE) IMPLANT
RELOAD STAPLE 60 3.5 BLU DVNC (STAPLE) IMPLANT
RELOAD STAPLER 2.5X60 WHT DVNC (STAPLE) ×4 IMPLANT
RELOAD STAPLER 3.5X60 BLU DVNC (STAPLE) ×1 IMPLANT
SCISSORS LAP 5X35 DISP (ENDOMECHANICALS) IMPLANT
SEAL CANN UNIV 5-8 DVNC XI (MISCELLANEOUS) ×3 IMPLANT
SEAL XI 5MM-8MM UNIVERSAL (MISCELLANEOUS) ×3
SEALER VESSEL DA VINCI XI (MISCELLANEOUS) ×1
SEALER VESSEL EXT DVNC XI (MISCELLANEOUS) ×1 IMPLANT
SET TUBE SMOKE EVAC HIGH FLOW (TUBING) ×1 IMPLANT
SLEEVE GASTRECTOMY 40FR VISIGI (MISCELLANEOUS) IMPLANT
SOLUTION ANTFG W/FOAM PAD STRL (MISCELLANEOUS) ×1 IMPLANT
SOLUTION ELECTROLUBE (MISCELLANEOUS) ×1 IMPLANT
SPIKE FLUID TRANSFER (MISCELLANEOUS) ×1 IMPLANT
STAPLER 60 DA VINCI SURE FORM (STAPLE) ×1
STAPLER 60 SUREFORM DVNC (STAPLE) ×1 IMPLANT
STAPLER CANNULA SEAL DVNC XI (STAPLE) ×1 IMPLANT
STAPLER CANNULA SEAL XI (STAPLE) ×1
STAPLER RELOAD 2.5X60 WHITE (STAPLE) ×4
STAPLER RELOAD 2.5X60 WHT DVNC (STAPLE) ×4
STAPLER RELOAD 3.5X60 BLU DVNC (STAPLE) ×1
STAPLER RELOAD 3.5X60 BLUE (STAPLE) ×1
SUT MNCRL AB 4-0 PS2 18 (SUTURE) ×2 IMPLANT
SUT VIC AB 0 CT1 27 (SUTURE) ×1
SUT VIC AB 0 CT1 27XBRD ANTBC (SUTURE) ×1 IMPLANT
SUT VIC AB 2-0 SH 27 (SUTURE) ×1
SUT VIC AB 2-0 SH 27XBRD (SUTURE) ×1 IMPLANT
SUT VICRYL 0 TIES 12 18 (SUTURE) IMPLANT
SYR 20ML LL LF (SYRINGE) ×1 IMPLANT
TOWEL OR 17X26 10 PK STRL BLUE (TOWEL DISPOSABLE) ×1 IMPLANT
TRAY FOLEY MTR SLVR 16FR STAT (SET/KITS/TRAYS/PACK) IMPLANT
TROCAR ADV FIXATION 12X100MM (TROCAR) IMPLANT
TROCAR Z-THREAD FIOS 5X100MM (TROCAR) ×1 IMPLANT
TUBE CALIBRATION LAPBAND (TUBING) IMPLANT

## 2022-12-15 NOTE — Op Note (Signed)
Patient: Susan Shields (09-02-1981, [id number])  Date of Surgery: 12/15/2022   Preoperative Diagnosis: Morbid obesity   Postoperative Diagnosis: Morbid obesity   Surgical Procedure: XI ROBOTIC SLEEVE GASTRECTOMY: 43775 (CPT) UPPER GI ENDOSCOPY: [id number]   Operative Team Members:  Surgeon(s) and Role:    * Arica Bevilacqua, Hyman Hopes, MD - Primary    * Maczis, Hedda Slade, [place] - Assisting   Anesthesiologist: Trevor Iha, MD CRNA: Shanon Payor, CRNA; Jhonnie Garner, CRNA   Anesthesia: General   Fluids:  Total I/O In: 600 [I.V.:500; IV Piggyback:100] Out: -   Complications: None  Drains:  none   Specimen:  ID Type Source Tests Collected by Time Destination  1 : Stomach Tissue PATH GI benign resection SURGICAL PATHOLOGY Zendaya Groseclose, Hyman Hopes, MD 12/15/2022 [phone]      Disposition:  PACU - hemodynamically stable.  Plan of Care: Admit to inpatient     Indications for Procedure:  Susan Shields is a 41 y.o. female who is seen for bariatric surgery consultation. The patient has morbid obesity with a BMI of Body mass index is 37 kg/m. and the following conditions related to obesity: hyperlipidemia, prediabetes (A1C 6.0)  Today we discussed the surgical options to treat obesity and its associated comorbidity. After discussing the available procedures in the region, we discussed in great detail the surgeries I offer: robotic sleeve gastrectomy and robotic roux-en-y gastric bypass. We discussed the procedures themselves as well as their risks, benefits and alternatives. I entered the patient's basic information into the [facility] Metabolic Surgery Risk/Benefit Calculator to facilitate this discussion.   After a full discussion and all questions answered, the patient is interested in pursuing a robotic sleeve gastrectomy with upper endoscopy.  Her preoperative pathway includes: - Bloodwork - Dietician consult - Chest x-ray - EKG - Psychology evaluation - Upper endoscopy  with biopsy. (normal)     Today she presents for surgery.  We again discussed the risks, benefits and alterantives and the patient granted consent to proceed.  We will proceed as scheduled    Findings: Normal anatomy, omental adhesions in pelvis lysed  Infection status: Patient: Private Patient Elective Case Case: Elective Infection Present At Time Of Surgery (PATOS): None   Description of Procedure:   On the date stated above, the patient was taken to the operating room suite and placed in supine positioning.  General endotracheal anesthesia was induced.  A timeout was completed verifying the correct patient, procedure, positioning and equipment needed for the case.  The patient's abdomen was prepped and draped in the usual sterile fashion.  I entered the patient's right upper quadrant using a 5 mm trocar in the optical technique.  There was no trauma to underlying viscera with initial trocar placement.  The abdomen was insufflated 15 mmHg.  A total of 4 robotic trochars were placed across the mid abdomen, including the 5 mm initial trocar being upsized to a 8 mm trocar.  The robotic stapler trocar was placed in the number two position.  The [facility] liver retractor was placed through the subxiphoid region and under the left lobe of the liver and was connected to the rail of the bed.  A TAP block was placed using marcaine and Exparel under direct vision of the laparoscope.  The [employer] XI robotic platform was docked and we transitioned to robotic surgery.   Using the tip up fenestrated grasper, fenestrated bipolar, 30 degree camera and Vessel Sealer from the patient's right to left, we began  by dissecting the angle of His off the left crus of the diaphragm.  The adhesions between the stomach, spleen and diaphragm were divided using the Vessel Sealer to define the angle of His.    I then started 6 cm away from the pylorus along the greater curve the stomach and divided the gastroepiploic  vessels and the gastrocolic ligament.  The lesser sac was entered.  There were really no adhesions to the posterior wall of the stomach.  The greater curve was mobilized working superiorly toward the spleen.  All of the gastroepiploic and short gastric vessels were divided as we divided the gastrocolic and gastrosplenic ligaments.  As we reached the splenic hilum, I lifted the stomach anteriorly.  I created a tunnel between the stomach and its attachments to the retroperitoneum posteriorly just to the left of the GE junction until I encountered the left crus and my previous angle of His dissection.  We then were able to approach the shortest of the short gastrics both from the greater curve the stomach laterally and from the left crus medially.  These were divided using the Vessel Sealer and the fundus of the stomach was fully mobilized.  With the stomach fully mobilized we direct our attention to stapling.  A 40 French VISI G was inserted into the stomach and positioned along the lesser curve the stomach and suction was applied.  The 60 mm robotic sureform linear stapler was used to create the sleeve gastrectomy.  We started 6 cm from the pylorus and were careful to avoid narrowing at the incisura.  We stayed about 1 cm away from the GE junction to protect the sling fibers.  We used one blue and multiple white loads of the linear stapler.  With the sleeve gastrectomy completed the VISI G was taken off suction and removed and we performed an upper endoscopy.  The intra-abdominal pressure was decreased to to check hemostasis.  The foregut was submerged in saline irrigation and the adult upper endoscope was inserted into the stomach as far as the pylorus to inspect the sleeve.  The sleeve appeared appropriately oriented without any twisting.  There was good hemostasis.  The sleeve was widely patent at the incisura with no narrowing.  There was no significant retained fundus.  The sleeve was inflated with the  endoscope and there was no bubbling of the irrigation, suggesting a negative leak test and a airtight sleeve gastrectomy.  The foregut was decompressed with the endoscope and the endoscope was removed. An omentopexy was performed, using running 2-0 vicryl suture to connect the inferior two staple lines to the divided omentum. There was good hemostasis at the end of the case.  The robot was undocked and moved away from the field.  The sleeve gastrectomy specimen was removed from the stapler port.  There were omental adhesions to the pelvis that were divided bluntly.  The fascia of the stapler port was closed using a 0 Vicryl on a PMI suture passer.   The liver retractor was removed under direct vision.  The pneumoperitoneum was evacuated.  The skin was closed using 4-0 Monocryl and Dermabond.  All sponge and needle counts were correct at the end of the case.    Ivar Drape, MD General, Bariatric, & Minimally Invasive Surgery [facility], [place]

## 2022-12-15 NOTE — Progress Notes (Signed)
Discussed QI "Goals for Discharge" document with patient including ambulation in halls, Incentive Spirometry use every hour, and oral care.  Also discussed pain and nausea control.  BSTOP education provided including BSTOP information guide, "Guide for Pain Management after your Bariatric Procedure".  Diet progression education provided including "Bariatric Surgery Post-Op Food Plan Phase 1: Liquids".  Questions answered.  Will continue to partner with bedside RN and follow up with patient per protocol.  

## 2022-12-15 NOTE — Anesthesia Procedure Notes (Signed)
Procedure Name: Intubation Date/Time: 12/15/2022 7:41 AM  Performed by: Jonna Munro, CRNAPre-anesthesia Checklist: Patient identified, Emergency Drugs available, Suction available, Patient being monitored and Timeout performed Patient Re-evaluated:Patient Re-evaluated prior to induction Oxygen Delivery Method: Circle system utilized Preoxygenation: Pre-oxygenation with 100% oxygen Induction Type: IV induction Ventilation: Mask ventilation without difficulty Laryngoscope Size: Mac and 3 Grade View: Grade II Tube type: Oral Tube size: 7.0 mm Number of attempts: 1 Airway Equipment and Method: Stylet Placement Confirmation: ETT inserted through vocal cords under direct vision, positive ETCO2, CO2 detector and breath sounds checked- equal and bilateral Secured at: 21 cm Tube secured with: Tape Dental Injury: Teeth and Oropharynx as per pre-operative assessment

## 2022-12-15 NOTE — Discharge Instructions (Signed)

## 2022-12-15 NOTE — H&P (Signed)
Admitting Physician: Hyman Hopes Rowland Ericsson  Service: Bariatric surgery  CC: obesity  Subjective   HPI: Susan Shields is an 41 y.o. female who is here for robotic sleeve gastrectomy  Past Medical History:  Diagnosis Date   Arthritis    Constipation    Graves disease    Hyperlipidemia    Hypothyroidism    IBS (irritable bowel syndrome)    Migraine    Pre-diabetes    Thyroid disease    Vitamin D deficiency     Past Surgical History:  Procedure Laterality Date   BIOPSY  10/24/2022   Procedure: BIOPSY;  Surgeon: Quentin Ore, MD;  Location: [facility];  Service: General;;   CESAREAN SECTION     COLONOSCOPY     DILATION AND EVACUATION     ESOPHAGOGASTRODUODENOSCOPY N/A 10/24/2022   Procedure: ESOPHAGOGASTRODUODENOSCOPY (EGD);  Surgeon: Quentin Ore, MD;  Location: [facility];  Service: General;  Laterality: N/A;    Family History  Problem Relation Age of Onset   Diabetes Mother    Cancer Mother        MMT Cancer   Obesity Mother    Throat cancer Father    Sudden death Father    Stroke Father    Migraines Sister    Healthy Sister    Healthy Sister    Healthy Brother    Healthy Brother    Healthy Brother    Healthy Son     Social:  reports that she has never smoked. She has never used smokeless tobacco. She reports current alcohol use. She reports that she does not use drugs.  Allergies:  Allergies  Allergen Reactions   Simvastatin Other (See Comments)    fatigue   Tetanus-Diphth-Acell Pertussis Swelling    Medications: Current Outpatient Medications  Medication Instructions   bisacodyl (BISACODYL) 5 mg, Oral, Daily at bedtime   levothyroxine (SYNTHROID) 125 MCG tablet Take 1 tablet (125 mcg total) by mouth every morning on an empty stomach   Linzess 290 mcg, Oral, Daily before breakfast   SUMAtriptan (IMITREX) 50 mg, Oral, Every 2 hours PRN, May repeat in 2 hours if headache persists or recurs.   topiramate (TOPAMAX) 100 mg, Oral,  Daily at bedtime   Vitamin D, Ergocalciferol, (DRISDOL) 1.25 MG (50000 UNIT) CAPS capsule Take 1 capsule by mouth once a week.    ROS - all of the below systems have been reviewed with the patient and positives are indicated with bold text General: chills, fever or night sweats Eyes: blurry vision or double vision ENT: epistaxis or sore throat Allergy/Immunology: itchy/watery eyes or nasal congestion Hematologic/Lymphatic: bleeding problems, blood clots or swollen lymph nodes Endocrine: temperature intolerance or unexpected weight changes Breast: new or changing breast lumps or nipple discharge Resp: cough, shortness of breath, or wheezing CV: chest pain or dyspnea on exertion GI: as per HPI GU: dysuria, trouble voiding, or hematuria MSK: joint pain or joint stiffness Neuro: TIA or stroke symptoms Derm: pruritus and skin lesion changes Psych: anxiety and depression  Objective   PE Blood pressure (!) 145/91, pulse 86, temperature 97.9 F (36.6 C), temperature source Oral, resp. rate 16, weight 97.5 kg, last menstrual period 11/07/2022, SpO2 99 %. Constitutional: NAD; conversant; no deformities Eyes: Moist conjunctiva; no lid lag; anicteric; PERRL Neck: Trachea midline; no thyromegaly Lungs: Normal respiratory effort; no tactile fremitus CV: RRR; no palpable thrills; no pitting edema GI: Abd soft, nontender; no palpable hepatosplenomegaly MSK: Normal range of motion of extremities; no clubbing/cyanosis  Psychiatric: Appropriate affect; alert and oriented x3 Lymphatic: No palpable cervical or axillary lymphadenopathy  Results for orders placed or performed during the hospital encounter of 12/15/22 (from the past 24 hour(s))  Pregnancy, urine POC     Status: None   Collection Time: 12/15/22  5:38 AM  Result Value Ref Range   Preg Test, Ur NEGATIVE NEGATIVE    Imaging Orders  No imaging studies ordered today     Assessment and Plan   Susan Shields is a 41 y.o. female who  is seen for bariatric surgery consultation. The patient has morbid obesity with a BMI of Body mass index is 37 kg/m. and the following conditions related to obesity: hyperlipidemia, prediabetes (A1C 6.0)  Today we discussed the surgical options to treat obesity and its associated comorbidity. After discussing the available procedures in the region, we discussed in great detail the surgeries I offer: robotic sleeve gastrectomy and robotic roux-en-y gastric bypass. We discussed the procedures themselves as well as their risks, benefits and alternatives. I entered the patient's basic information into the [facility] Metabolic Surgery Risk/Benefit Calculator to facilitate this discussion.   After a full discussion and all questions answered, the patient is interested in pursuing a robotic sleeve gastrectomy with upper endoscopy.  Her preoperative pathway includes: - Bloodwork - Dietician consult - Chest x-ray - EKG - Psychology evaluation - Upper endoscopy with biopsy. (normal)     Today she presents for surgery.  We again discussed the risks, benefits and alterantives and the patient granted consent to proceed.  We will proceed as scheduled     Quentin Ore, MD

## 2022-12-15 NOTE — Progress Notes (Addendum)
PHARMACY CONSULT FOR:  Risk Assessment for Post-Discharge VTE Following Bariatric Surgery  Post-Discharge VTE Risk Assessment: This patient's probability of 30-day post-discharge VTE is increased due to the factors marked: x Sleeve gastrectomy   Liver disorder (transplant, cirrhosis, or nonalcoholic steatohepatitis)   Hx of VTE   Hemorrhage requiring transfusion   GI perforation, leak, or obstruction   ====================================================    Female    Age >/=60 years    BMI >/=50 kg/m2    CHF    Dyspnea at Rest    Paraplegia   x Non-gastric-band surgery    Operation Time >/=3 hr    Return to OR     Length of Stay >/= 3 d   Hypercoagulable condition   Significant venous stasis      Predicted probability of 30-day post-discharge VTE: - 0.16%  Other patient-specific factors to consider: - No noted history of PE/DVT in chart    Recommendation for Discharge: No pharmacologic prophylaxis post-discharge    Susan Shields is a 41 y.o. female who underwent  robotic sleeve gastrectomy  on  12/15/2022   Case start: 0758 Case end: 0906   Allergies  Allergen Reactions   Simvastatin Other (See Comments)    fatigue   Tetanus-Diphth-Acell Pertussis Swelling    Patient Measurements: Weight: 97.5 kg (215 lb) Body mass index is 36.9 kg/m.  Recent Labs    12/15/22 1210 12/15/22 1211  WBC 11.0*  --   HGB 13.6 13.2  HCT 41.6 41.0  PLT 251  --   CREATININE 1.33*  --    Estimated Creatinine Clearance: 62.5 mL/min (A) (by C-G formula based on SCr of 1.33 mg/dL (H)).    Past Medical History:  Diagnosis Date   Arthritis    Constipation    Graves disease    Hyperlipidemia    Hypothyroidism    IBS (irritable bowel syndrome)    Migraine    Pre-diabetes    Thyroid disease    Vitamin D deficiency      Medications Prior to Admission  Medication Sig Dispense Refill Last Dose   bisacodyl 5 MG EC tablet Take 5 mg by mouth at bedtime.   12/14/2022    levothyroxine (SYNTHROID) 125 MCG tablet Take 1 tablet (125 mcg total) by mouth every morning on an empty stomach 90 tablet 3 12/15/2022 at 0415   linaclotide (LINZESS) 290 MCG CAPS capsule Take 1 capsule (290 mcg total) by mouth daily before breakfast. (Patient taking differently: Take 290 mcg by mouth at bedtime.) 90 capsule 1 12/14/2022   topiramate (TOPAMAX) 100 MG tablet Take 1 tablet (100 mg total) by mouth at bedtime. 30 tablet 5 12/14/2022   Vitamin D, Ergocalciferol, (DRISDOL) 1.25 MG (50000 UNIT) CAPS capsule Take 1 capsule by mouth once a week. 13 capsule 1 12/08/2022   SUMAtriptan (IMITREX) 50 MG tablet Take 1 tablet (50 mg total) by mouth every 2 (two) hours as needed for migraine. May repeat in 2 hours if headache persists or recurs. 10 tablet 6 More than a month      Adalberto Cole, PharmD, BCPS 12/15/2022 1:17 PM

## 2022-12-15 NOTE — Transfer of Care (Signed)
Immediate Anesthesia Transfer of Care Note  Patient: Susan Shields  Procedure(s) Performed: XI ROBOTIC SLEEVE GASTRECTOMY UPPER GI ENDOSCOPY  Patient Location: PACU  Anesthesia Type:General  Level of Consciousness: awake, alert , oriented, and patient cooperative  Airway & Oxygen Therapy: Patient Spontanous Breathing and Patient connected to face mask oxygen  Post-op Assessment: Report given to RN, Post -op Vital signs reviewed and stable, and Patient moving all extremities X 4  Post vital signs: Reviewed and stable  Last Vitals:  Vitals Value Taken Time  BP 150/89 12/15/22 0919  Temp    Pulse 105 12/15/22 0921  Resp 15 12/15/22 0921  SpO2 100 % 12/15/22 0921  Vitals shown include unvalidated device data.  Last Pain:  Vitals:   12/15/22 0622  TempSrc:   PainSc: 0-No pain         Complications: No notable events documented.

## 2022-12-15 NOTE — Anesthesia Postprocedure Evaluation (Signed)
Anesthesia Post Note  Patient: Susan Shields  Procedure(s) Performed: XI ROBOTIC SLEEVE GASTRECTOMY UPPER GI ENDOSCOPY     Patient location during evaluation: PACU Anesthesia Type: General Level of consciousness: awake and alert Pain management: pain level controlled Vital Signs Assessment: post-procedure vital signs reviewed and stable Respiratory status: spontaneous breathing, nonlabored ventilation, respiratory function stable and patient connected to nasal cannula oxygen Cardiovascular status: blood pressure returned to baseline and stable Postop Assessment: no apparent nausea or vomiting Anesthetic complications: no  No notable events documented.  Last Vitals:  Vitals:   12/15/22 1045 12/15/22 1111  BP: 135/84 130/77  Pulse: 88 88  Resp:  16  Temp:  36.8 C  SpO2: 100% 98%    Last Pain:  Vitals:   12/15/22 1111  TempSrc: Oral  PainSc:                  Trevor Iha

## 2022-12-16 ENCOUNTER — Other Ambulatory Visit (HOSPITAL_COMMUNITY): Payer: Self-pay

## 2022-12-16 ENCOUNTER — Encounter (HOSPITAL_COMMUNITY): Payer: Self-pay | Admitting: Surgery

## 2022-12-16 LAB — CBC WITH DIFFERENTIAL/PLATELET
Abs Immature Granulocytes: 0.02 10*3/uL (ref 0.00–0.07)
Basophils Absolute: 0 10*3/uL (ref 0.0–0.1)
Basophils Relative: 0 %
Eosinophils Absolute: 0 10*3/uL (ref 0.0–0.5)
Eosinophils Relative: 0 %
HCT: 37.2 % (ref 36.0–46.0)
Hemoglobin: 12.2 g/dL (ref 12.0–15.0)
Immature Granulocytes: 0 %
Lymphocytes Relative: 29 %
Lymphs Abs: 2.7 10*3/uL (ref 0.7–4.0)
MCH: 26.6 pg (ref 26.0–34.0)
MCHC: 32.8 g/dL (ref 30.0–36.0)
MCV: 81.2 fL (ref 80.0–100.0)
Monocytes Absolute: 0.5 10*3/uL (ref 0.1–1.0)
Monocytes Relative: 5 %
Neutro Abs: 6.2 10*3/uL (ref 1.7–7.7)
Neutrophils Relative %: 66 %
Platelets: 244 10*3/uL (ref 150–400)
RBC: 4.58 MIL/uL (ref 3.87–5.11)
RDW: 16 % — ABNORMAL HIGH (ref 11.5–15.5)
WBC: 9.4 10*3/uL (ref 4.0–10.5)
nRBC: 0 % (ref 0.0–0.2)

## 2022-12-16 LAB — BASIC METABOLIC PANEL
Anion gap: 7 (ref 5–15)
BUN: 13 mg/dL (ref 6–20)
CO2: 22 mmol/L (ref 22–32)
Calcium: 8.7 mg/dL — ABNORMAL LOW (ref 8.9–10.3)
Chloride: 112 mmol/L — ABNORMAL HIGH (ref 98–111)
Creatinine, Ser: 1.2 mg/dL — ABNORMAL HIGH (ref 0.44–1.00)
GFR, Estimated: 58 mL/min — ABNORMAL LOW (ref >60)
Glucose, Bld: 97 mg/dL (ref 70–99)
Potassium: 3.1 mmol/L — ABNORMAL LOW (ref 3.5–5.1)
Sodium: 141 mmol/L (ref 135–145)

## 2022-12-16 LAB — SURGICAL PATHOLOGY

## 2022-12-16 MED ORDER — OXYCODONE HCL 5 MG PO TABS
5.0000 mg | ORAL_TABLET | Freq: Four times a day (QID) | ORAL | 0 refills | Status: DC | PRN
  Filled 2022-12-16: qty 10, 3d supply, fill #0

## 2022-12-16 MED ORDER — PANTOPRAZOLE SODIUM 40 MG PO TBEC
40.0000 mg | DELAYED_RELEASE_TABLET | Freq: Every day | ORAL | 0 refills | Status: DC
  Filled 2022-12-16: qty 90, 90d supply, fill #0

## 2022-12-16 MED ORDER — GABAPENTIN 100 MG PO CAPS
200.0000 mg | ORAL_CAPSULE | Freq: Two times a day (BID) | ORAL | 0 refills | Status: DC
  Filled 2022-12-16: qty 20, 5d supply, fill #0

## 2022-12-16 MED ORDER — ONDANSETRON 4 MG PO TBDP
4.0000 mg | ORAL_TABLET | Freq: Four times a day (QID) | ORAL | 0 refills | Status: DC | PRN
  Filled 2022-12-16: qty 20, 5d supply, fill #0

## 2022-12-16 MED ORDER — ACETAMINOPHEN 500 MG PO TABS: 1000.0000 mg | ORAL_TABLET | Freq: Three times a day (TID) | ORAL | 0 refills | Status: AC

## 2022-12-16 NOTE — Progress Notes (Signed)
Discharge instructions given to patient and all questions were answered.  

## 2022-12-16 NOTE — TOC CM/SW Note (Signed)
Transition of Care (TOC) Screening Note  Patient Details  Name: Susan Shields Date of Birth: 10-25-1981  Transition of Care [facility] CM/SW Contact:    Ewing Schlein, LCSW Phone Number: 12/16/2022, 10:16 AM  Transition of Care Department [facility]) has reviewed patient and no TOC needs have been identified at this time. We will continue to monitor patient advancement through interdisciplinary progression rounds. If new patient transition needs arise, please place a TOC consult.

## 2022-12-16 NOTE — Progress Notes (Signed)
Patient alert and oriented, pain is controlled. Patient is tolerating fluids, advanced to protein shake today, patient is tolerating well.  Reviewed Gastric sleeve discharge instructions with patient and patient is able to articulate understanding.  Provided information on BELT program, Support Group and [facility] outpatient pharmacy. All questions answered, will continue to monitor.  

## 2022-12-16 NOTE — Discharge Summary (Signed)
Patient ID: Susan Shields [id number] 41 y.o. August 27, 1981  12/15/2022  Discharge date and time: 12/16/2022  Admitting Physician: Hyman Hopes Robie Mcniel  Discharge Physician: Hyman Hopes Avaiah Stempel  Admission Diagnoses: Morbid obesity (HCC) [E66.01] Patient Active Problem List   Diagnosis Date Noted   Morbid obesity (HCC) 12/15/2022   Chronic idiopathic constipation 02/18/2022   Prediabetes 11/26/2019   Vitamin D deficiency 11/26/2019   Insulin resistance 12/07/2018   Other hyperlipidemia 11/01/2018   Other specified hypothyroidism 11/01/2018   Class 1 obesity with serious comorbidity and body mass index (BMI) of 34.0 to 34.9 in adult 11/01/2018   Slow transit constipation 10/11/2014     Discharge Diagnoses:  Patient Active Problem List   Diagnosis Date Noted   Morbid obesity (HCC) 12/15/2022   Chronic idiopathic constipation 02/18/2022   Prediabetes 11/26/2019   Vitamin D deficiency 11/26/2019   Insulin resistance 12/07/2018   Other hyperlipidemia 11/01/2018   Other specified hypothyroidism 11/01/2018   Class 1 obesity with serious comorbidity and body mass index (BMI) of 34.0 to 34.9 in adult 11/01/2018   Slow transit constipation 10/11/2014    Operations: Procedure(s): XI ROBOTIC SLEEVE GASTRECTOMY UPPER GI ENDOSCOPY  Admission Condition: good  Discharged Condition: good  Indication for Admission: Obesity  Hospital Course: Robotic sleeve gastrectomy  Consults: None  Significant Diagnostic Studies: None  Treatments: surgery: as above  Disposition: Home  Patient Instructions:  Allergies as of 12/16/2022       Reactions   Simvastatin Other (See Comments)   fatigue   Tetanus-diphth-acell Pertussis Swelling        Medication List     TAKE these medications    acetaminophen 500 MG tablet Commonly known as: TYLENOL Take 2 tablets (1,000 mg total) by mouth every 8 (eight) hours for 5 days.   bisacodyl 5 MG EC tablet Generic drug: bisacodyl Take 5  mg by mouth at bedtime.   gabapentin 100 MG capsule Commonly known as: NEURONTIN Take 2 capsules (200 mg total) by mouth every 12 (twelve) hours.   levothyroxine 125 MCG tablet Commonly known as: SYNTHROID Take 1 tablet (125 mcg total) by mouth every morning on an empty stomach   Linzess 290 MCG Caps capsule Generic drug: linaclotide Take 1 capsule (290 mcg total) by mouth daily before breakfast. What changed: when to take this   ondansetron 4 MG disintegrating tablet Commonly known as: ZOFRAN-ODT Take 1 tablet (4 mg total) by mouth every 6 (six) hours as needed for nausea or vomiting.   oxyCODONE 5 MG immediate release tablet Commonly known as: Oxy IR/ROXICODONE Take 1 tablet (5 mg total) by mouth every 6 (six) hours as needed for severe pain.   pantoprazole 40 MG tablet Commonly known as: PROTONIX Take 1 tablet (40 mg total) by mouth daily.   SUMAtriptan 50 MG tablet Commonly known as: Imitrex Take 1 tablet (50 mg total) by mouth every 2 (two) hours as needed for migraine. May repeat in 2 hours if headache persists or recurs.   topiramate 100 MG tablet Commonly known as: TOPAMAX Take 1 tablet (100 mg total) by mouth at bedtime.   Vitamin D (Ergocalciferol) 1.25 MG (50000 UNIT) Caps capsule Commonly known as: DRISDOL Take 1 capsule by mouth once a week.        Activity: no heavy lifting for 4 weeks Diet:  Bariatric diet protocol Wound Care: keep wound clean and dry  Follow-up:  With Dr. Dossie Der.  Signed: Hyman Hopes Maryum Batterson [facility], Bariatric, [facility] Minimally Invasive Surgery [facility]   12/16/2022, 9:56 AM

## 2022-12-19 ENCOUNTER — Telehealth (HOSPITAL_COMMUNITY): Payer: Self-pay | Admitting: *Deleted

## 2022-12-19 NOTE — Telephone Encounter (Signed)
1.  Tell me about your pain and pain management? Pt denies any pain.  2.  Let's talk about fluid intake.  How much total fluid are you taking in? Pt states that she is getting in at least 50-55oz of fluid including protein shakes and bottled water.  Pt instructed to assess status and suggestions daily utilizing Hydration Action Plan on discharge folder and to call [facility] if in the "red zone".   3.  How much protein have you taken in the last 2 days? Pt states she is meeting her goal of 60g of protein each day with the protein shakes.  4.  Have you had nausea?  Tell me about when have experienced nausea and what you did to help? Pt denies nausea.   5.  Has the frequency or color changed with your urine? Pt states that she is urinating "fine" with no changes in frequency or urgency.     6.  Tell me what your incisions look like? "Incisions look fine". Pt denies a fever, chills.  Pt states incisions are not swollen, open, or draining.  Pt encouraged to call [facility] if incisions change.   7.  Have you been passing gas? BM? Pt states that she is having BMs. Last BM 12/19/22.     8.  If a problem or question were to arise who would you call?  Do you know contact numbers for [facility], [facility], and [facility]? Pt denies dehydration symptoms.  Pt can describe s/sx of dehydration.  Pt knows to call [facility] for surgical, [facility] for nutrition, and [facility] for non-urgent questions or concerns.   9.  How has the walking going? Pt states she is walking around and able to be active without difficulty.   10. Are you still using your incentive spirometer?  If so, how often? Pt states that she is doing the I.S.   Pt encouraged to use incentive spirometer, at least 10x every hour while awake until she sees the surgeon.  11.  How are your vitamins and calcium going?  How are you taking them? Pt states that she will begin the supplements tomorrow.  Reinforced education about taking supplements at least two hours apart.  Reminded patient  that the first 30 days post-operatively are important for successful recovery.  Practice good hand hygiene, wearing a mask when appropriate (since optional in most places), and minimizing exposure to people who live outside of the home, especially if they are exhibiting any respiratory, GI, or illness-like symptoms.

## 2022-12-23 ENCOUNTER — Other Ambulatory Visit (HOSPITAL_COMMUNITY): Payer: Self-pay

## 2022-12-30 ENCOUNTER — Encounter: Payer: Self-pay | Admitting: Dietician

## 2022-12-30 ENCOUNTER — Encounter: Payer: Commercial Managed Care - PPO | Attending: Surgery | Admitting: Dietician

## 2022-12-30 VITALS — Ht 64.0 in | Wt 203.1 lb

## 2022-12-30 DIAGNOSIS — E669 Obesity, unspecified: Secondary | ICD-10-CM | POA: Insufficient documentation

## 2022-12-30 NOTE — Progress Notes (Signed)
2 Week Post-Operative Nutrition Class   Patient was seen on 12/30/2022 for Post-Operative Nutrition education at the [facility].    Surgery date: 12/15/2022 Surgery type: Sleeve Gastrectomy  Anthropometrics  Start weight at [facility]: 225.7 lbs (date: 09/15/2022)  Height: 64 in Weight today: 203.1 lbs BMI: 34.86 kg/m2     Clinical  Medical hx: Constipation, obesity, hyperlipidemia, thyroid disease Medications: Linzess, Topamax, levothyroxine, Vit D  Labs: Chol 237; LDL 163; A1C 6.0 Notable signs/symptoms: none noted Any previous deficiencies? No Bowel Habits: Every day to every other day no complaints   Body Composition Scale 12/30/2022  Current Body Weight 203.1  Total Body Fat % 40.5  Visceral Fat 11  Fat-Free Mass % 59.4   Total Body Water % 44.2  Muscle-Mass lbs 30.6  BMI 34.7  Body Fat Displacement          Torso  lbs 50.9         Left Leg  lbs 10.1         Right Leg  lbs 10.1         Left Arm  lbs 5.0         Right Arm   lbs 5.0      The following the learning objectives were met by the patient during this course: Identifies Phase 3 (Soft, High Proteins) Dietary Goals and will begin from 2 weeks post-operatively to 2 months post-operatively Identifies appropriate sources of fluids and proteins  Identifies appropriate fat sources and healthy verses unhealthy fat types   States protein recommendations and appropriate sources post-operatively Identifies the need for appropriate texture modifications, mastication, and bite sizes when consuming solids Identifies appropriate fat consumption and sources Identifies appropriate multivitamin and calcium sources post-operatively Describes the need for physical activity post-operatively and will follow MD recommendations States when to call healthcare provider regarding medication questions or post-operative complications   Handouts given during class include: Phase 3A: Soft, High Protein Diet  Handout Phase 3 High Protein Meals Healthy Fats   Follow-Up Plan: Patient will follow-up at [facility] in 6 weeks for 2 month post-op nutrition visit for diet advancement per MD.

## 2023-01-05 ENCOUNTER — Telehealth: Payer: Self-pay | Admitting: Dietician

## 2023-01-05 NOTE — Telephone Encounter (Signed)
RD called pt to verify fluid intake once starting soft, solid proteins 2 week post-bariatric surgery.   Daily Fluid intake: 48-64 oz Daily Protein intake: 60 grams Bowel Habits: using Murelax; no concerns  Concerns/issues: struggle on the weekends, but getting fluid and protein in during the week on a normal schedule

## 2023-01-19 ENCOUNTER — Other Ambulatory Visit (HOSPITAL_COMMUNITY): Payer: Self-pay

## 2023-01-19 MED ORDER — VITAMIN D (ERGOCALCIFEROL) 1.25 MG (50000 UNIT) PO CAPS
ORAL_CAPSULE | ORAL | 1 refills | Status: DC
  Filled 2023-01-19: qty 13, 90d supply, fill #0
  Filled 2023-04-16: qty 13, 90d supply, fill #1

## 2023-01-29 ENCOUNTER — Encounter: Payer: Self-pay | Admitting: Dietician

## 2023-01-29 ENCOUNTER — Encounter: Payer: Commercial Managed Care - PPO | Attending: Surgery | Admitting: Dietician

## 2023-01-29 VITALS — Ht 64.0 in | Wt 194.6 lb

## 2023-01-29 DIAGNOSIS — E669 Obesity, unspecified: Secondary | ICD-10-CM | POA: Diagnosis not present

## 2023-01-29 NOTE — Progress Notes (Signed)
Bariatric Nutrition Follow-Up Visit Medical Nutrition Therapy  Appt Start Time: 4:55   End Time: 5:22  Surgery date: 12/15/2022 Surgery type: Sleeve Gastrectomy  NUTRITION ASSESSMENT   Anthropometrics  Start weight at [facility]: 225.7 lbs (date: 09/15/2022)  Height: 64 in Weight today: 194.6 lbs BMI: 33.40 kg/m2     Clinical  Medical hx: Constipation, obesity, hyperlipidemia, thyroid disease Medications: Linzess, Topamax, levothyroxine, Vit D  Labs: Chol 237; LDL 163; A1C 6.0 Notable signs/symptoms: none noted Any previous deficiencies? No Bowel Habits: Every day to every other day no complaints   Body Composition Scale 12/30/2022 01/29/2023  Current Body Weight 203.1 194.6  Total Body Fat % 40.5 38.3  Visceral Fat 11 10  Fat-Free Mass % 59.4 61.6   Total Body Water % 44.2 45.3  Muscle-Mass lbs 30.6 31.4  BMI 34.7 33.3  Body Fat Displacement           Torso  lbs 50.9 46.1         Left Leg  lbs 10.1 9.2         Right Leg  lbs 10.1 9.2         Left Arm  lbs 5.0 4.6         Right Arm   lbs 5.0 4.6     Lifestyle & Dietary Hx  Pt states she has much more energy now, stating at first going to the mailbox was difficult. Pt is going to [place] on vacation, and is confident that she will stay on track. Pt is doing great.  Estimated daily fluid intake: 32-48 oz Estimated daily protein intake: 60 g Supplements: multivitamin and calcium Current average weekly physical activity: picking up; walking daily (2x), 40 minutes/day   24-Hr Dietary Recall First Meal: 2 boiled eggs Snack: sargento breaks without the fruit  Second Meal: chicken breast Snack: zero sugar jello  Third Meal: steak Snack: sf pop cycle Beverages: water, Gatorade Zero  Post-Op Goals/ Signs/ Symptoms Using straws: no Drinking while eating: no Chewing/swallowing difficulties: no Changes in vision: no Changes to mood/headaches: no Hair loss/changes to skin/nails: no Difficulty focusing/concentrating:  no Sweating: no Limb weakness: no Dizziness/lightheadedness: no Palpitations: no  Carbonated/caffeinated beverages: no N/V/D/C/Gas: no Abdominal pain: no Dumping syndrome: no    NUTRITION DIAGNOSIS  Overweight/obesity ([place]-3.3) related to past poor dietary habits and physical inactivity as evidenced by completed bariatric surgery and following dietary guidelines for continued weight loss and healthy nutrition status.     NUTRITION INTERVENTION Nutrition counseling (C-1) and education (E-2) to facilitate bariatric surgery goals, including: Diet advancement to the next phase including all vegetables and fruit. The importance of consuming adequate calories as well as certain nutrients daily due to the body's need for essential vitamins, minerals, and fats The importance of daily physical activity and to reach a goal of at least 150 minutes of moderate to vigorous physical activity weekly (or as directed by their physician) due to benefits such as increased musculature and improved lab values The importance of intuitive eating specifically learning hunger-satiety cues and understanding the importance of learning a new body: The importance of mindful eating to avoid grazing behaviors   Handouts Provided Include  Non-starchy vegetable and fruit lists Bariatric MyPlate  Learning Style & Readiness for Change Teaching method utilized: Visual & Auditory  Demonstrated degree of understanding via: Teach Back  Readiness Level: Action Barriers to learning/adherence to lifestyle change: nothing identified  RD's Notes for Next Visit Assess adherence to pt chosen goals   MONITORING &  EVALUATION Dietary intake, weekly physical activity, body weight.  Next Steps Patient is to follow-up in 4 months for 6 month post-op follow-up.

## 2023-02-16 ENCOUNTER — Other Ambulatory Visit (HOSPITAL_COMMUNITY): Payer: Self-pay

## 2023-02-16 ENCOUNTER — Encounter: Payer: Self-pay | Admitting: Psychiatry

## 2023-02-16 ENCOUNTER — Ambulatory Visit: Payer: Commercial Managed Care - PPO | Admitting: Psychiatry

## 2023-02-16 ENCOUNTER — Ambulatory Visit: Payer: Self-pay | Admitting: Dietician

## 2023-02-16 VITALS — BP 123/83 | HR 77 | Ht 64.0 in | Wt 191.5 lb

## 2023-02-16 DIAGNOSIS — G43101 Migraine with aura, not intractable, with status migrainosus: Secondary | ICD-10-CM

## 2023-02-16 MED ORDER — SUMATRIPTAN SUCCINATE 100 MG PO TABS
100.0000 mg | ORAL_TABLET | ORAL | 6 refills | Status: DC | PRN
  Filled 2023-02-16: qty 9, 30d supply, fill #0
  Filled 2023-07-09: qty 9, 30d supply, fill #1

## 2023-02-16 MED ORDER — KETOROLAC TROMETHAMINE 60 MG/2ML IM SOLN
60.0000 mg | Freq: Once | INTRAMUSCULAR | Status: AC
  Administered 2023-02-16: 60 mg via INTRAMUSCULAR

## 2023-02-16 NOTE — Progress Notes (Signed)
   CC:  headaches  Follow-up Visit  Last visit: 07/15/22  Brief HPI: 42 year old female with a history of hypothyroidism who follows in clinic for migraine with aura.  At her last visit, brain MRI was ordered. She was counseled on medication overuse headache. Topamax was started for prevention and Imitrex was started for rescue.  Interval History: Headaches were well-controlled until 2 weeks ago. She recently went on vacation and headache began around the time she returned. She has had a constant headache for ~2 weeks. She does note increased stress at work and is actively looking for another job. She is tolerating Topamax 100 mg QHS well without side effects. Imitrex 50 mg PRN has not been effective. She has been taking OTCs as needed, but these have also not been helpful.  Brain MRI 07/22/22 showed mild nonspecific white matter changes and was otherwise unremarkable.   Migraine days per month: 14 Headache free days per month: 16  Current Headache Regimen: Preventative: Topamax 100 mg QHS Abortive: Imitrex 50 mg PRN   Prior Therapies                                  Rescue: Maxalt - lack of efficacy Imitrex 50 mg PRN - lack of efficacy Excedrin Aleve Tylenol Zofran 4 mg PRN  Prevention: Topamax 100 mg QHS Gabapentin 200 mg BID  Physical Exam:   Vital Signs: BP 123/83   Pulse 77   Ht 5' 4"$  (1.626 m)   Wt 191 lb 8 oz (86.9 kg)   BMI 32.87 kg/m  GENERAL:  well appearing, in no acute distress, alert  SKIN:  Color, texture, turgor normal. No rashes or lesions HEAD:  Normocephalic/atraumatic. RESP: normal respiratory effort MSK:  No gross joint deformities.   NEUROLOGICAL: Mental Status: Alert, oriented to person, place and time, Follows commands, and Speech fluent and appropriate. Cranial Nerves: PERRL, face symmetric, no dysarthria, hearing grossly intact Motor: moves all extremities equally Gait: normal-based.  IMPRESSION: 43 year old female with a history of  hypothyroidism who presents for follow up of migraines. Headaches were well-controlled on Topamax until 2 weeks ago when she developed a constant headache. Will give Toradol shot to day to try and break her headache. Discussed increasing Topamax dose if unable to break headache with short term medications. Imitrex 50 mg was ineffective, will increase to 100 mg PRN.  PLAN: -Prevention: Continue Topamax 100 mg QHS -Rescue: Increase Imitrex to 100 mg PRN -Toradol shot given in office today. Will send in medrol dosepak if headache does not break with Toradol alone. -Next steps: increase Topamax to 25/100 if unable to break headache with Toradol/medrol   Follow-up: 7 months  I spent a total of 21 minutes on the date of the service. Headache education was done. Discussed treatment options including preventive and acute medications. Discussed medication side effects, adverse reactions and drug interactions. Written educational materials and patient instructions outlining all of the above were given.  Genia Harold, MD 02/16/23 9:21 AM

## 2023-02-16 NOTE — Patient Instructions (Signed)
We gave you a Toradol shot today. Please let me know if headache doesn't improve in the next 24 hours and I will send in medication to your pharmacy to help break your headache  Increase Imitrex (sumatriptan) to 100 mg as needed for migraine

## 2023-02-18 ENCOUNTER — Encounter: Payer: Self-pay | Admitting: Psychiatry

## 2023-02-26 ENCOUNTER — Other Ambulatory Visit (HOSPITAL_COMMUNITY): Payer: Self-pay

## 2023-02-26 MED ORDER — DIAZEPAM 5 MG PO TABS
5.0000 mg | ORAL_TABLET | ORAL | 0 refills | Status: DC
  Filled 2023-02-26: qty 2, 1d supply, fill #0

## 2023-03-27 ENCOUNTER — Other Ambulatory Visit: Payer: Self-pay | Admitting: Psychiatry

## 2023-03-30 ENCOUNTER — Other Ambulatory Visit (HOSPITAL_COMMUNITY): Payer: Self-pay

## 2023-03-30 MED ORDER — TOPIRAMATE 100 MG PO TABS
100.0000 mg | ORAL_TABLET | Freq: Every day | ORAL | 1 refills | Status: DC
  Filled 2023-03-30: qty 90, 90d supply, fill #0
  Filled 2023-07-09: qty 90, 90d supply, fill #1

## 2023-03-30 NOTE — Telephone Encounter (Signed)
Last seen on 02/16/23 Follow up scheduled on 09/23/23 Pharmacy request 90 day supply

## 2023-04-17 ENCOUNTER — Other Ambulatory Visit: Payer: Self-pay

## 2023-04-17 ENCOUNTER — Other Ambulatory Visit (HOSPITAL_COMMUNITY): Payer: Self-pay

## 2023-04-20 ENCOUNTER — Other Ambulatory Visit (HOSPITAL_COMMUNITY): Payer: Self-pay

## 2023-05-05 ENCOUNTER — Other Ambulatory Visit: Payer: Self-pay | Admitting: Gastroenterology

## 2023-05-07 ENCOUNTER — Other Ambulatory Visit (HOSPITAL_COMMUNITY): Payer: Self-pay

## 2023-05-07 ENCOUNTER — Other Ambulatory Visit: Payer: Self-pay | Admitting: Gastroenterology

## 2023-05-07 MED ORDER — LINACLOTIDE 290 MCG PO CAPS
290.0000 ug | ORAL_CAPSULE | Freq: Every day | ORAL | 1 refills | Status: DC
  Filled 2023-05-07: qty 90, 90d supply, fill #0
  Filled 2023-08-05: qty 90, 90d supply, fill #1

## 2023-05-12 ENCOUNTER — Ambulatory Visit: Payer: Commercial Managed Care - PPO | Admitting: Skilled Nursing Facility1

## 2023-05-14 ENCOUNTER — Encounter: Payer: Self-pay | Admitting: Skilled Nursing Facility1

## 2023-05-14 ENCOUNTER — Encounter: Payer: Commercial Managed Care - PPO | Attending: Surgery | Admitting: Skilled Nursing Facility1

## 2023-05-14 VITALS — Ht 64.0 in | Wt 177.1 lb

## 2023-05-14 DIAGNOSIS — E669 Obesity, unspecified: Secondary | ICD-10-CM | POA: Insufficient documentation

## 2023-05-14 NOTE — Progress Notes (Signed)
Bariatric Nutrition Follow-Up Visit Medical Nutrition Therapy   Surgery date: 12/15/2022 Surgery type: Sleeve Gastrectomy  NUTRITION ASSESSMENT   Anthropometrics  Start weight at [facility]: 225.7 lbs (date: 09/15/2022)  Height: 64 in Weight today: 194.6 lbs BMI: 33.40 kg/m2     Clinical  Medical hx: Constipation, obesity, hyperlipidemia, thyroid disease Medications: Linzess, Topamax, levothyroxine, Vit D  Labs: Chol 237; LDL 163; A1C 6.0 Notable signs/symptoms: none noted Any previous deficiencies? No Bowel Habits: Every day to every other day no complaints   Body Composition Scale 12/30/2022 01/29/2023 05/14/2023  Current Body Weight 203.1 194.6 177.1  Total Body Fat % 40.5 38.3 35.2  Visceral Fat 11 10 9   Fat-Free Mass % 59.4 61.6 64.7   Total Body Water % 44.2 45.3 46.8  Muscle-Mass lbs 30.6 31.4 31.2  BMI 34.7 33.3 30.2  Body Fat Displacement            Torso  lbs 50.9 46.1 38.5         Left Leg  lbs 10.1 9.2 7.7         Right Leg  lbs 10.1 9.2 7.7         Left Arm  lbs 5.0 4.6 3.8         Right Arm   lbs 5.0 4.6 3.8     Lifestyle & Dietary Hx   Pt states she does not wish to lose any more weight.  Pt states she is not worried at all about gaining her weight back.  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    Estimated daily fluid intake: 40-64 oz Estimated daily protein intake: 60 g Supplements: multivitamin and calcium Current average weekly physical activity: walking daily (2x), 40 minutes/5 days; some weights 2 days a week    24-Hr Dietary Recall First Meal: 2 boiled eggs or yogurt + berries and granola Snack: sargento breaks  Second Meal: chicken breast or pita + [place] + cheese  + lettuce Snack: banana or apple Third Meal: meatball mashed potato and green bean or pepper steak, rice and broccoli Snack:  Beverages: water, Gatorade Zero, coke zero  Post-Op Goals/ Signs/ Symptoms Using straws: no Drinking while eating: no Chewing/swallowing difficulties:  no Changes in vision: no Changes to mood/headaches: no Hair loss/changes to skin/nails: no Difficulty focusing/concentrating: no Sweating: no Limb weakness: no Dizziness/lightheadedness: no Palpitations: no  Carbonated/caffeinated beverages: no N/V/D/C/Gas: no Abdominal pain: no Dumping syndrome: no    NUTRITION DIAGNOSIS  Overweight/obesity ([place]-3.3) related to past poor dietary habits and physical inactivity as evidenced by completed bariatric surgery and following dietary guidelines for continued weight loss and healthy nutrition status.     NUTRITION INTERVENTION Nutrition counseling (C-1) and education (E-2) to facilitate bariatric surgery goals, including: Diet advancement to the next phase including all vegetables and fruit. The importance of consuming adequate calories as well as certain nutrients daily due to the body's need for essential vitamins, minerals, and fats The importance of daily physical activity and to reach a goal of at least 150 minutes of moderate to vigorous physical activity weekly (or as directed by their physician) due to benefits such as increased musculature and improved lab values The importance of intuitive eating specifically learning hunger-satiety cues and understanding the importance of learning a new body: The importance of mindful eating to avoid grazing behaviors  Creation of balanced and diverse meals to increase the intake of nutrient-rich foods that provide essential vitamins, minerals, fiber, and phytonutrients  Variety of Fruits and Vegetables:  Aim for a colorful array of fruits and vegetables to ensure a wide range of nutrients. Include a mix of leafy greens, berries, citrus fruits, cruciferous vegetables, and more. Whole Grains: Choose whole grains over refined grains. Examples include brown rice, quinoa, oats, whole wheat, and barley. Lean Proteins: Include lean sources of protein, such as poultry, fish, tofu, legumes, beans, lentils,  and low-fat dairy products. Limit red and processed meats. Healthy Fats: Incorporate sources of healthy fats, including avocados, nuts, seeds, and olive oil. Limit saturated and trans fats found in fried and processed foods. Dairy or Dairy Alternatives: Choose low-fat or fat-free dairy products, or plant-based alternatives like almond or soy milk. Portion Control: Be mindful of portion sizes to avoid overeating. Pay attention to hunger and satisfaction cues. Limit Added Sugars: Minimize the consumption of sugary beverages, snacks, and desserts. Check food labels for added sugars and opt for natural sources of sweetness such as whole fruits. Hydration: Drink plenty of water throughout the day. Limit sugary drinks and excessive caffeine intake. Moderate Sodium Intake: Reduce the consumption of high-sodium foods. Use herbs and spices for flavor instead of excessive salt. Meal Planning and Preparation: Plan and prepare meals ahead of time to make healthier choices more convenient. Include a mix of food groups in each meal. Limit Processed Foods: Minimize the intake of highly processed and packaged foods that are often high in added sugars, salt, and unhealthy fats. Regular Physical Activity: Combine a healthy diet with regular physical activity for overall well-being. Aim for at least 150 minutes of moderate-intensity aerobic exercise per week, along with strength training. Moderation and Balance: Enjoy treats and indulgent foods in moderation, emphasizing balance rather than strict restriction.  Handouts Provided Include  6 month handout   Learning Style & Readiness for Change Teaching method utilized: Visual & Auditory  Demonstrated degree of understanding via: Teach Back  Readiness Level: Action Barriers to learning/adherence to lifestyle change: nothing identified  RD's Notes for Next Visit Assess adherence to pt chosen goals   MONITORING & EVALUATION Dietary intake, weekly  physical activity, body weight.  Next Steps Patient is to follow-up in 4 months

## 2023-06-15 ENCOUNTER — Other Ambulatory Visit (HOSPITAL_COMMUNITY): Payer: Self-pay

## 2023-06-15 DIAGNOSIS — E663 Overweight: Secondary | ICD-10-CM | POA: Diagnosis not present

## 2023-06-15 DIAGNOSIS — G43009 Migraine without aura, not intractable, without status migrainosus: Secondary | ICD-10-CM | POA: Diagnosis not present

## 2023-06-15 DIAGNOSIS — Z903 Acquired absence of stomach [part of]: Secondary | ICD-10-CM | POA: Diagnosis not present

## 2023-06-15 DIAGNOSIS — E890 Postprocedural hypothyroidism: Secondary | ICD-10-CM | POA: Diagnosis not present

## 2023-06-15 DIAGNOSIS — E559 Vitamin D deficiency, unspecified: Secondary | ICD-10-CM | POA: Diagnosis not present

## 2023-06-15 MED ORDER — VITAMIN D (ERGOCALCIFEROL) 1.25 MG (50000 UNIT) PO CAPS
ORAL_CAPSULE | ORAL | 3 refills | Status: AC
Start: 2023-06-15 — End: ?
  Filled 2023-06-15 – 2023-09-24 (×2): qty 12, 84d supply, fill #0
  Filled 2023-12-15: qty 12, 84d supply, fill #1
  Filled 2024-04-06: qty 12, 84d supply, fill #2

## 2023-06-16 ENCOUNTER — Other Ambulatory Visit (HOSPITAL_COMMUNITY): Payer: Self-pay

## 2023-06-19 DIAGNOSIS — Z903 Acquired absence of stomach [part of]: Secondary | ICD-10-CM | POA: Diagnosis not present

## 2023-07-09 ENCOUNTER — Other Ambulatory Visit (HOSPITAL_COMMUNITY): Payer: Self-pay

## 2023-09-15 ENCOUNTER — Encounter: Payer: Self-pay | Admitting: Dietician

## 2023-09-15 ENCOUNTER — Encounter: Payer: Commercial Managed Care - PPO | Attending: Surgery | Admitting: Dietician

## 2023-09-15 VITALS — Ht 64.0 in | Wt 170.6 lb

## 2023-09-15 DIAGNOSIS — E669 Obesity, unspecified: Secondary | ICD-10-CM | POA: Insufficient documentation

## 2023-09-15 NOTE — Progress Notes (Signed)
Bariatric Nutrition Follow-Up Visit Medical Nutrition Therapy   Surgery date: 12/15/2022 Surgery type: Sleeve Gastrectomy  NUTRITION ASSESSMENT   Anthropometrics  Start weight at [facility]: 225.7 lbs (date: 09/15/2022)  Height: 64 in Weight today: 194.6 lbs   Clinical  Medical hx: Constipation, obesity, hyperlipidemia, thyroid disease Medications: Linzess, Topamax, levothyroxine, Vit D  Labs: Chol 237; LDL 163; A1C 6.0 Notable signs/symptoms: none noted Any previous deficiencies? No Bowel Habits: Every day to every other day no complaints   Body Composition Scale 12/30/2022 01/29/2023 05/14/2023 09/15/2023  Current Body Weight 203.1 194.6 177.1 170.6  Total Body Fat % 40.5 38.3 35.2 34.7  Visceral Fat 11 10 9 9   Fat-Free Mass % 59.4 61.6 64.7 65.2   Total Body Water % 44.2 45.3 46.8 47.1  Muscle-Mass lbs 30.6 31.4 31.2 30.6  BMI 34.7 33.3 30.2 29.8  Body Fat Displacement             Torso  lbs 50.9 46.1 38.5 36.5         Left Leg  lbs 10.1 9.2 7.7 7.3         Right Leg  lbs 10.1 9.2 7.7 7.3         Left Arm  lbs 5.0 4.6 3.8 3.6         Right Arm   lbs 5.0 4.6 3.8 3.6     Lifestyle & Dietary Hx  Pt states she does not have any concerns. Pt states she is doing more activity than last visit. Pt states she started a new app (mynetdiary) for logging her food, and making sure she gets her protein and fluids. Pt states she is still taking her multivitamin and calcium. Pt is doing great. Pt states she does not have a fear of going back, stating she has the tools that work.  Estimated daily fluid intake: 64 oz Estimated daily protein intake: 60 g Supplements: multivitamin and calcium Current average weekly physical activity: walking daily, 40 minutes/5 days; some weights 2 days a week; additional walk with husband 3 days a week for one mile  24-Hr Dietary Recall First Meal: 2 boiled eggs or yogurt + berries and granola Snack: sargento breaks  Second Meal: chicken breast or  pita + [place] + cheese  + lettuce Snack: banana or apple Third Meal: meatball mashed potato and green bean or pepper steak, rice and broccoli Snack:  Beverages: water, Gatorade Zero, coke zero  Post-Op Goals/ Signs/ Symptoms Using straws: no Drinking while eating: no Chewing/swallowing difficulties: no Changes in vision: no Changes to mood/headaches: no Hair loss/changes to skin/nails: no Difficulty focusing/concentrating: no Sweating: no Limb weakness: no Dizziness/lightheadedness: no Palpitations: no  Carbonated/caffeinated beverages: no N/V/D/C/Gas: no Abdominal pain: no Dumping syndrome: no    NUTRITION DIAGNOSIS  Overweight/obesity ([place]-3.3) related to past poor dietary habits and physical inactivity as evidenced by completed bariatric surgery and following dietary guidelines for continued weight loss and healthy nutrition status.     NUTRITION INTERVENTION Nutrition counseling (C-1) and education (E-2) to facilitate bariatric surgery goals, including: The importance of consuming adequate calories as well as certain nutrients daily due to the body's need for essential vitamins, minerals, and fats The importance of daily physical activity and to reach a goal of at least 150 minutes of moderate to vigorous physical activity weekly (or as directed by their physician) due to benefits such as increased musculature and improved lab values The importance of intuitive eating specifically learning hunger-satiety cues and understanding the importance of learning  a new body: The importance of mindful eating to avoid grazing behaviors.  Goals Keep up the good work New/re-engage: add resistance exercises  Handouts Provided Include    Learning Style & Readiness for Change Teaching method utilized: Visual & Auditory  Demonstrated degree of understanding via: Teach Back  Readiness Level: Action Barriers to learning/adherence to lifestyle change: nothing identified  RD's Notes for  Next Visit Assess adherence to pt chosen goals  MONITORING & EVALUATION Dietary intake, weekly physical activity, body weight.  Next Steps Patient will call for an appointment when needed.

## 2023-09-16 ENCOUNTER — Ambulatory Visit: Payer: Commercial Managed Care - PPO | Admitting: Skilled Nursing Facility1

## 2023-09-23 ENCOUNTER — Other Ambulatory Visit (HOSPITAL_COMMUNITY): Payer: Self-pay

## 2023-09-23 ENCOUNTER — Encounter: Payer: Self-pay | Admitting: Psychiatry

## 2023-09-23 ENCOUNTER — Ambulatory Visit: Payer: Commercial Managed Care - PPO | Admitting: Psychiatry

## 2023-09-23 VITALS — BP 140/99 | HR 88 | Ht 63.0 in | Wt 171.0 lb

## 2023-09-23 DIAGNOSIS — G43101 Migraine with aura, not intractable, with status migrainosus: Secondary | ICD-10-CM

## 2023-09-23 MED ORDER — NORTRIPTYLINE HCL 25 MG PO CAPS
25.0000 mg | ORAL_CAPSULE | Freq: Every day | ORAL | 3 refills | Status: DC
  Filled 2023-09-23: qty 30, 30d supply, fill #0
  Filled 2023-10-20: qty 30, 30d supply, fill #1

## 2023-09-23 NOTE — Patient Instructions (Signed)
Decrease Topamax to 50 mg (1/2 tablet) at bedtime for 1 week, then stop  Start nortriptyline 25 mg at bedtime for headache prevention  Try Nurtec as needed for migraines. If this works for you let us know and we will send in a prescription

## 2023-09-23 NOTE — Progress Notes (Signed)
   CC:  headaches  Follow-up Visit  Last visit: 02/16/23  Brief HPI: 42 year old female with a history of hypothyroidism who follows in clinic for migraine with aura. Brain MRI 07/22/22 showed mild nonspecific white matter changes and was otherwise unremarkable.   At her last visit she was continued on Topamax for prevention and Imitrex was increased to 100 mg for rescue.  Interval History: Headaches were relatively well-controlled until July. Since then she has had headaches nearly every day. She has also started to notice word finding difficulty and trouble finishing her sentences while on Topamax. She has not noticed much of a difference with 100 mg Imitrex dose, but notes it does make her sleepy.   She also notes trouble sleeping. Will sleep until about 4 am then has difficulty falling back asleep.  Migraine days per month: 30 Headache free days per month: 0  Current Headache Regimen: Preventative: Topamax 100 mg QHS Abortive: Imitrex 100 mg PRN   Prior Therapies                                  Rescue: Maxalt - lack of efficacy Imitrex 100 mg PRN - lack of efficacy, drowsy Excedrin Aleve Tylenol Zofran 4 mg PRN   Prevention: Topamax 100 mg at bedtime - word finding difficulty Gabapentin 200 mg BID  Physical Exam:   Vital Signs: BP (!) 140/99 (BP Location: Left Arm, Patient Position: Sitting, Cuff Size: Normal)   Pulse 88   Ht 5\' 3"  (1.6 m)   Wt 171 lb (77.6 kg)   BMI 30.29 kg/m  GENERAL:  well appearing, in no acute distress, alert  SKIN:  Color, texture, turgor normal. No rashes or lesions HEAD:  Normocephalic/atraumatic. RESP: normal respiratory effort MSK:  No gross joint deformities.   NEUROLOGICAL: Mental Status: Alert, oriented to person, place and time, Follows commands, and Speech fluent and appropriate. Cranial Nerves: PERRL, face symmetric, no dysarthria, hearing grossly intact Motor: moves all extremities equally Gait:  normal-based.  IMPRESSION: 42 year old female with a history of hypothyroidism who presents for follow up of migraines. She has had worsening of her migraines over the past 3 months, which have not been controlled with Topamax or Imitrex. Also has started to notice word-finding difficulty on Topamax. Will wean off of Topamax and start nortriptyline for migraine prevention. Nurtec sample provided for rescue. Will send in a prescription if this is effective.  PLAN: -Prevention: Decrease Topamax to 50 mg at bedtime for 1 week, then stop. Start nortriptyline 25 mg at bedtime -Rescue: Nurtec sample provided, will send in rx if this is effective   Follow-up: 6 months  I spent a total of 20 minutes on the date of the service. Headache education was done. Discussed treatment options including preventive and acute medications. Discussed medication side effects, adverse reactions and drug interactions. Written educational materials and patient instructions outlining all of the above were given.  Ocie Doyne, MD 09/23/23 9:24 AM

## 2023-09-24 ENCOUNTER — Other Ambulatory Visit (HOSPITAL_COMMUNITY): Payer: Self-pay

## 2023-09-25 ENCOUNTER — Other Ambulatory Visit (HOSPITAL_COMMUNITY): Payer: Self-pay

## 2023-11-04 ENCOUNTER — Encounter: Payer: Self-pay | Admitting: Neurology

## 2023-11-04 ENCOUNTER — Other Ambulatory Visit: Payer: Self-pay | Admitting: Gastroenterology

## 2023-11-05 ENCOUNTER — Other Ambulatory Visit (HOSPITAL_COMMUNITY): Payer: Self-pay

## 2023-11-05 MED ORDER — NURTEC 75 MG PO TBDP
ORAL_TABLET | ORAL | 0 refills | Status: DC
  Filled 2023-11-05: qty 8, 30d supply, fill #0

## 2023-11-07 ENCOUNTER — Other Ambulatory Visit (HOSPITAL_COMMUNITY): Payer: Self-pay

## 2023-11-09 ENCOUNTER — Telehealth: Payer: Self-pay | Admitting: Gastroenterology

## 2023-11-09 ENCOUNTER — Other Ambulatory Visit (HOSPITAL_COMMUNITY): Payer: Self-pay

## 2023-11-09 MED ORDER — LINACLOTIDE 290 MCG PO CAPS
290.0000 ug | ORAL_CAPSULE | Freq: Every day | ORAL | 0 refills | Status: DC
  Filled 2023-11-09: qty 90, 90d supply, fill #0

## 2023-11-09 NOTE — Telephone Encounter (Signed)
PT is calling to get a refill on Linzess. She has scheduled an OV but needs a new script sent in to [facility] pharmacy.

## 2023-11-09 NOTE — Telephone Encounter (Signed)
Prescription sent to patient's pharmacy until scheduled appt. 

## 2023-11-11 ENCOUNTER — Other Ambulatory Visit (HOSPITAL_COMMUNITY): Payer: Self-pay

## 2023-11-11 ENCOUNTER — Ambulatory Visit: Payer: Commercial Managed Care - PPO | Admitting: Gastroenterology

## 2023-11-11 ENCOUNTER — Encounter: Payer: Self-pay | Admitting: Gastroenterology

## 2023-11-11 VITALS — BP 118/80 | HR 89 | Ht 63.0 in

## 2023-11-11 DIAGNOSIS — K5904 Chronic idiopathic constipation: Secondary | ICD-10-CM

## 2023-11-11 MED ORDER — LINACLOTIDE 290 MCG PO CAPS
290.0000 ug | ORAL_CAPSULE | Freq: Every day | ORAL | 3 refills | Status: AC
  Filled 2023-11-11 – 2024-02-08 (×2): qty 90, 90d supply, fill #0
  Filled 2024-05-06: qty 90, 90d supply, fill #1
  Filled 2024-08-10: qty 90, 90d supply, fill #2
  Filled 2024-11-09: qty 90, 90d supply, fill #3

## 2023-11-11 NOTE — Patient Instructions (Signed)
We have sent the following medications to your pharmacy for you to pick up at your convenience: Linzess 290 mcg daily.   Follow up with Dr. Doy Hutching.  The [facility] providers would like to encourage you to use [facility] to communicate with providers for non-urgent requests or questions.  Due to long hold times on the telephone, sending your provider a message by [facility] may be a faster and more efficient way to get a response.  Please allow 48 business hours for a response.  Please remember that this is for non-urgent requests.   Thank you for choosing me and [facility].  Venita Lick. Pleas Koch., MD., Clementeen Graham

## 2023-11-11 NOTE — Progress Notes (Signed)
    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   CRC screening, average risk  Recommendations    Continue Linzess 290 mcg qd and Dulcolax 1 po qd.  Maintain adequate hydration with at least 6 glasses of water daily.   Screening colonoscopy at age 42, January 2026. REV in 1 year with Dr. Doy Hutching     HPI    This is a 42 year old female with CIC.  Her constipation is well-controlled on Linzess and Dulcolax daily.  S/P gastric sleeve in Dec 2023 with an uneventful recovery.  She relates a 50 pounds weight loss since her surgery.  She has no gastrointestinal complaints.   Labs / Imaging       Latest Ref Rng & Units 12/02/2022    2:25 PM 04/07/2022    4:59 PM 05/07/2020   10:56 AM  Hepatic Function  Total Protein 6.5 - 8.1 g/dL 7.7  6.9  7.1   Albumin 3.5 - 5.0 g/dL 3.9   4.1   AST 15 - 41 U/L 14  13  22    ALT 0 - 44 U/L 15  20  17    Alk Phosphatase 38 - 126 U/L 46   51   Total Bilirubin 0.3 - 1.2 mg/dL 0.5  0.2  0.2        Latest Ref Rng & Units 12/16/2022    5:33 AM 12/15/2022   12:11 PM 12/15/2022   12:10 PM  CBC  WBC 4.0 - 10.5 K/uL 9.4   11.0   Hemoglobin 12.0 - 15.0 g/dL 29.5  28.4  13.2   Hematocrit 36.0 - 46.0 % 37.2  41.0  41.6   Platelets 150 - 400 K/uL 244   251    Current Medications, Allergies, Past Medical History, Past Surgical History, Family History and Social History were reviewed in [employer] record.   Physical Exam: General: Well developed, well nourished, no acute distress Head: Normocephalic and atraumatic Eyes: Sclerae anicteric, EOMI Ears: Normal auditory acuity Mouth: No deformities or lesions noted Lungs: Clear throughout to auscultation Heart: Regular rate and rhythm; No murmurs, rubs or bruits Abdomen: Soft, non tender and non distended. No masses, hepatosplenomegaly or hernias noted. Normal Bowel sounds Rectal: Not done Musculoskeletal: Symmetrical with no gross deformities  Pulses:   Normal pulses noted Extremities: No edema or deformities noted Neurological: Alert oriented x 4, grossly nonfocal Psychological:  Alert and cooperative. Normal mood and affect   Derward Marple T. Russella Dar, MD 11/11/2023, 8:33 AM

## 2023-11-16 ENCOUNTER — Other Ambulatory Visit (HOSPITAL_COMMUNITY): Payer: Self-pay

## 2023-11-16 ENCOUNTER — Telehealth: Payer: Self-pay

## 2023-11-16 NOTE — Telephone Encounter (Signed)
Pharmacy Patient Advocate Encounter   Received notification from CoverMyMeds that prior authorization for Nurtec 75MG  dispersible tablets is required/requested.   Insurance verification completed.   The patient is insured through [facility] .   Per test claim: PA required; PA submitted to above mentioned insurance via CoverMyMeds Key/confirmation #/EOC [id number] Status is pending

## 2023-11-17 ENCOUNTER — Other Ambulatory Visit (HOSPITAL_COMMUNITY): Payer: Self-pay

## 2023-11-17 NOTE — Telephone Encounter (Signed)
We can try her on blood pressure medication such as propanolol

## 2023-11-18 ENCOUNTER — Other Ambulatory Visit (HOSPITAL_COMMUNITY): Payer: Self-pay

## 2023-11-18 ENCOUNTER — Other Ambulatory Visit: Payer: Self-pay | Admitting: Neurology

## 2023-11-18 MED ORDER — PROPRANOLOL HCL 20 MG PO TABS
20.0000 mg | ORAL_TABLET | Freq: Two times a day (BID) | ORAL | 3 refills | Status: DC
  Filled 2023-11-18: qty 60, 30d supply, fill #0
  Filled 2023-12-15: qty 60, 30d supply, fill #1

## 2023-11-18 NOTE — Telephone Encounter (Signed)
Propanolol 20 mg twice daily. Extended release is not covered, we can increase to 40 mg BID in 2 weeks if no improvement of the headache frequency

## 2023-11-18 NOTE — Telephone Encounter (Signed)
Pharmacy Patient Advocate Encounter  Received notification from [facility] that Prior Authorization for Nurtec 75MG  dispersible tablets has been APPROVED from 11/17/2023 to 05/15/2024. Ran test claim, Copay is $0.00. This test claim was processed through [facility]- copay amounts may vary at other pharmacies due to pharmacy/plan contracts, or as the patient moves through the different stages of their insurance plan.   PA #/Case ID/Reference #: [id number]   Pharmacy notified of approval.

## 2023-12-08 DIAGNOSIS — Z1331 Encounter for screening for depression: Secondary | ICD-10-CM | POA: Diagnosis not present

## 2023-12-08 DIAGNOSIS — Z01419 Encounter for gynecological examination (general) (routine) without abnormal findings: Secondary | ICD-10-CM | POA: Diagnosis not present

## 2023-12-14 DIAGNOSIS — Z1231 Encounter for screening mammogram for malignant neoplasm of breast: Secondary | ICD-10-CM | POA: Diagnosis not present

## 2023-12-16 ENCOUNTER — Other Ambulatory Visit (HOSPITAL_COMMUNITY): Payer: Self-pay

## 2023-12-16 DIAGNOSIS — E039 Hypothyroidism, unspecified: Secondary | ICD-10-CM | POA: Diagnosis not present

## 2023-12-16 DIAGNOSIS — E7800 Pure hypercholesterolemia, unspecified: Secondary | ICD-10-CM | POA: Diagnosis not present

## 2023-12-16 DIAGNOSIS — R7303 Prediabetes: Secondary | ICD-10-CM | POA: Diagnosis not present

## 2023-12-16 DIAGNOSIS — E890 Postprocedural hypothyroidism: Secondary | ICD-10-CM | POA: Diagnosis not present

## 2023-12-16 MED ORDER — LEVOTHYROXINE SODIUM 125 MCG PO TABS
125.0000 ug | ORAL_TABLET | Freq: Every morning | ORAL | 3 refills | Status: DC
  Filled 2023-12-16 – 2024-01-29 (×2): qty 90, 90d supply, fill #0
  Filled 2024-05-06 – 2024-05-16 (×3): qty 90, 90d supply, fill #1
  Filled 2024-08-10 (×2): qty 90, 90d supply, fill #2
  Filled 2024-11-09: qty 90, 90d supply, fill #3

## 2024-01-01 DIAGNOSIS — Z9884 Bariatric surgery status: Secondary | ICD-10-CM | POA: Diagnosis not present

## 2024-01-01 DIAGNOSIS — Z903 Acquired absence of stomach [part of]: Secondary | ICD-10-CM | POA: Diagnosis not present

## 2024-01-26 ENCOUNTER — Encounter: Payer: Self-pay | Admitting: Neurology

## 2024-01-28 ENCOUNTER — Other Ambulatory Visit: Payer: Self-pay

## 2024-01-28 ENCOUNTER — Other Ambulatory Visit (HOSPITAL_COMMUNITY): Payer: Self-pay

## 2024-01-28 ENCOUNTER — Ambulatory Visit: Payer: Commercial Managed Care - PPO | Admitting: Neurology

## 2024-01-28 VITALS — BP 118/68 | HR 72 | Ht 64.0 in | Wt 189.6 lb

## 2024-01-28 DIAGNOSIS — R635 Abnormal weight gain: Secondary | ICD-10-CM

## 2024-01-28 DIAGNOSIS — Z9884 Bariatric surgery status: Secondary | ICD-10-CM

## 2024-01-28 DIAGNOSIS — E66811 Obesity, class 1: Secondary | ICD-10-CM | POA: Diagnosis not present

## 2024-01-28 DIAGNOSIS — G43909 Migraine, unspecified, not intractable, without status migrainosus: Secondary | ICD-10-CM

## 2024-01-28 DIAGNOSIS — R519 Headache, unspecified: Secondary | ICD-10-CM

## 2024-01-28 MED ORDER — AJOVY 225 MG/1.5ML ~~LOC~~ SOAJ
225.0000 mg | SUBCUTANEOUS | 3 refills | Status: DC
  Filled 2024-01-28: qty 1.5, 30d supply, fill #0
  Filled 2024-03-07: qty 1.5, 30d supply, fill #1
  Filled 2024-04-06: qty 1.5, 30d supply, fill #2
  Filled 2024-05-06: qty 1.5, 30d supply, fill #3

## 2024-01-28 MED ORDER — PROPRANOLOL HCL 20 MG PO TABS
20.0000 mg | ORAL_TABLET | Freq: Three times a day (TID) | ORAL | 3 refills | Status: DC
  Filled 2024-01-28: qty 90, 30d supply, fill #0

## 2024-01-28 NOTE — Progress Notes (Signed)
Subjective:    Patient ID: Susan Shields is a 43 y.o. female.  HPI    Huston Foley, MD, PhD [facility] [address], 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  The patient is unaccompanied today.  She previously followed in this office with Dr. Ocie Doyne and was last seen by her on 09/23/2023.  I reviewed the note and prior note and copy the notes below for reference.   Today, 01/28/2024: She reports that she started having a migraine this past weekend.  She took her Nurtec about a day later but it did not seem effective.  She realizes that she may have taken the medicine too late.  She was not sure if this was going to be a migraine headache so she did not take it right away.  She was started on propranolol by Dr. Teresa Coombs in this office on the call at 20 mg twice daily.  She continues to take it.  She does not know if it is effective.  She stopped taking the Pamelor back in November, she felt it made her constipated.  She is no longer on sumatriptan hand, she did not think it helped but again, she may not have always taken it right away.  She had side effects with Topamax.  She has not been on [name].  She has not been on one of the injectable preventatives.  Her primary care had done some blood work in December.  Patient reports recent fluctuation in her weight, some weight gain.  She tries to hydrate well, estimates that she gets between 48 to 56 ounces of water per day.  She does not drink caffeine daily.  She takes her supplement from her bariatric surgeon.  She tries to get about 8 hours of sleep, generally in bed between 8 and 9 and rise time around  5:30 AM.  She has occasional morning headaches but typically she had gone to bed with a headache at that time.  She has no nightly nocturia.  She does not report any snoring, she has never had a sleep study.  She denies any sudden onset one-sided weakness or numbness or tingling or droopy face or slurring of speech.  She is up-to-date with her eye exam and goes to [facility] to an optometrist and had a full, dilated eye exam last year in March and she is due for this March.  She wears contact lenses and glasses.  Headaches often start one-sided but become bilateral.  She has associated nausea but no vomiting and she has associated light sensitivity.  Headache frequency is erratic.  She had no significant migraines in November or December.  She had migraine headaches in October.  They may last up to 2 weeks at a time.  She had more consistent migraines in the month of June and July and August last year.  Previously (copied from previous notes for reference):    09/23/2023 (Dr. Ocie Doyne): <<43 year old female with a history of hypothyroidism who follows in clinic for migraine with aura. Brain MRI 07/22/22 showed mild nonspecific white matter changes and was otherwise unremarkable.    At her last visit she was continued on Topamax for prevention and Imitrex was increased to 100  mg for rescue.   Interval History: Headaches were relatively well-controlled until July. Since then she has had headaches nearly every day. She has also started to notice word finding difficulty and trouble finishing her sentences while on Topamax. She has not noticed much of a difference with 100 mg Imitrex dose, but notes it does make her sleepy.    She also notes trouble sleeping. Will sleep until about 4 am then has difficulty falling back asleep.   Migraine days per month: 30 Headache free days per month: 0   Current Headache Regimen: Preventative: Topamax 100 mg QHS Abortive: Imitrex 100 mg PRN     Prior Therapies                                   Rescue: Maxalt - lack of efficacy Imitrex 100 mg PRN - lack of efficacy, drowsy Excedrin Aleve Tylenol Zofran 4 mg PRN   Prevention: Topamax 100 mg at bedtime - word finding difficulty Gabapentin 200 mg BID >>  02/16/2023 (Dr. Ocie Doyne): <<43 year old female with a history of hypothyroidism who follows in clinic for migraine with aura.   At her last visit, brain MRI was ordered. She was counseled on medication overuse headache. Topamax was started for prevention and Imitrex was started for rescue.   Interval History: Headaches were well-controlled until 2 weeks ago. She recently went on vacation and headache began around the time she returned. She has had a constant headache for ~2 weeks. She does note increased stress at work and is actively looking for another job. She is tolerating Topamax 100 mg QHS well without side effects. Imitrex 50 mg PRN has not been effective. She has been taking OTCs as needed, but these have also not been helpful.   Brain MRI 07/22/22 showed mild nonspecific white matter changes and was otherwise unremarkable.     Migraine days per month: 14 Headache free days per month: 16   Current Headache Regimen: Preventative: Topamax 100 mg QHS Abortive: Imitrex 50 mg PRN     Prior Therapies                                  Rescue: Maxalt - lack of efficacy Imitrex 50 mg PRN - lack of efficacy Excedrin Aleve Tylenol Zofran 4 mg PRN   Prevention: Topamax 100 mg QHS Gabapentin 200 mg BID >>  Her Past Medical History Is Significant For: Past Medical History:  Diagnosis Date   Arthritis    Constipation    Graves disease    Hyperlipidemia    Hypothyroidism    IBS (irritable bowel syndrome)    Migraine    Pre-diabetes    Thyroid disease    Vitamin D deficiency     Her Past Surgical History Is Significant For: Past Surgical History:  Procedure Laterality Date   BIOPSY  10/24/2022   Procedure: BIOPSY;   Surgeon: Quentin Ore, MD;  Location: [facility];  Service: General;;   CESAREAN SECTION     COLONOSCOPY     DILATION AND EVACUATION     ESOPHAGOGASTRODUODENOSCOPY N/A 10/24/2022   Procedure: ESOPHAGOGASTRODUODENOSCOPY (EGD);  Surgeon: Quentin Ore, MD;  Location: [facility];  Service: General;  Laterality: N/A;   UPPER GI ENDOSCOPY N/A 12/15/2022   Procedure: UPPER GI ENDOSCOPY;  Surgeon: Quentin Ore, MD;  Location: [facility];  Service: General;  Laterality: N/A;    Her Family History Is Significant For: Family History  Problem Relation Age of Onset   Diabetes Mother    Cancer Mother        MMT Cancer   Obesity Mother    Throat cancer Father    Sudden death Father    Stroke Father    Migraines Sister    Healthy Sister    Healthy Sister    Healthy Brother    Healthy Brother    Healthy Brother    Healthy Son     Her Social History Is Significant For: Social History   Socioeconomic History   Marital status: Married    Spouse name: Aurther Loft   Number of children: 1   Years of education: Not on file   Highest education level: Bachelor's degree (e.g., BA, AB, BS)  Occupational History   Occupation: Doctor, general practice: [employer]  Tobacco Use   Smoking status: Never   Smokeless tobacco: Never  Vaping Use   Vaping status: Never Used  Substance and Sexual Activity   Alcohol use: Yes    Comment: occasional   Drug use: No   Sexual activity: Yes  Other Topics Concern   Not on file  Social History Narrative   Lives with family   Caffeine- none   Social Drivers of Health   Financial Resource Strain: Not on file  Food Insecurity: No Food Insecurity (12/15/2022)   Hunger Vital Sign    Worried About Running Out of Food in the Last Year: Never true    Ran Out of Food in the Last Year: Never true  Transportation Needs: No Transportation Needs (12/15/2022)   PRAPARE - Administrator, Civil Service (Medical): No    Lack of  Transportation (Non-Medical): No  Physical Activity: Not on file  Stress: Not on file  Social Connections: Not on file    Her Allergies Are:  Allergies  Allergen Reactions   Simvastatin Other (See Comments)    fatigue   Tetanus-Diphth-Acell Pertussis Swelling  :   Her Current Medications Are:  Outpatient Encounter Medications as of 01/28/2024  Medication Sig   levothyroxine (SYNTHROID) 125 MCG tablet Take 1 tablet (125 mcg total) by mouth in the morning on an empty stomach   linaclotide (LINZESS) 290 MCG CAPS capsule Take 1 capsule (290 mcg) by mouth daily before breakfast.   propranolol (INDERAL) 20 MG tablet Take 1 tablet (20 mg total) by mouth 2 (two) times daily.   Rimegepant Sulfate (NURTEC) 75 MG TBDP Take 1 tablet by mouth daily as needed. Max dose in 24 hours is 1 tablet.   Vitamin D, Ergocalciferol, (DRISDOL) 1.25 MG (50000 UNIT) CAPS capsule Take 1 capsule by mouth once a week.   [DISCONTINUED] bisacodyl 5 MG EC tablet Take 5 mg by mouth at bedtime.   [DISCONTINUED] nortriptyline (PAMELOR) 25 MG capsule Take 1 capsule (25 mg total) by mouth at bedtime.   [DISCONTINUED] SUMAtriptan (IMITREX) 100 MG tablet Take 1 tablet by mouth as needed for migraine. May repeat in 2 hours if headache persists or recurs. Max dose 2 tablets in 24 hours   No facility-administered encounter medications on file as of 01/28/2024.  :   Review of Systems:  Out of a complete 14 point review of systems, all are reviewed and negative with the exception of these symptoms as listed below:   Review of Systems  Neurological:  Former [place] Pt. Migraines severity worse.  Had migraine since Saturday level 8-9.  Toradol does not help after the 1st day of migraine (have to catch from the start).  Nortriptyline, topamax  and imitrex not helping.  Nurtec does not seem to help.  Has vertigo, nausea, word finding issues with migraines.  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  With new boss coming  on board (fmla recommended).     Objective:  Neurological Exam  Physical Exam Physical Examination:   Vitals:   01/28/24 1449  BP: 118/68  Pulse: 72  SpO2: 99%    General Examination: The patient is a very pleasant 43 y.o. female in no acute distress. She appears well-developed and well-nourished and well groomed.   HEENT: Normocephalic, atraumatic, pupils are equal, round and reactive to light, no photophobia.  Extraocular tracking is good without limitation to gaze excursion or nystagmus noted. Hearing is grossly intact. Face is symmetric with normal facial animation. Speech is clear with no dysarthria noted. There is no hypophonia. There is no lip, neck/head, jaw or voice tremor. Neck is supple with full range of passive and active motion. There are no carotid bruits on auscultation. Oropharynx exam reveals: mild mouth dryness, good dental hygiene and mild airway crowding, due to small airway entry and slightly elongated uvula.  Tonsils small.  Tongue protrudes centrally and palate elevates symmetrically.  Chest: Clear to auscultation without wheezing, rhonchi or crackles noted.  Heart: S1+S2+0, regular and normal without murmurs, rubs or gallops noted.   Abdomen: Soft, non-tender and non-distended.  Extremities: There is no pitting edema in the distal lower extremities bilaterally.   Skin: Warm and dry without trophic changes noted.   Musculoskeletal: exam reveals no obvious joint deformities.   Neurologically:  Mental status: The patient is awake, alert and oriented in all 4 spheres. Her immediate and remote memory, attention, language skills and fund of knowledge are appropriate. There is no evidence of aphasia, agnosia, apraxia or anomia. Speech is clear with normal prosody and enunciation. Thought process is linear. Mood is normal and affect is normal.  Cranial nerves II - XII are as described above under HEENT exam.  Motor exam: Normal bulk, strength and tone is noted.  There is no obvious action or resting tremor.  No drift or rebound.  No postural or intention tremor. Fine motor skills and coordination: grossly intact.  Cerebellar testing: No dysmetria or intention tremor. There is no truncal or gait ataxia.  Normal finger-to-nose, normal heel-to-shin bilaterally. Reflexes 1+ throughout, toes are downgoing bilaterally. Romberg negative. Sensory exam: intact to light touch in the upper and lower extremities.  Gait, station and balance: She stands easily. No veering to one side is noted. No leaning to one side is noted. Posture is age-appropriate and stance is narrow based. Gait shows normal stride length and normal pace. No problems turning are noted.  Normal tandem walk.  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  She has no alarming features on exam or by history.  She had a brain MRI with and without contrast in July 2023 which showed some white matter spots which could be related  to having a longstanding history of migraines.  Her migraines started when she had her son who is now 45.  She was also told by her GYN that she may be perimenopausal at this time.  Hormonal fluctuations certainly may play a role.  We talked about common migraine triggers today.  She is advised to increase her propranolol to 20 mg 3 times daily and continue with Nurtec as needed.  We will try to get her Ajovy monthly injections for prevention.  Alternatively, we may consider Bennie Pierini which is good for episodic migraine prevention.  Her neurological exam is nonfocal.  She  is up-to-date with her eye exam and has a pending appointment for March 2025.  She is reminded to stay well-hydrated and well rested.  We will go ahead with a home sleep test to investigate her for obstructive sleep apnea.  If she has OSA, I will likely recommend treatment with an AutoPap machine.  We will plan to follow-up in this clinic in about 4 months.  I answered all her questions today and she was in agreement.   I spent 45 minutes in total face-to-face time and in reviewing records during pre-charting, more than 50% of which was spent in counseling and coordination of care, reviewing test results, reviewing medications and treatment regimen and/or in discussing or reviewing the diagnosis of episodic migraine, the prognosis and treatment options. Pertinent laboratory and imaging test results that were available during this visit with the patient were reviewed by me and considered in my medical decision making (see chart for details).

## 2024-01-28 NOTE — Patient Instructions (Signed)
It was nice to meet you today.  We will increase your propranolol to 20 mg 3 times daily.  You can continue with Nurtec as needed but you have to take it at the onset of a migraine for it to be effective.  We we will start the process of approval for Ajovy once a month injections.  Alternatively, we can consider any oral preventative called Bennie Pierini which is taken daily.  You have tried a failed multiple abortive and preventative treatments for your migraines.  Once you get the prescription for Ajovy, start Ajovy 225 mg/1.5 ml, it is 1 injection subcutaneously every 30 days.  Potential side effects include: Palpitations (as in fast heartbeat), hives, itching, rash, hoarseness, irritation at the injection site, joint pain and stiffness or joint swelling, swelling of the eyelids, face, lips, hands or feet, chest tightness, trouble breathing or swallowing.  Some side effects are mild and go away as your body gets used to the new medication.    If you have any serious side effects such as bleeding or blistering or discoloration of your skin, hives, significant itching or a rash, please seek immediate medical attention by going to the ER or calling 911.   Injection site reactions include itching, redness, pain at the injection site and Rare side effects include: Anaphylaxis (a severe allergic reaction), angioedema (severe swelling including around mouth and tongue).  I will order a home sleep test to investigate if you have obstructive sleep apnea.  If you do have obstructive sleep apnea, I will likely recommend treatment with an AutoPap machine.

## 2024-01-29 ENCOUNTER — Other Ambulatory Visit (HOSPITAL_COMMUNITY): Payer: Self-pay

## 2024-01-29 ENCOUNTER — Other Ambulatory Visit: Payer: Self-pay | Admitting: Neurology

## 2024-01-29 ENCOUNTER — Other Ambulatory Visit: Payer: Self-pay

## 2024-01-29 MED ORDER — NURTEC 75 MG PO TBDP
ORAL_TABLET | ORAL | 0 refills | Status: DC
  Filled 2024-01-29: qty 8, 30d supply, fill #0

## 2024-02-04 ENCOUNTER — Ambulatory Visit (INDEPENDENT_AMBULATORY_CARE_PROVIDER_SITE_OTHER): Payer: Commercial Managed Care - PPO | Admitting: Neurology

## 2024-02-04 DIAGNOSIS — R519 Headache, unspecified: Secondary | ICD-10-CM

## 2024-02-04 DIAGNOSIS — G43909 Migraine, unspecified, not intractable, without status migrainosus: Secondary | ICD-10-CM

## 2024-02-04 DIAGNOSIS — Z9884 Bariatric surgery status: Secondary | ICD-10-CM

## 2024-02-04 DIAGNOSIS — E66811 Obesity, class 1: Secondary | ICD-10-CM

## 2024-02-04 DIAGNOSIS — R635 Abnormal weight gain: Secondary | ICD-10-CM

## 2024-02-04 DIAGNOSIS — G4733 Obstructive sleep apnea (adult) (pediatric): Secondary | ICD-10-CM | POA: Diagnosis not present

## 2024-02-05 ENCOUNTER — Other Ambulatory Visit (HOSPITAL_COMMUNITY): Payer: Self-pay

## 2024-02-08 ENCOUNTER — Encounter (HOSPITAL_COMMUNITY): Payer: Self-pay

## 2024-02-08 ENCOUNTER — Encounter: Payer: Self-pay | Admitting: Neurology

## 2024-02-08 ENCOUNTER — Other Ambulatory Visit (HOSPITAL_COMMUNITY): Payer: Self-pay

## 2024-02-08 ENCOUNTER — Telehealth: Payer: Self-pay | Admitting: *Deleted

## 2024-02-08 NOTE — Telephone Encounter (Signed)
 Asking for a PA on ajovy .

## 2024-02-09 NOTE — Telephone Encounter (Signed)
Sandy sent pt a separate [employer].

## 2024-02-10 ENCOUNTER — Telehealth: Payer: Self-pay

## 2024-02-10 ENCOUNTER — Other Ambulatory Visit (HOSPITAL_COMMUNITY): Payer: Self-pay

## 2024-02-10 NOTE — Telephone Encounter (Signed)
PA request has been Submitted. New Encounter created for follow up. For additional info see Pharmacy Prior Auth telephone encounter from 02/12.

## 2024-02-10 NOTE — Telephone Encounter (Signed)
*  [facility]  Pharmacy Patient Advocate Encounter  Received notification from [facility] that Prior Authorization for AJOVY (fremanezumab-vfrm) injection 225MG /1.5ML auto-injectors  has been APPROVED from 02/10/2024 to 08/08/2024. Ran test claim, Copay is $24.98. This test claim was processed through [facility]- copay amounts may vary at other pharmacies due to pharmacy/plan contracts, or as the patient moves through the different stages of their insurance plan.   PA #/Case ID/Reference #: [id number]

## 2024-02-11 ENCOUNTER — Other Ambulatory Visit (HOSPITAL_COMMUNITY): Payer: Self-pay

## 2024-02-11 NOTE — Telephone Encounter (Signed)
PA not needed for Nurtec. Processed and being filled.

## 2024-02-11 NOTE — Telephone Encounter (Signed)
I called pt and let her know the ajovy was approved.  She has not been able to get the nurtec.  There is an approval from 10/2023 till 04/2024.  She may need a new PA being beginning of year.  Will let our PA team know.

## 2024-02-23 ENCOUNTER — Encounter: Payer: Self-pay | Admitting: Neurology

## 2024-02-24 NOTE — Progress Notes (Signed)
 See procedure note.

## 2024-02-25 NOTE — Addendum Note (Signed)
 Addended by: Huston Foley on: 02/25/2024 12:42 PM   Modules accepted: Orders

## 2024-02-25 NOTE — Procedures (Signed)
 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  BMI (at the time of sleep clinic visit and/or test date): 32.5 kg/m  FINDINGS:   Study Protocol:    The SANSA single-point-of-skin-contact chest-worn sensor - an FDA cleared and DOT approved type 4 home sleep test device - measures eight physiological channels,  including blood oxygen saturation (measured via PPG [photoplethysmography]), EKG-derived heart rate, respiratory effort, chest movement (measured via accelerometer), snoring, body position, and actigraphy. The device is designed to be worn for up to 10 hours per study.   Sleep Summary:   Total Recording Time (hours, min): 9 hours, 27 min  Total Effective Sleep Time (hours, min):  4 hours, 43 min  Sleep Efficiency (%):    75%   Respiratory Indices:   Calculated sAHI (per hour):  17.6/hour         Oxygen Saturation Statistics:    Oxygen Saturation (%) Mean: 93.5%   Minimum oxygen saturation (%):                 76.1%   O2 Saturation Range (%): 76.1 - 99.6%   Time below or at 88% saturation: 7 min   Pulse Rate Statistics:   Pulse Mean (bpm):    62/min    Pulse Range (51 - 101/min)   Snoring: intermittent, mild to modera   IMPRESSION/DIAGNOSES:   OSA (obstructive sleep apnea), moderate    RECOMMENDATIONS:   This home sleep test demonstrates moderate obstructive sleep apnea with a total AHI of 17.6/hour and O2 nadir of 76.1%. Intermittent mild to moderate snoring was detected.  Treatment with a positive airway pressure (PAP) device is recommended. The patient will be advised to proceed with an autoPAP titration/trial at home for now. A full night titration study may be considered to optimize treatment settings, monitor proper oxygen saturations and aid with improvement of tolerance and adherence, if needed down the road. Alternative treatment options may include a dental device through dentistry or orthodontics in selected patients or Inspire (hypoglossal nerve stimulator) in carefully selected patients (meeting inclusion criteria).  Concomitant weight loss is recommended (where clinically appropriate). Please note that untreated obstructive sleep apnea may carry additional perioperative morbidity. Patients with significant obstructive sleep apnea should receive perioperative PAP therapy and the surgeons and particularly the anesthesiologist should be informed of the diagnosis and the severity of the sleep disordered breathing. The patient should be cautioned not to drive, work at heights, or operate dangerous or heavy equipment when tired or sleepy. Review and reiteration of good sleep hygiene measures should be pursued with any patient. Other causes of the patient's symptoms, including circadian rhythm disturbances, an underlying mood disorder, medication effect and/or an underlying medical problem cannot be ruled out based on this test. Clinical correlation is recommended.  The patient and her referring provider will be notified of the test results. The patient will be seen in follow up in sleep clinic at [facility].  I certify that I have reviewed the raw data recording prior to the issuance of this report in accordance with the standards of the American Academy of Sleep Medicine (AASM).  INTERPRETING PHYSICIAN:   True Mar, MD, PhD Medical Director, [facility]) Diplomat, ABPN (Neurology and Sleep)   [facility] [address] [place], [place] [address] [phone]

## 2024-02-29 ENCOUNTER — Encounter: Payer: Self-pay | Admitting: Neurology

## 2024-03-01 ENCOUNTER — Other Ambulatory Visit (HOSPITAL_COMMUNITY): Payer: Self-pay

## 2024-03-01 MED ORDER — RIZATRIPTAN BENZOATE 5 MG PO TABS
5.0000 mg | ORAL_TABLET | ORAL | 0 refills | Status: DC | PRN
  Filled 2024-03-01: qty 10, 30d supply, fill #0

## 2024-03-01 NOTE — Telephone Encounter (Signed)
 Maxalt Rx sent to pharmacy. May take Nurtec OR Maxalt, not to be combined with each other.

## 2024-03-02 ENCOUNTER — Encounter: Payer: Self-pay | Admitting: Neurology

## 2024-03-02 ENCOUNTER — Telehealth: Payer: Self-pay | Admitting: *Deleted

## 2024-03-02 ENCOUNTER — Other Ambulatory Visit (HOSPITAL_COMMUNITY): Payer: Self-pay

## 2024-03-02 NOTE — Telephone Encounter (Signed)
-----   Message from Huston Foley sent at 02/25/2024 12:42 PM EST ----- Patient seen for HA FU recently. Had HST via Sansa on 02/08/24.  Please call and notify the patient that the recent home sleep test showed obstructive sleep apnea in the moderate range. I recommend treatment in the form of autoPAP, which means, that we don't have to bring her in for a sleep study with CPAP, but will let her start using a so called autoPAP machine at home, which is a CPAP-like machine with self-adjusting pressures. We will send the order to a local DME company (of her choice, or as per insurance requirement). The DME representative will fit her with a mask, educate her on how to use the machine, how to put the mask on, etc. I have placed an order in the chart. Please send the order, talk to patient, send report to referring MD. We will need a FU in sleep clinic for 10 weeks post-PAP set up, please arrange that with me or one of our NPs. Also reinforce the need for compliance with treatment. Thanks,   Huston Foley, MD, PhD [facility])

## 2024-03-22 ENCOUNTER — Ambulatory Visit: Payer: Commercial Managed Care - PPO | Admitting: Neurology

## 2024-04-06 ENCOUNTER — Other Ambulatory Visit: Payer: Self-pay

## 2024-04-06 ENCOUNTER — Other Ambulatory Visit: Payer: Self-pay | Admitting: Neurology

## 2024-04-06 ENCOUNTER — Other Ambulatory Visit (HOSPITAL_COMMUNITY): Payer: Self-pay

## 2024-04-06 MED ORDER — RIZATRIPTAN BENZOATE 5 MG PO TABS
5.0000 mg | ORAL_TABLET | ORAL | 0 refills | Status: DC | PRN
  Filled 2024-04-06: qty 10, 30d supply, fill #0

## 2024-05-06 ENCOUNTER — Other Ambulatory Visit: Payer: Self-pay

## 2024-05-09 ENCOUNTER — Other Ambulatory Visit: Payer: Self-pay

## 2024-05-09 ENCOUNTER — Other Ambulatory Visit (HOSPITAL_COMMUNITY): Payer: Self-pay

## 2024-05-10 ENCOUNTER — Encounter: Payer: Self-pay | Admitting: Neurology

## 2024-05-10 ENCOUNTER — Ambulatory Visit: Payer: Commercial Managed Care - PPO | Admitting: Neurology

## 2024-05-10 ENCOUNTER — Other Ambulatory Visit (HOSPITAL_COMMUNITY): Payer: Self-pay

## 2024-05-10 VITALS — BP 115/80 | HR 70 | Ht 63.0 in | Wt 198.0 lb

## 2024-05-10 DIAGNOSIS — G43909 Migraine, unspecified, not intractable, without status migrainosus: Secondary | ICD-10-CM | POA: Diagnosis not present

## 2024-05-10 DIAGNOSIS — G4733 Obstructive sleep apnea (adult) (pediatric): Secondary | ICD-10-CM

## 2024-05-10 MED ORDER — NURTEC 75 MG PO TBDP
ORAL_TABLET | ORAL | 2 refills | Status: DC
  Filled 2024-05-10: qty 8, fill #0
  Filled 2024-05-30: qty 8, 30d supply, fill #0

## 2024-05-10 MED ORDER — AJOVY 225 MG/1.5ML ~~LOC~~ SOAJ
225.0000 mg | SUBCUTANEOUS | 3 refills | Status: AC
Start: 2024-05-10 — End: ?
  Filled 2024-05-10 – 2024-06-06 (×2): qty 1.5, 30d supply, fill #0

## 2024-05-10 MED ORDER — RIZATRIPTAN BENZOATE 5 MG PO TABS
5.0000 mg | ORAL_TABLET | ORAL | 2 refills | Status: DC | PRN
  Filled 2024-05-10: qty 10, 30d supply, fill #0

## 2024-05-10 NOTE — Patient Instructions (Signed)
 You can continue with Ajovy  monthly injections and propranolol  20 mg 3 times daily.  I would like for you to start AutoPap therapy for treatment of your moderate obstructive sleep apnea.  Moderate and severe obstructive sleep apnea can increase your risk for cardiovascular complications, as discussed.  I would be happy to make a referral to dentistry if need be for consideration of an oral appliance.  You can continue to use Nurtec as needed and Maxalt  as needed so long as you do not combine them within 24 hours of each other.   Nurtec and Ajovy  are not considered safe during pregnancy.

## 2024-05-10 NOTE — Progress Notes (Signed)
 Subjective:    Patient ID: Susan Shields is a 43 y.o. female.  HPI    Interim history:    Susan Shields is a 43 year old female with an underlying medical history of hyperlipidemia, Graves' disease, hypothyroidism, irritable bowel syndrome, prediabetes, vitamin D  deficiency, arthritis, obesity, with status post sleeve gastrectomy in December 2023, and OSA, who presents for follow-up consultation of her migraine headaches and sleep apnea.  The patient is unaccompanied today.  I first met her in January 2025, at which time she had transition from previously seen Dr. Billy Shields in this office. She was advised to start Ajovy  monthly injections for migraine prevention and continue with Nurtec as needed.  She was advised to increase her propranolol  to 20 mg 3 times daily for additional help with headache prevention. She was advised to proceed with a sleep study to evaluate for sleep apnea.   She had a home sleep test through our office on 02/08/2024 which showed moderate obstructive sleep apnea with a total AHI of 17.6/hour and O2 nadir of 76.1%. Intermittent mild to moderate snoring was detected.  She was advised to proceed with home AutoPap therapy.  She did not start home AutoPap therapy and wanted to think about it.   Today, 05/10/2024: She reports feeling better since starting the Ajovy , she has had 3 inj thus far, and due for her fourth injection tomorrow.  She does take Maxalt  as needed and also uses Nurtec as needed, knows not to combine both medications but takes the Maxalt  when she knows that she can go to sleep as it is sedating for her and she takes the Nurtec when she is out and about.  She has not further thought about treating her sleep but is willing to think about it, we talked about the importance of treating moderate and severe obstructive sleep apnea with respect to potential cardiovascular complications down the road, not just for symptom control.  She tries to hydrate well with water , she limits  her caffeine, does not drink any daily caffeine.  She is up-to-date with her eye examination.   The patient's allergies, current medications, family history, past medical history, past social history, past surgical history and problem list were reviewed and updated as appropriate.     Previously (copied from previous notes for reference):      01/28/2024: She previously followed in this office with Dr. Dala Shields and was last seen by her on 09/23/2023.  I reviewed the note and prior note and copy the notes below for reference.   She reports that she started having a migraine this past weekend.  She took her Nurtec about a day later but it did not seem effective.  She realizes that she may have taken the medicine too late.  She was not sure if this was going to be a migraine headache so she did not take it right away.  She was started on propranolol  by Dr. Samara Shields in this office on the call at 20 mg twice daily.  She continues to take it.  She does not know if it is effective.  She stopped taking the Pamelor  back in November, she felt it made her constipated.  She is no longer on sumatriptan  hand, she did not think it helped but again, she may not have always taken it right away.  She had side effects with Topamax .  She has not been on Susan Shields.  She has not been on one of the injectable preventatives.  Her primary care  had done some blood work in December.  Patient reports recent fluctuation in her weight, some weight gain.  She tries to hydrate well, estimates that she gets between 48 to 56 ounces of water  per day.  She does not drink caffeine daily.  She takes her supplement from her bariatric surgeon.  She tries to get about 8 hours of sleep, generally in bed between 8 and 9 and rise time around 5:30 AM.  She has occasional morning headaches but typically she had gone to bed with a headache at that time.  She has no nightly nocturia.  She does not report any snoring, she has never had a sleep study.   She denies any sudden onset one-sided weakness or numbness or tingling or droopy face or slurring of speech.  She is up-to-date with her eye exam and goes to [facility] to an optometrist and had a full, dilated eye exam last year in March and she is due for this March.  She wears contact lenses and glasses.  Headaches often start one-sided but become bilateral.  She has associated nausea but no vomiting and she has associated light sensitivity.  Headache frequency is erratic.  She had no significant migraines in November or December.  She had migraine headaches in October.  They may last up to 2 weeks at a time.  She had more consistent migraines in the month of June and July and August last year.    09/23/2023 (Dr. Dala Shields): <<43 year old female with a history of hypothyroidism who follows in clinic for migraine with aura. Brain MRI 07/22/22 showed mild nonspecific white matter changes and was otherwise unremarkable.    At her last visit she was continued on Topamax  for prevention and Imitrex  was increased to 100 mg for rescue.   Interval History: Headaches were relatively well-controlled until July. Since then she has had headaches nearly every day. She has also started to notice word finding difficulty and trouble finishing her sentences while on Topamax . She has not noticed much of a difference with 100 mg Imitrex  dose, but notes it does make her sleepy.    She also notes trouble sleeping. Will sleep until about 4 am then has difficulty falling back asleep.   Migraine days per month: 30 Headache free days per month: 0   Current Headache Regimen: Preventative: Topamax  100 mg QHS Abortive: Imitrex  100 mg PRN     Prior Therapies                                  Rescue: Maxalt  - lack of efficacy Imitrex  100 mg PRN - lack of efficacy, drowsy Excedrin Aleve Tylenol  Zofran  4 mg PRN   Prevention: Topamax  100 mg at bedtime - word finding difficulty Gabapentin  200 mg BID >>    02/16/2023 (Dr. Dala Shields): <<43 year old female with a history of hypothyroidism who follows in clinic for migraine with aura.   At her last visit, brain MRI was ordered. She was counseled on medication overuse headache. Topamax  was started for prevention and Imitrex  was started for rescue.   Interval History: Headaches were well-controlled until 2 weeks ago. She recently went on vacation and headache began around the time she returned. She has had a constant headache for ~2 weeks. She does note increased stress at work and is actively looking for another job. She is tolerating Topamax  100 mg QHS well without side effects. Imitrex   50 mg PRN has not been effective. She has been taking OTCs as needed, but these have also not been helpful.   Brain MRI 07/22/22 showed mild nonspecific white matter changes and was otherwise unremarkable.     Migraine days per month: 14 Headache free days per month: 16   Current Headache Regimen: Preventative: Topamax  100 mg QHS Abortive: Imitrex  50 mg PRN     Prior Therapies                                  Rescue: Maxalt  - lack of efficacy Imitrex  50 mg PRN - lack of efficacy Excedrin Aleve Tylenol  Zofran  4 mg PRN   Prevention: Topamax  100 mg QHS Gabapentin  200 mg BID >>   Her Past Medical History Is Significant For: Past Medical History:  Diagnosis Date   Arthritis    Constipation    Graves disease    Hyperlipidemia    Hypothyroidism    IBS (irritable bowel syndrome)    Migraine    Pre-diabetes    Thyroid  disease    Vitamin D  deficiency     Her Past Surgical History Is Significant For: Past Surgical History:  Procedure Laterality Date   BIOPSY  10/24/2022   Procedure: BIOPSY;  Surgeon: Junie Olds, MD;  Location: [facility];  Service: General;;   CESAREAN SECTION     COLONOSCOPY     DILATION AND EVACUATION     ESOPHAGOGASTRODUODENOSCOPY N/A 10/24/2022   Procedure: ESOPHAGOGASTRODUODENOSCOPY (EGD);  Surgeon:  Junie Olds, MD;  Location: [facility];  Service: General;  Laterality: N/A;   UPPER GI ENDOSCOPY N/A 12/15/2022   Procedure: UPPER GI ENDOSCOPY;  Surgeon: Junie Olds, MD;  Location: [facility];  Service: General;  Laterality: N/A;    Her Family History Is Significant For: Family History  Problem Relation Age of Onset   Diabetes Mother    Cancer Mother        MMT Cancer   Obesity Mother    Throat cancer Father    Sudden death Father    Stroke Father    Migraines Sister    Healthy Sister    Healthy Sister    Healthy Brother    Healthy Brother    Healthy Brother    Healthy Son     Her Social History Is Significant For: Social History   Socioeconomic History   Marital status: Married    Spouse name: Blaise Bumps   Number of children: 1   Years of education: Not on file   Highest education level: Bachelor's degree (e.g., BA, AB, BS)  Occupational History   Occupation: Doctor, general practice: [employer]  Tobacco Use   Smoking status: Never   Smokeless tobacco: Never  Vaping Use   Vaping status: Never Used  Substance and Sexual Activity   Alcohol use: Yes    Comment: occasional   Drug use: No   Sexual activity: Yes  Other Topics Concern   Not on file  Social History Narrative   Lives with family   Caffeine- none   Social Drivers of Health   Financial Resource Strain: Not on file  Food Insecurity: No Food Insecurity (12/15/2022)   Hunger Vital Sign    Worried About Running Out of Food in the Last Year: Never true    Ran Out of Food in the Last Year: Never true  Transportation Needs: No Transportation Needs (12/15/2022)  PRAPARE - Administrator, Civil Service (Medical): No    Lack of Transportation (Non-Medical): No  Physical Activity: Not on file  Stress: Not on file  Social Connections: Not on file    Her Allergies Are:  Allergies  Allergen Reactions   Simvastatin Other (See Comments)    fatigue   Tetanus-Diphth-Acell  Pertussis Swelling  :   Her Current Medications Are:  Outpatient Encounter Medications as of 05/10/2024  Medication Sig   Fremanezumab -vfrm (AJOVY ) 225 MG/1.5ML SOAJ Inject 225 mg into the skin every 30 (thirty) days.   levothyroxine  (SYNTHROID ) 125 MCG tablet Take 1 tablet (125 mcg total) by mouth in the morning on an empty stomach   linaclotide  (LINZESS ) 290 MCG CAPS capsule Take 1 capsule (290 mcg) by mouth daily before breakfast.   Rimegepant Sulfate  (NURTEC) 75 MG TBDP Take 1 tablet by mouth daily as needed. Max dose in 24 hours is 1 tablet.   rizatriptan  (MAXALT ) 5 MG tablet Take 1 tablet (5 mg total) by mouth as needed for migraine. May repeat in 2 hours if needed   Vitamin D , Ergocalciferol , (DRISDOL ) 1.25 MG (50000 UNIT) CAPS capsule Take 1 capsule by mouth once a week.   propranolol  (INDERAL ) 20 MG tablet Take 1 tablet (20 mg total) by mouth 3 (three) times daily.   No facility-administered encounter medications on file as of 05/10/2024.  :  Review of Systems:  Out of a complete 14 point review of systems, all are reviewed and negative with the exception of these symptoms as listed below:  Review of Systems  Neurological:        Patient is here alone for migraine follow-up. Last month she had two headaches and the first lasted two days. She states she has one now that started on Mother's Day. She states she was told that when it gets closer to the next injection her migraines can start coming back. Her next Ajovy  injection is due tomorrow. She likes Maxalt  but can't take it at work because it makes her sleepy. Nurtec works but she felt like she needed something stronger.     Objective:  Neurological Exam  Physical Exam Physical Examination:   Vitals:   05/10/24 1319  BP: 115/80  Pulse: 70    General Examination: The patient is a very pleasant 43 y.o. female in no acute distress. She appears well-developed and well-nourished and well groomed.   HEENT: Normocephalic,  atraumatic, pupils are equal, round and reactive to light, no photophobia.  Wearing her tinted eyeglasses.  Extraocular tracking is good without limitation to gaze excursion or nystagmus noted. Hearing is grossly intact. Face is symmetric with normal facial animation. Speech is clear with no dysarthria noted. There is no hypophonia. There is no lip, neck/head, jaw or voice tremor. Neck is supple with full range of passive and active motion. There are no carotid bruits on auscultation. Oropharynx exam reveals: mild mouth dryness, good dental hygiene and mild airway crowding, overall stable findings.     Chest: Clear to auscultation without wheezing, rhonchi or crackles noted.   Heart: S1+S2+0, regular and normal without murmurs, rubs or gallops noted.    Abdomen: Soft, non-tender and non-distended.   Extremities: There is no obvious swelling in the distal lower extremities bilaterally.    Skin: Warm and dry without trophic changes noted.    Musculoskeletal: exam reveals no obvious joint deformities.    Neurologically:  Mental status: The patient is awake, alert and oriented in all 4  spheres. Her immediate and remote memory, attention, language skills and fund of knowledge are appropriate. There is no evidence of aphasia, agnosia, apraxia or anomia. Speech is clear with normal prosody and enunciation. Thought process is linear. Mood is normal and affect is normal.  Cranial nerves II - XII are as described above under HEENT exam.  Motor exam: Normal bulk, upper extremities without restriction, no obvious action or resting tremor.    Fine motor skills and coordination: grossly intact.  Cerebellar testing: No dysmetria or intention tremor. There is no truncal or gait ataxia.  Normal finger-to-nose, normal heel-to-shin bilaterally. Romberg negative. Sensory exam: intact to light touch in the upper and lower extremities.  Gait, station and balance: She stands easily. No veering to one side is noted.  No leaning to one side is noted. Posture is age-appropriate and stance is narrow based. Gait shows normal stride length and normal pace. No problems turning are noted.  Normal tandem walk.   Assessment and plan:  In summary, Susan Shields is a 43 year old female with an underlying medical history of hyperlipidemia, Graves' disease, hypothyroidism, irritable bowel syndrome, prediabetes, vitamin D  deficiency, arthritis, obesity, with status post sleeve gastrectomy in December 2023, and OSA, who presents for follow-up consultation of her migraine headaches and sleep apnea.  She started Ajovy  injections about 3 months ago.  She is doing well.  For as needed use she has Nurtec as needed and Maxalt  as needed and is again advised not to take them together but she has needed independently.  She is strongly advised to consider treatment for her moderate obstructive sleep apnea.  Of note, her home sleep test from 02/08/2024 showed moderate obstructive sleep apnea with a total AHI of 17.6/hour and O2 nadir of 76.1%.  We talked about potential alternative treatment options including a dental device, she is going to think about it and if she AutoPap therapy at home.  She is advised that I would be happy to make a referral to dentistry for consideration of an oral appliance.  Encouraged to continue work on weight loss.  She is advised to continue with Ajovy  monthly injections for migraine prevention, continue with Maxalt  as needed and Nurtec as needed.  If need be, for future consideration, we may consider Qulipta.  Her neurological exam is nonfocal.  She is up-to-date with her eye exam.  She is reminded to stay well-hydrated and well rested.  Advised to follow-up routinely in this clinic to see one of our nurse practitioners in 6 months, sooner if needed.  She is also on propranolol  20 mg 3 times daily.  I answered all her questions today and she was in agreement with our plan. I spent 30 minutes in total face-to-face time  and in reviewing records during pre-charting, more than 50% of which was spent in counseling and coordination of care, reviewing test results, reviewing medications and treatment regimen and/or in discussing or reviewing the diagnosis of episodic migraine, obstructive sleep apnea, the prognosis and treatment options. Pertinent laboratory and imaging test results that were available during this visit with the patient were reviewed by me and considered in my medical decision making (see chart for details).

## 2024-05-11 ENCOUNTER — Other Ambulatory Visit (HOSPITAL_COMMUNITY): Payer: Self-pay

## 2024-05-11 ENCOUNTER — Telehealth: Payer: Self-pay

## 2024-05-11 NOTE — Telephone Encounter (Signed)
 Pharmacy Patient Advocate Encounter   Received notification from CoverMyMeds that prior authorization for Nurtec 75MG  dispersible tablets is required/requested.   Insurance verification completed.   The patient is insured through [facility] .   Per test claim: PA required; PA submitted to above mentioned insurance via CoverMyMeds Key/confirmation #/EOC [id number] Status is pending

## 2024-05-11 NOTE — Telephone Encounter (Signed)
 Pharmacy Patient Advocate Encounter  Received notification from [facility] that Prior Authorization for Nurtec 75MG  dispersible tablets has been APPROVED from 05/11/2024 to 05/11/2025. Ran test claim, Copay is $0. This test claim was processed through [facility]- copay amounts may vary at other pharmacies due to pharmacy/plan contracts, or as the patient moves through the different stages of their insurance plan.   PA #/Case ID/Reference #: PA Case ID #: [id number]

## 2024-05-12 ENCOUNTER — Other Ambulatory Visit (HOSPITAL_COMMUNITY): Payer: Self-pay

## 2024-05-12 ENCOUNTER — Other Ambulatory Visit: Payer: Self-pay

## 2024-05-13 ENCOUNTER — Other Ambulatory Visit: Payer: Self-pay

## 2024-05-16 ENCOUNTER — Other Ambulatory Visit (HOSPITAL_COMMUNITY): Payer: Self-pay

## 2024-05-30 ENCOUNTER — Other Ambulatory Visit (HOSPITAL_COMMUNITY): Payer: Self-pay

## 2024-06-06 ENCOUNTER — Other Ambulatory Visit (HOSPITAL_COMMUNITY): Payer: Self-pay

## 2024-06-13 NOTE — Progress Notes (Signed)
 NEUROLOGY CONSULTATION NOTE  Susan Shields MRN: [id number] DOB: Dec 26, 1981  Referring provider: Vyvyan Sun, MD Primary care provider: Vyvyan Sun, MD  Reason for consult:  migraine  Assessment/Plan:   Migraine with aura, without status migrainosus, not intractable  Migraine prevention:  Plan to change from Ajovy  to Aimovig 140mg  every 28 days.  If no improvement in 3 months, contact me Migraine rescue:  Instead of Nurtec, she will try samples of Ubrelvy 100mg  and give me an update.  At home, she may try higher dose of Maxalt -MLT 10mg .  Zofran  ODT 4mg  for nausea. Limit use of pain relievers to no more than 9 days out of the month to prevent risk of rebound or medication-overuse headache. Keep headache diary Discussed lifestyle modification and option for vitamins/supplements Follow up 6 months.    Subjective:  Susan Shields is a 43 year old right-handed female with Graves disease s/p acquired hypothyroidism and IBS who presents for migraines.  History supplemented by prior neurologist's and referring provider's notes.  MRI of brain personally reviewed.  Onset:  2005, after giving birth to her son.  Worsened in 2023 with increased stress associated with her son going away to college. Location:  bilateral temporal, bilateral occipital Quality:  pressure, sharp in back of head Intensity:  10/10, 6-7/10 (on Ajovy ).   Aura:  occasionally black dots in her vision or fuzzy vision Prodrome:  dizziness Associated symptoms:  Nausea, photophobia, vertigo.  She denies associated vomiting, phonophobia, neck pain, unilateral numbness or weakness. Duration:  all day Frequency:  6 days a month Frequency of abortive medication: 6 days a month Triggers:  stress/anxiety Relieving factors:  sitting in dark room (can't lie down if with vertigo) Activity:  aggravates  07/22/2022 MRI BRAIN W WO:  Rare foci of T2 hyperintensity within the white matter of the cerebral hemispheres, nonspecific, may be  related to migraines given provided history. Differential diagnosis include demyelinating disease, vasculitis and sequela of prior inflammatory/infectious processes.  Past NSAIDS/analgesics:  meloxicam, Toradol , Tylenol , Aleve Past abortive triptans:  sumatriptan  tab Past abortive ergotamine:  none Past muscle relaxants:  none Past anti-emetic:  Zofran  Past antihypertensive medications:  propranolol  Past antidepressant medications:  nortriptyline  Past anticonvulsant medications:  topiramate , gabapentin  Past anti-CGRP:  none Past vitamins/Herbal/Supplements:  D Past antihistamines/decongestants:  none Other past therapies:  none  Current NSAIDS/analgesics:  Excedrin Migraine (rarely) Current triptans:  rizatriptan  5mg  (makes her sleepy) Current ergotamine:  none Current anti-emetic:  none Current muscle relaxants:  none Current Antihypertensive medications:  none Current Antidepressant medications:  none Current Anticonvulsant medications:  none Current anti-CGRP:  Ajovy , Nurtec PRN Current Vitamins/Herbal/Supplements:  D Current Antihistamines/Decongestants:  none Other therapy:  none Birth control:  none Other medications:  levothyroxine    Caffeine:  occasional black tea.  No coffee Alcohol:  rare Diet:  drinks 48-56 oz water  daily.  Protein (eggs, chicken, salmon, shrimp), salads, fruit.  Limits fried foods and beef. Exercise:  walks Depression:  no; Anxiety:  some work-related stress.  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   Sleep hygiene:  Sleeps from 9 PM to 5:45 AM uninterrupted.  Sometimes feels fatigued during the day.  Diagnosed with mild sleep apnea.    History of head trauma/concussion:  no Family history of headache:  mom, sister Family history of aneurysm:  maternal uncle      PAST MEDICAL HISTORY: Past Medical History:  Diagnosis Date   Arthritis    Constipation    Graves disease  Hyperlipidemia    Hypothyroidism    IBS  (irritable bowel syndrome)    Migraine    Pre-diabetes    Thyroid  disease    Vitamin D  deficiency     PAST SURGICAL HISTORY: Past Surgical History:  Procedure Laterality Date   BIOPSY  10/24/2022   Procedure: BIOPSY;  Surgeon: Junie Olds, MD;  Location: [facility];  Service: General;;   CESAREAN SECTION     COLONOSCOPY     DILATION AND EVACUATION     ESOPHAGOGASTRODUODENOSCOPY N/A 10/24/2022   Procedure: ESOPHAGOGASTRODUODENOSCOPY (EGD);  Surgeon: Junie Olds, MD;  Location: [facility];  Service: General;  Laterality: N/A;   UPPER GI ENDOSCOPY N/A 12/15/2022   Procedure: UPPER GI ENDOSCOPY;  Surgeon: Junie Olds, MD;  Location: [facility];  Service: General;  Laterality: N/A;    MEDICATIONS: Current Outpatient Medications on File Prior to Visit  Medication Sig Dispense Refill   Fremanezumab -vfrm (AJOVY ) 225 MG/1.5ML SOAJ Inject 225 mg into the skin every 30 (thirty) days. 1.5 mL 3   levothyroxine  (SYNTHROID ) 125 MCG tablet Take 1 tablet (125 mcg total) by mouth in the morning on an empty stomach 90 tablet 3   linaclotide  (LINZESS ) 290 MCG CAPS capsule Take 1 capsule (290 mcg) by mouth daily before breakfast. 90 capsule 3   propranolol  (INDERAL ) 20 MG tablet Take 1 tablet (20 mg total) by mouth 3 (three) times daily. 90 tablet 3   Rimegepant Sulfate  (NURTEC) 75 MG TBDP Take 1 tablet by mouth daily as needed. Max dose in 24 hours is 1 tablet. 8 tablet 2   rizatriptan  (MAXALT ) 5 MG tablet Take 1 tablet (5 mg total) by mouth as needed for migraine. May repeat in 2 hours if needed. Do not combine with Nurtec as directed. 10 tablet 2   Vitamin D , Ergocalciferol , (DRISDOL ) 1.25 MG (50000 UNIT) CAPS capsule Take 1 capsule by mouth once a week. 12 capsule 3   No current facility-administered medications on file prior to visit.    ALLERGIES: Allergies  Allergen Reactions   Simvastatin Other (See Comments)    fatigue   Tetanus-Diphth-Acell Pertussis Swelling     FAMILY HISTORY: Family History  Problem Relation Age of Onset   Diabetes Mother    Cancer Mother        MMT Cancer   Obesity Mother    Throat cancer Father    Sudden death Father    Stroke Father    Migraines Sister    Healthy Sister    Healthy Sister    Healthy Brother    Healthy Brother    Healthy Brother    Healthy Son     Objective:  Blood pressure 113/80, pulse 96, height 5' 4 (1.626 m), weight 199 lb (90.3 kg), SpO2 99%. General: No acute distress.  Patient appears well-groomed.   Head:  Normocephalic/atraumatic Eyes:  fundi examined but not visualized Neck: supple, no paraspinal tenderness, full range of motion Heart: regular rate and rhythm Neurological Exam: Mental status: alert and oriented to person, place, and time, speech fluent and not dysarthric, language intact. Cranial nerves: CN I: not tested CN II: pupils equal, round and reactive to light, visual fields intact CN III, IV, VI:  full range of motion, no nystagmus, no ptosis CN V: facial sensation intact. CN VII: upper and lower face symmetric CN VIII: hearing intact CN IX, X: gag intact, uvula midline CN XI: sternocleidomastoid and trapezius muscles intact CN XII: tongue midline Bulk & Tone: normal, no  fasciculations. Motor:  muscle strength 5/5 throughout Sensation:  Pinprick, temperature and vibratory sensation intact. Deep Tendon Reflexes:  2+ throughout,  toes downgoing.   Finger to nose testing:  Without dysmetria.   Heel to shin:  Without dysmetria.   Gait:  Normal station and stride.  Romberg negative.    Thank you for allowing me to take part in the care of this patient.  Janne Members, DO  CC: Vyvyan Sun, MD

## 2024-06-14 ENCOUNTER — Ambulatory Visit: Admitting: Neurology

## 2024-06-14 ENCOUNTER — Encounter: Payer: Self-pay | Admitting: Neurology

## 2024-06-14 ENCOUNTER — Other Ambulatory Visit (HOSPITAL_COMMUNITY): Payer: Self-pay

## 2024-06-14 VITALS — BP 113/80 | HR 96 | Ht 64.0 in | Wt 199.0 lb

## 2024-06-14 DIAGNOSIS — G43101 Migraine with aura, not intractable, with status migrainosus: Secondary | ICD-10-CM

## 2024-06-14 MED ORDER — AIMOVIG 140 MG/ML ~~LOC~~ SOAJ
140.0000 mg | SUBCUTANEOUS | 11 refills | Status: DC
  Filled 2024-06-14 – 2024-06-20 (×2): qty 1, 28d supply, fill #0
  Filled 2024-07-27: qty 1, 28d supply, fill #1
  Filled 2024-09-06: qty 1, 28d supply, fill #2

## 2024-06-14 MED ORDER — ONDANSETRON 4 MG PO TBDP
4.0000 mg | ORAL_TABLET | Freq: Three times a day (TID) | ORAL | 5 refills | Status: DC | PRN
  Filled 2024-06-14: qty 20, 7d supply, fill #0

## 2024-06-14 MED ORDER — RIZATRIPTAN BENZOATE 10 MG PO TBDP
10.0000 mg | ORAL_TABLET | ORAL | 5 refills | Status: AC | PRN
Start: 2024-06-14 — End: ?
  Filled 2024-06-14: qty 10, 14d supply, fill #0
  Filled 2024-09-06: qty 10, 14d supply, fill #1

## 2024-06-14 NOTE — Patient Instructions (Addendum)
  Plan to change from Ajovy  to Aimovig injection every 28 days.  If no improvement in 3 months, contact me Take Ubrelvy at earliest onset of headache.  May repeat dose once in 2 hours if needed.  Maximum 2 tablets in 24 hours.  Let me know if it works If you are at home, try the higher dose of rizatriptan  (10mg ) Ondansetron  for nausea Limit use of pain relievers to no more than 9 days out of the month.  These medications include acetaminophen , NSAIDs (ibuprofen/Advil/Motrin, naproxen/Aleve, triptans (Imitrex /sumatriptan ), Excedrin, and narcotics.  This will help reduce risk of rebound headaches. Be aware of common food triggers Routine exercise Stay adequately hydrated (aim for 64 oz water  daily) Keep headache diary Maintain proper stress management Maintain proper sleep hygiene Do not skip meals

## 2024-06-14 NOTE — Progress Notes (Signed)
 Medication Samples have been provided to the patient.  Drug name: Raymond Calender       Strength: 100 mg        Qty: 4  LOT: [id number]  Exp.Date: 12/26  Dosing instructions: as needed  The patient has been instructed regarding the correct time, dose, and frequency of taking this medication, including desired effects and most common side effects.   Michalene Agee 3:38 PM 06/14/2024

## 2024-06-15 ENCOUNTER — Other Ambulatory Visit: Payer: Self-pay

## 2024-06-15 ENCOUNTER — Other Ambulatory Visit (HOSPITAL_COMMUNITY): Payer: Self-pay

## 2024-06-15 DIAGNOSIS — G43009 Migraine without aura, not intractable, without status migrainosus: Secondary | ICD-10-CM | POA: Diagnosis not present

## 2024-06-15 DIAGNOSIS — E890 Postprocedural hypothyroidism: Secondary | ICD-10-CM | POA: Diagnosis not present

## 2024-06-15 DIAGNOSIS — Z903 Acquired absence of stomach [part of]: Secondary | ICD-10-CM | POA: Diagnosis not present

## 2024-06-15 DIAGNOSIS — E559 Vitamin D deficiency, unspecified: Secondary | ICD-10-CM | POA: Diagnosis not present

## 2024-06-15 MED ORDER — VITAMIN D (ERGOCALCIFEROL) 1.25 MG (50000 UNIT) PO CAPS
50000.0000 [IU] | ORAL_CAPSULE | ORAL | 3 refills | Status: AC
  Filled 2024-06-15: qty 12, 84d supply, fill #0
  Filled 2024-09-30: qty 12, 84d supply, fill #1
  Filled 2025-01-02: qty 12, 84d supply, fill #2

## 2024-06-16 ENCOUNTER — Other Ambulatory Visit (HOSPITAL_COMMUNITY): Payer: Self-pay

## 2024-06-17 ENCOUNTER — Telehealth: Payer: Self-pay

## 2024-06-17 NOTE — Telephone Encounter (Signed)
 PA needed for Aimovig 140 mg

## 2024-06-18 ENCOUNTER — Other Ambulatory Visit (HOSPITAL_COMMUNITY): Payer: Self-pay

## 2024-06-20 ENCOUNTER — Other Ambulatory Visit (HOSPITAL_COMMUNITY): Payer: Self-pay

## 2024-06-21 ENCOUNTER — Telehealth: Payer: Self-pay | Admitting: Pharmacy Technician

## 2024-06-21 NOTE — Telephone Encounter (Signed)
 PA has been submitted, and telephone encounter has been created. Please see telephone encounter dated 6.24.25.

## 2024-06-21 NOTE — Telephone Encounter (Signed)
 Pharmacy Patient Advocate Encounter   Received notification from Pt Calls Messages that prior authorization for AIMOVIG  140MG  is required/requested.   Insurance verification completed.   The patient is insured through [facility] .   Per test claim: PA required; PA submitted to above mentioned insurance via CoverMyMeds Key/confirmation #/EOC [id number] Status is pending

## 2024-06-22 ENCOUNTER — Other Ambulatory Visit (HOSPITAL_COMMUNITY): Payer: Self-pay

## 2024-06-23 ENCOUNTER — Other Ambulatory Visit: Payer: Self-pay

## 2024-06-27 ENCOUNTER — Other Ambulatory Visit: Payer: Self-pay

## 2024-06-27 ENCOUNTER — Other Ambulatory Visit (HOSPITAL_COMMUNITY): Payer: Self-pay

## 2024-06-27 ENCOUNTER — Other Ambulatory Visit: Payer: Self-pay | Admitting: Neurology

## 2024-06-27 ENCOUNTER — Encounter (HOSPITAL_BASED_OUTPATIENT_CLINIC_OR_DEPARTMENT_OTHER): Payer: Self-pay

## 2024-06-27 ENCOUNTER — Other Ambulatory Visit (HOSPITAL_BASED_OUTPATIENT_CLINIC_OR_DEPARTMENT_OTHER): Payer: Self-pay

## 2024-06-27 ENCOUNTER — Telehealth: Payer: Self-pay

## 2024-06-27 MED ORDER — UBRELVY 100 MG PO TABS
1.0000 | ORAL_TABLET | ORAL | 11 refills | Status: DC | PRN
  Filled 2024-06-27: qty 16, 30d supply, fill #0
  Filled 2024-09-06: qty 16, 30d supply, fill #1

## 2024-06-28 ENCOUNTER — Telehealth (HOSPITAL_COMMUNITY): Payer: Self-pay

## 2024-06-28 ENCOUNTER — Other Ambulatory Visit (HOSPITAL_COMMUNITY): Payer: Self-pay

## 2024-06-28 ENCOUNTER — Telehealth: Payer: Self-pay | Admitting: Pharmacy Technician

## 2024-06-28 NOTE — Telephone Encounter (Signed)
 Pharmacy Patient Advocate Encounter   Received notification from Pt Calls Messages that prior authorization for Ubrelvy 100MG  tablets  is required/requested.   Insurance verification completed.   The patient is insured through [facility] .   Per test claim: PA required; PA submitted to above mentioned insurance via CoverMyMeds Key/confirmation #/EOC [facility] Status is pending

## 2024-06-28 NOTE — Telephone Encounter (Signed)
 PA has been submitted, and telephone encounter has been created. Please see telephone encounter dated 7.1.25.

## 2024-06-28 NOTE — Telephone Encounter (Signed)
 PA request has been Submitted. New Encounter has been or will be created for follow up. For additional info see Pharmacy Prior Auth telephone encounter from 06/28/24.

## 2024-06-29 ENCOUNTER — Other Ambulatory Visit (HOSPITAL_COMMUNITY): Payer: Self-pay

## 2024-06-29 NOTE — Telephone Encounter (Signed)
 Pharmacy Patient Advocate Encounter  Received notification from [facility] that Prior Authorization for Ubrelvy 100MG  tablets  has been APPROVED from 06/29/24 to 12/26/24. Ran test claim, Copay is $0. This test claim was processed through [facility]- copay amounts may vary at other pharmacies due to pharmacy/plan contracts, or as the patient moves through the different stages of their insurance plan.   PA #/Case ID/Reference #: [id number]

## 2024-06-29 NOTE — Telephone Encounter (Signed)
 Pharmacy Patient Advocate Encounter  Received notification from [facility] that Prior Authorization for AIMOVIG  140MG  has been APPROVED from 6.24.25 to 12.21.25   PA #/Case ID/Reference #: [id number]

## 2024-07-18 ENCOUNTER — Ambulatory Visit: Admitting: Neurology

## 2024-07-20 NOTE — Telephone Encounter (Signed)
 Completed in previous encounter. Closing.

## 2024-07-26 ENCOUNTER — Other Ambulatory Visit (HOSPITAL_COMMUNITY): Payer: Self-pay

## 2024-07-26 DIAGNOSIS — T63441A Toxic effect of venom of bees, accidental (unintentional), initial encounter: Secondary | ICD-10-CM | POA: Diagnosis not present

## 2024-07-26 MED ORDER — PREDNISONE 20 MG PO TABS
ORAL_TABLET | ORAL | 0 refills | Status: AC
Start: 2024-07-26 — End: ?
  Filled 2024-07-26: qty 18, 9d supply, fill #0

## 2024-08-17 DIAGNOSIS — H524 Presbyopia: Secondary | ICD-10-CM | POA: Diagnosis not present

## 2024-08-17 DIAGNOSIS — H5213 Myopia, bilateral: Secondary | ICD-10-CM | POA: Diagnosis not present

## 2024-09-29 ENCOUNTER — Encounter: Payer: Self-pay | Admitting: Neurology

## 2024-09-30 ENCOUNTER — Other Ambulatory Visit: Payer: Self-pay | Admitting: Neurology

## 2024-09-30 ENCOUNTER — Other Ambulatory Visit (HOSPITAL_COMMUNITY): Payer: Self-pay

## 2024-09-30 MED ORDER — QULIPTA 60 MG PO TABS
60.0000 mg | ORAL_TABLET | Freq: Every day | ORAL | 5 refills | Status: DC
  Filled 2024-09-30 – 2024-10-04 (×2): qty 30, 30d supply, fill #0
  Filled 2024-11-01: qty 30, 30d supply, fill #1
  Filled 2024-12-04: qty 30, 30d supply, fill #2

## 2024-10-04 ENCOUNTER — Other Ambulatory Visit (HOSPITAL_COMMUNITY): Payer: Self-pay

## 2024-10-04 ENCOUNTER — Telehealth (HOSPITAL_COMMUNITY): Payer: Self-pay

## 2024-10-04 ENCOUNTER — Telehealth: Payer: Self-pay | Admitting: Pharmacy Technician

## 2024-10-04 ENCOUNTER — Encounter (HOSPITAL_COMMUNITY): Payer: Self-pay

## 2024-10-04 NOTE — Telephone Encounter (Signed)
 Pharmacy Patient Advocate Encounter   Received notification from Pt Calls Messages that prior authorization for QULIPTA 60MG  is required/requested.   Insurance verification completed.   The patient is insured through [facility].   Per test claim: PA required; PA submitted to above mentioned insurance via Latent Key/confirmation #/EOC [id number] Status is pending

## 2024-10-04 NOTE — Telephone Encounter (Signed)
 Pharmacy Patient Advocate Encounter  Received notification from [facility] that Prior Authorization for QULIPTA 60MG  has been APPROVED from 10.7.25 to 4.6.26. Ran test claim, Copay is $0. This test claim was processed through [facility]- copay amounts may vary at other pharmacies due to pharmacy/plan contracts, or as the patient moves through the different stages of their insurance plan.   PA #/Case ID/Reference #: [id number]

## 2024-10-04 NOTE — Telephone Encounter (Signed)
 PA has been submitted, and telephone encounter has been created. Please see telephone encounter dated 10.7.25.

## 2024-11-02 ENCOUNTER — Other Ambulatory Visit (HOSPITAL_COMMUNITY): Payer: Self-pay

## 2024-11-02 DIAGNOSIS — N644 Mastodynia: Secondary | ICD-10-CM | POA: Diagnosis not present

## 2024-11-02 DIAGNOSIS — N6320 Unspecified lump in the left breast, unspecified quadrant: Secondary | ICD-10-CM | POA: Diagnosis not present

## 2024-11-02 MED ORDER — AMOXICILLIN-POT CLAVULANATE 875-125 MG PO TABS
ORAL_TABLET | ORAL | 0 refills | Status: DC
  Filled 2024-11-02: qty 14, 7d supply, fill #0

## 2024-11-03 ENCOUNTER — Other Ambulatory Visit: Payer: Self-pay | Admitting: Obstetrics & Gynecology

## 2024-11-03 DIAGNOSIS — N644 Mastodynia: Secondary | ICD-10-CM

## 2024-11-16 ENCOUNTER — Ambulatory Visit
Admission: RE | Admit: 2024-11-16 | Discharge: 2024-11-16 | Disposition: A | Source: Ambulatory Visit | Attending: Obstetrics & Gynecology | Admitting: Obstetrics & Gynecology

## 2024-11-16 ENCOUNTER — Other Ambulatory Visit (HOSPITAL_COMMUNITY): Payer: Self-pay

## 2024-11-16 ENCOUNTER — Ambulatory Visit
Admission: RE | Admit: 2024-11-16 | Discharge: 2024-11-16 | Disposition: A | Discharge status: Home / Self Care | Source: Ambulatory Visit | Attending: Obstetrics & Gynecology | Admitting: Obstetrics & Gynecology

## 2024-11-16 ENCOUNTER — Other Ambulatory Visit: Payer: Self-pay | Admitting: Obstetrics & Gynecology

## 2024-11-16 DIAGNOSIS — N644 Mastodynia: Secondary | ICD-10-CM

## 2024-11-16 DIAGNOSIS — N6325 Unspecified lump in the left breast, overlapping quadrants: Secondary | ICD-10-CM | POA: Diagnosis not present

## 2024-11-16 DIAGNOSIS — R928 Other abnormal and inconclusive findings on diagnostic imaging of breast: Secondary | ICD-10-CM

## 2024-11-16 MED ORDER — AMOXICILLIN-POT CLAVULANATE 875-125 MG PO TABS
1.0000 | ORAL_TABLET | Freq: Two times a day (BID) | ORAL | 0 refills | Status: AC
  Filled 2024-11-16: qty 20, 10d supply, fill #0

## 2024-12-05 ENCOUNTER — Other Ambulatory Visit

## 2024-12-13 NOTE — Progress Notes (Unsigned)
 NEUROLOGY FOLLOW UP OFFICE NOTE  Susan Shields [id number]  Assessment/Plan:   Migraine with aura, without status migrainosus, not intractable  Migraine prevention:  Qulipta  60mg  daily Migraine rescue:  Ubrelvy  100mg , Zofran  ODT 4mg  for nausea. Limit use of pain relievers to no more than 9 days out of the month to prevent risk of rebound or medication-overuse headache. Keep headache diary Discussed lifestyle modification and option for vitamins/supplements Follow up 6 months.    Subjective:  Susan Shields is a 43 year old right-handed female with Graves disease s/p acquired hypothyroidism and IBS who follows up for migraines.  UPDATE: Aimovig  ineffective.  Started Qulipta . *** Intensity:  *** Duration:  *** with Ubrelvy  Frequency:  *** Frequency of abortive medication: *** Current NSAIDS/analgesics:  Excedrin Migraine (rarely) Current triptans:  rizatriptan  5mg  (makes her sleepy) Current ergotamine:  none Current anti-emetic:  none Current muscle relaxants:  none Current Antihypertensive medications:  none Current Antidepressant medications:  none Current Anticonvulsant medications:  none Current anti-CGRP:  Qulipta  60mg  daily, Ubrelvy  100mg  Current Vitamins/Herbal/Supplements:  D Current Antihistamines/Decongestants:  none Other therapy:  none Birth control:  none Other medications:  levothyroxine    Caffeine:  occasional black tea.  No coffee Alcohol:  rare Diet:  drinks 48-56 oz water  daily.  Protein (eggs, chicken, salmon, shrimp), salads, fruit.  Limits fried foods and beef. Exercise:  walks Depression:  no; Anxiety:  some work-related stress.  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   Sleep hygiene:  Sleeps from 9 PM to 5:45 AM uninterrupted.  Sometimes feels fatigued during the day.  Diagnosed with mild sleep apnea.    HISTORY: Onset:  2005, after giving birth to her son.  Worsened in 2023 with increased stress associated with her son going  away to college. Location:  bilateral temporal, bilateral occipital Quality:  pressure, sharp in back of head Intensity:  10/10, 6-7/10 (on Ajovy ).   Aura:  occasionally black dots in her vision or fuzzy vision Prodrome:  dizziness Associated symptoms:  Nausea, photophobia, vertigo.  She denies associated vomiting, phonophobia, neck pain, unilateral numbness or weakness. Duration:  all day Frequency:  6 days a month Frequency of abortive medication: 6 days a month Triggers:  stress/anxiety Relieving factors:  sitting in dark room (can't lie down if with vertigo) Activity:  aggravates  07/22/2022 MRI BRAIN W WO:  Rare foci of T2 hyperintensity within the white matter of the cerebral hemispheres, nonspecific, may be related to migraines given provided history. Differential diagnosis include demyelinating disease, vasculitis and sequela of prior inflammatory/infectious processes.  Past NSAIDS/analgesics:  meloxicam, Toradol , Tylenol , Aleve Past abortive triptans:  sumatriptan  tab Past abortive ergotamine:  none Past muscle relaxants:  none Past anti-emetic:  Zofran  Past antihypertensive medications:  propranolol  Past antidepressant medications:  nortriptyline  Past anticonvulsant medications:  topiramate , gabapentin  Past anti-CGRP:  Aimovig , Ajovy , Nurtec PRN Past vitamins/Herbal/Supplements:  D Past antihistamines/decongestants:  none Other past therapies:  none    History of head trauma/concussion:  no Family history of headache:  mom, sister Family history of aneurysm:  maternal uncle  PAST MEDICAL HISTORY: Past Medical History:  Diagnosis Date   Arthritis    Constipation    Graves disease    Hyperlipidemia    Hypothyroidism    IBS (irritable bowel syndrome)    Migraine    Pre-diabetes    Thyroid  disease    Vitamin D  deficiency     MEDICATIONS: Medications Ordered Prior to Encounter[1]  ALLERGIES: Allergies[2]  FAMILY HISTORY: Family  History  Problem Relation  Age of Onset   Seizures Mother    Diabetes Mother    Cancer Mother        MMT Cancer   Obesity Mother    Throat cancer Father    Sudden death Father    Stroke Father    Migraines Sister    Healthy Sister    Healthy Brother    Healthy Brother    Healthy Brother    Healthy Son       Objective:  *** General: No acute distress.  Patient appears ***-groomed.   Head:  Normocephalic/atraumatic Eyes:  Fundi examined but not visualized Neck: supple, no paraspinal tenderness, full range of motion Heart:  Regular rate and rhythm Neurological Exam: alert and oriented.  Speech fluent and not dysarthric, language intact.  CN II-XII intact. Bulk and tone normal, muscle strength 5/5 throughout.  Sensation to light touch intact.  Deep tendon reflexes 2+ throughout, toes downgoing.  Finger to nose testing intact.  Gait normal, Romberg negative.   [name], DO  CC: ***          [1]  Current Outpatient Medications on File Prior to Visit  Medication Sig Dispense Refill   Atogepant  (QULIPTA ) 60 MG TABS Take 1 tablet (60 mg total) by mouth daily. 30 tablet 5   levothyroxine  (SYNTHROID ) 125 MCG tablet Take 1 tablet (125 mcg total) by mouth in the morning on an empty stomach 90 tablet 3   linaclotide  (LINZESS ) 290 MCG CAPS capsule Take 1 capsule (290 mcg) by mouth daily before breakfast. 90 capsule 3   ondansetron  (ZOFRAN -ODT) 4 MG disintegrating tablet Dissolve 1 tablet (4 mg total) by mouth every 8 (eight) hours as needed. 20 tablet 5   predniSONE  (DELTASONE ) 20 MG tablet Take 3 tablets daily for 3 days, 2 tablets daily for 3 days then 1 tablet daily for 3 days 18 tablet 0   rizatriptan  (MAXALT -MLT) 10 MG disintegrating tablet Take 1 tablet (10 mg total) by mouth as needed for migraine. May repeat in 2 hours if needed.  Maximum 2 tablets in 24 hours. 10 tablet 5   Ubrogepant  (UBRELVY ) 100 MG TABS Take 1 tablet (100 mg total) by mouth as needed. May repeat after 2 hours.  Maximum 2 tablets  in 24 hours. 16 tablet 11   Vitamin D , Ergocalciferol , (DRISDOL ) 1.25 MG (50000 UNIT) CAPS capsule Take 1 capsule by mouth once a week. 12 capsule 3   Vitamin D , Ergocalciferol , (DRISDOL ) 1.25 MG (50000 UNIT) CAPS capsule Take 1 capsule (50,000 Units total) by mouth once a week. 12 capsule 3   No current facility-administered medications on file prior to visit.  [2]  Allergies Allergen Reactions   Simvastatin Other (See Comments)    fatigue   Tetanus-Diphth-Acell Pertussis Swelling

## 2024-12-14 ENCOUNTER — Other Ambulatory Visit (HOSPITAL_COMMUNITY): Payer: Self-pay

## 2024-12-14 ENCOUNTER — Encounter: Payer: Self-pay | Admitting: Neurology

## 2024-12-14 ENCOUNTER — Ambulatory Visit: Admitting: Neurology

## 2024-12-14 VITALS — BP 127/80 | HR 80 | Ht 64.0 in | Wt 200.2 lb

## 2024-12-14 DIAGNOSIS — G43101 Migraine with aura, not intractable, with status migrainosus: Secondary | ICD-10-CM

## 2024-12-14 MED ORDER — QULIPTA 60 MG PO TABS
60.0000 mg | ORAL_TABLET | Freq: Every day | ORAL | 5 refills | Status: AC
  Filled 2024-12-14 – 2025-01-02 (×2): qty 30, 30d supply, fill #0
  Filled 2025-01-31: qty 90, 90d supply, fill #1

## 2024-12-14 MED ORDER — RIZATRIPTAN BENZOATE 10 MG PO TBDP
10.0000 mg | ORAL_TABLET | ORAL | 5 refills | Status: AC | PRN
  Filled 2024-12-14 – 2024-12-19 (×2): qty 10, 14d supply, fill #0

## 2024-12-14 MED ORDER — ONDANSETRON 4 MG PO TBDP
4.0000 mg | ORAL_TABLET | Freq: Three times a day (TID) | ORAL | 5 refills | Status: AC | PRN
  Filled 2024-12-14 – 2024-12-19 (×2): qty 20, 7d supply, fill #0

## 2024-12-14 MED ORDER — UBRELVY 100 MG PO TABS
1.0000 | ORAL_TABLET | ORAL | 5 refills | Status: AC | PRN
  Filled 2024-12-14 – 2024-12-19 (×2): qty 16, 30d supply, fill #0

## 2024-12-15 ENCOUNTER — Other Ambulatory Visit (HOSPITAL_COMMUNITY): Payer: Self-pay

## 2024-12-16 ENCOUNTER — Other Ambulatory Visit (HOSPITAL_COMMUNITY): Payer: Self-pay

## 2024-12-16 DIAGNOSIS — E890 Postprocedural hypothyroidism: Secondary | ICD-10-CM | POA: Diagnosis not present

## 2024-12-16 DIAGNOSIS — G43009 Migraine without aura, not intractable, without status migrainosus: Secondary | ICD-10-CM | POA: Diagnosis not present

## 2024-12-16 DIAGNOSIS — R7303 Prediabetes: Secondary | ICD-10-CM | POA: Diagnosis not present

## 2024-12-16 DIAGNOSIS — N951 Menopausal and female climacteric states: Secondary | ICD-10-CM | POA: Diagnosis not present

## 2024-12-16 DIAGNOSIS — E7800 Pure hypercholesterolemia, unspecified: Secondary | ICD-10-CM | POA: Diagnosis not present

## 2024-12-16 MED ORDER — LEVOTHYROXINE SODIUM 125 MCG PO TABS
125.0000 ug | ORAL_TABLET | Freq: Every morning | ORAL | 3 refills | Status: AC
  Filled 2024-12-16: qty 90, 90d supply, fill #0

## 2024-12-17 ENCOUNTER — Other Ambulatory Visit (HOSPITAL_COMMUNITY): Payer: Self-pay

## 2024-12-19 ENCOUNTER — Telehealth: Payer: Self-pay | Admitting: Neurology

## 2024-12-19 ENCOUNTER — Other Ambulatory Visit (HOSPITAL_COMMUNITY): Payer: Self-pay

## 2024-12-19 NOTE — Telephone Encounter (Signed)
 Request to cancel appointment, pt states not needed

## 2024-12-20 ENCOUNTER — Other Ambulatory Visit (HOSPITAL_COMMUNITY): Payer: Self-pay

## 2024-12-27 ENCOUNTER — Other Ambulatory Visit (HOSPITAL_COMMUNITY): Payer: Self-pay

## 2025-01-02 ENCOUNTER — Other Ambulatory Visit: Payer: Self-pay

## 2025-01-02 ENCOUNTER — Other Ambulatory Visit (HOSPITAL_BASED_OUTPATIENT_CLINIC_OR_DEPARTMENT_OTHER): Payer: Self-pay

## 2025-01-02 ENCOUNTER — Ambulatory Visit: Admitting: Adult Health

## 2025-01-11 ENCOUNTER — Other Ambulatory Visit (HOSPITAL_COMMUNITY): Payer: Self-pay

## 2025-01-20 ENCOUNTER — Encounter: Payer: Self-pay | Admitting: Gastroenterology

## 2025-01-27 ENCOUNTER — Other Ambulatory Visit: Payer: Self-pay

## 2025-01-27 ENCOUNTER — Ambulatory Visit

## 2025-01-27 VITALS — Ht 64.0 in | Wt 197.0 lb

## 2025-01-27 DIAGNOSIS — Z1211 Encounter for screening for malignant neoplasm of colon: Secondary | ICD-10-CM

## 2025-01-27 MED ORDER — NA SULFATE-K SULFATE-MG SULF 17.5-3.13-1.6 GM/177ML PO SOLN
1.0000 | Freq: Once | ORAL | 0 refills | Status: AC
  Filled 2025-01-27: qty 354, 1d supply, fill #0

## 2025-01-27 NOTE — Progress Notes (Signed)
 Pre visit completed via video call; Patient verified name, DOB, and address; No egg or soy allergy known to patient  No issues known to pt with past sedation with any surgeries or procedures; Patient denies ever being told they had issues or difficulty with intubation;  No FH of Malignant Hyperthermia; Pt is not on diet pills; Pt is not on home 02;  Pt is not on blood thinners;  Pt reports issues with constipation - taking Linzess  daily for constipation prevention/relief; RN advised patient to increase oral fluids, activity ( as tolerated), and fruits/veggies that are allowed for 5 days prior to procedure;  No A fib or A flutter Have any cardiac testing pending--NO Insurance verified during PV appt--- Susan Shields Pt can ambulate without assistance;  Pt denies use of chewing tobacco Discussed diabetic/weight loss medication holds; Discussed NSAID holds; Checked BMI to be less than 50; Pt instructed to use Single[employer]com or GoodRx for a price reduction on prep  Patient's chart reviewed by Susan Shields CNRA prior to previsit and patient appropriate for the [facility].  Pre visit completed and red dot placed by patient's name on their procedure day (on provider's schedule).   Instructions sent to MyChart per patient request;

## 2025-01-28 ENCOUNTER — Other Ambulatory Visit (HOSPITAL_COMMUNITY): Payer: Self-pay

## 2025-01-30 ENCOUNTER — Other Ambulatory Visit (HOSPITAL_COMMUNITY): Payer: Self-pay

## 2025-01-31 ENCOUNTER — Other Ambulatory Visit: Payer: Self-pay

## 2025-01-31 ENCOUNTER — Encounter (HOSPITAL_COMMUNITY): Payer: Self-pay

## 2025-01-31 ENCOUNTER — Other Ambulatory Visit (HOSPITAL_COMMUNITY): Payer: Self-pay

## 2025-01-31 MED ORDER — ADDYI 100 MG PO TABS
100.0000 mg | ORAL_TABLET | Freq: Every day | ORAL | 2 refills | Status: AC
  Filled 2025-01-31: qty 30, 30d supply, fill #0

## 2025-02-01 ENCOUNTER — Encounter: Payer: Self-pay | Admitting: Gastroenterology

## 2025-02-01 ENCOUNTER — Other Ambulatory Visit: Payer: Self-pay

## 2025-02-10 ENCOUNTER — Encounter: Admitting: Gastroenterology

## 2025-02-17 ENCOUNTER — Ambulatory Visit: Admitting: Gastroenterology

## 2025-06-27 ENCOUNTER — Ambulatory Visit: Payer: Self-pay | Admitting: Neurology
